# Patient Record
Sex: Male | Born: 1935 | Race: White | Hispanic: No | Marital: Married | Smoking: Former smoker
Health system: Southern US, Community
[De-identification: ages and names within clinical notes are randomized; demographics above are authoritative.]

## PROBLEM LIST (undated history)

## (undated) DIAGNOSIS — I517 Cardiomegaly: Secondary | ICD-10-CM

## (undated) DIAGNOSIS — M5136 Other intervertebral disc degeneration, lumbar region: Secondary | ICD-10-CM

## (undated) DIAGNOSIS — R0609 Other forms of dyspnea: Secondary | ICD-10-CM

## (undated) DIAGNOSIS — N400 Enlarged prostate without lower urinary tract symptoms: Secondary | ICD-10-CM

## (undated) DIAGNOSIS — J449 Chronic obstructive pulmonary disease, unspecified: Secondary | ICD-10-CM

## (undated) DIAGNOSIS — L570 Actinic keratosis: Secondary | ICD-10-CM

## (undated) DIAGNOSIS — I10 Essential (primary) hypertension: Secondary | ICD-10-CM

## (undated) DIAGNOSIS — Z87891 Personal history of nicotine dependence: Secondary | ICD-10-CM

## (undated) DIAGNOSIS — E785 Hyperlipidemia, unspecified: Secondary | ICD-10-CM

## (undated) DIAGNOSIS — Z87442 Personal history of urinary calculi: Secondary | ICD-10-CM

## (undated) DIAGNOSIS — R609 Edema, unspecified: Secondary | ICD-10-CM

## (undated) DIAGNOSIS — I700 Atherosclerosis of aorta: Secondary | ICD-10-CM

## (undated) DIAGNOSIS — J189 Pneumonia, unspecified organism: Secondary | ICD-10-CM

## (undated) DIAGNOSIS — R600 Localized edema: Secondary | ICD-10-CM

## (undated) DIAGNOSIS — M1990 Unspecified osteoarthritis, unspecified site: Secondary | ICD-10-CM

## (undated) DIAGNOSIS — Z7901 Long term (current) use of anticoagulants: Secondary | ICD-10-CM

## (undated) DIAGNOSIS — M51369 Other intervertebral disc degeneration, lumbar region without mention of lumbar back pain or lower extremity pain: Secondary | ICD-10-CM

## (undated) DIAGNOSIS — N529 Male erectile dysfunction, unspecified: Secondary | ICD-10-CM

## (undated) DIAGNOSIS — K5790 Diverticulosis of intestine, part unspecified, without perforation or abscess without bleeding: Secondary | ICD-10-CM

## (undated) DIAGNOSIS — I509 Heart failure, unspecified: Secondary | ICD-10-CM

## (undated) DIAGNOSIS — R972 Elevated prostate specific antigen [PSA]: Secondary | ICD-10-CM

## (undated) DIAGNOSIS — R001 Bradycardia, unspecified: Secondary | ICD-10-CM

## (undated) DIAGNOSIS — I4891 Unspecified atrial fibrillation: Secondary | ICD-10-CM

## (undated) DIAGNOSIS — I2510 Atherosclerotic heart disease of native coronary artery without angina pectoris: Secondary | ICD-10-CM

## (undated) HISTORY — PX: CORONARY ARTERY BYPASS GRAFT: SHX141

## (undated) HISTORY — DX: Heart failure, unspecified: I50.9

## (undated) HISTORY — PX: CHOLECYSTECTOMY: SHX55

## (undated) HISTORY — PX: BYPASS AXILLA/BRACHIAL ARTERY: SHX6426

## (undated) HISTORY — DX: Actinic keratosis: L57.0

## (undated) HISTORY — PX: HIP SURGERY: SHX245

---

## 2004-12-11 HISTORY — PX: TOTAL HIP ARTHROPLASTY: SHX124

## 2005-10-25 ENCOUNTER — Ambulatory Visit: Payer: Self-pay | Admitting: Orthopaedic Surgery

## 2005-11-24 ENCOUNTER — Inpatient Hospital Stay: Payer: Self-pay | Admitting: General Practice

## 2007-10-17 ENCOUNTER — Ambulatory Visit: Payer: Self-pay | Admitting: Internal Medicine

## 2007-10-23 ENCOUNTER — Ambulatory Visit: Payer: Self-pay | Admitting: Internal Medicine

## 2009-05-26 ENCOUNTER — Other Ambulatory Visit: Payer: Self-pay | Admitting: Ophthalmology

## 2011-12-26 ENCOUNTER — Emergency Department: Payer: Self-pay | Admitting: *Deleted

## 2011-12-26 LAB — COMPREHENSIVE METABOLIC PANEL
Albumin: 4.5 g/dL (ref 3.4–5.0)
Alkaline Phosphatase: 59 U/L (ref 50–136)
Bilirubin,Total: 0.6 mg/dL (ref 0.2–1.0)
Calcium, Total: 8.7 mg/dL (ref 8.5–10.1)
Co2: 29 mmol/L (ref 21–32)
Creatinine: 1.18 mg/dL (ref 0.60–1.30)
EGFR (Non-African Amer.): >60
Glucose: 131 mg/dL — ABNORMAL HIGH (ref 65–99)
Osmolality: 294 (ref 275–301)
SGPT (ALT): 16 U/L
Sodium: 144 mmol/L (ref 136–145)
Total Protein: 6.9 g/dL (ref 6.4–8.2)

## 2011-12-26 LAB — CBC
HCT: 42.9 % (ref 40.0–52.0)
HGB: 14.3 g/dL (ref 13.0–18.0)
MCV: 91 fL (ref 80–100)
RDW: 14.2 % (ref 11.5–14.5)
WBC: 11.9 10*3/uL — ABNORMAL HIGH (ref 3.8–10.6)

## 2011-12-26 LAB — TROPONIN I: Troponin-I: <0.02 ng/mL

## 2012-05-02 ENCOUNTER — Inpatient Hospital Stay: Payer: Self-pay | Admitting: Cardiology

## 2012-05-02 ENCOUNTER — Other Ambulatory Visit: Payer: Self-pay | Admitting: Cardiology

## 2012-05-02 DIAGNOSIS — I214 Non-ST elevation (NSTEMI) myocardial infarction: Secondary | ICD-10-CM

## 2012-05-02 HISTORY — DX: Non-ST elevation (NSTEMI) myocardial infarction: I21.4

## 2012-05-02 LAB — CBC WITH DIFFERENTIAL/PLATELET
Basophil #: 0.1 10*3/uL (ref 0.0–0.1)
Basophil: 1 %
Comment - H1-Com1: NORMAL
Eosinophil #: 0.2 10*3/uL (ref 0.0–0.7)
Eosinophil %: 1.7 %
Eosinophil: 1 %
HCT: 44.3 % (ref 40.0–52.0)
HGB: 15 g/dL (ref 13.0–18.0)
Lymphocyte #: 2 10*3/uL (ref 1.0–3.6)
Lymphocyte %: 21.2 %
Lymphocytes: 18 %
MCHC: 33.8 g/dL (ref 32.0–36.0)
MCV: 88 fL (ref 80–100)
Monocyte #: 0.6 x10 3/mm (ref 0.2–1.0)
Monocyte %: 5.9 %
Monocytes: 7 %
Neutrophil #: 6.6 10*3/uL — ABNORMAL HIGH (ref 1.4–6.5)
Neutrophil %: 70.3 %
Platelet: 273 10*3/uL (ref 150–440)
RDW: 14.1 % (ref 11.5–14.5)
Variant Lymphocyte - H1-Rlymph: 9 %
WBC: 9.4 10*3/uL (ref 3.8–10.6)

## 2012-05-02 LAB — TROPONIN I
Troponin-I: 0.52 ng/mL — ABNORMAL HIGH
Troponin-I: 1.3 ng/mL — ABNORMAL HIGH

## 2012-05-02 LAB — CK TOTAL AND CKMB (NOT AT ARMC): CK-MB: 15.5 ng/mL — ABNORMAL HIGH (ref 0.5–3.6)

## 2012-05-03 DIAGNOSIS — I5030 Unspecified diastolic (congestive) heart failure: Secondary | ICD-10-CM

## 2012-05-03 DIAGNOSIS — I2510 Atherosclerotic heart disease of native coronary artery without angina pectoris: Secondary | ICD-10-CM

## 2012-05-03 HISTORY — DX: Unspecified diastolic (congestive) heart failure: I50.30

## 2012-05-03 HISTORY — DX: Atherosclerotic heart disease of native coronary artery without angina pectoris: I25.10

## 2012-05-03 LAB — CBC WITH DIFFERENTIAL/PLATELET
Basophil #: 0.1 10*3/uL (ref 0.0–0.1)
Eosinophil %: 3.3 %
Eosinophil: 2 %
HCT: 43.2 % (ref 40.0–52.0)
HGB: 14.1 g/dL (ref 13.0–18.0)
Lymphocyte %: 18.9 %
Lymphocytes: 22 %
MCH: 29.2 pg (ref 26.0–34.0)
MCHC: 32.7 g/dL (ref 32.0–36.0)
Monocyte %: 9.5 %
Monocytes: 9 %
Neutrophil #: 6.6 10*3/uL — ABNORMAL HIGH (ref 1.4–6.5)
Neutrophil %: 67.6 %
Platelet: 252 10*3/uL (ref 150–440)
RBC: 4.84 10*6/uL (ref 4.40–5.90)
RDW: 14.1 % (ref 11.5–14.5)

## 2012-05-03 LAB — BASIC METABOLIC PANEL
Anion Gap: 8 (ref 7–16)
Chloride: 106 mmol/L (ref 98–107)
Co2: 29 mmol/L (ref 21–32)
EGFR (African American): >60
Glucose: 89 mg/dL (ref 65–99)
Osmolality: 286 (ref 275–301)
Potassium: 3.7 mmol/L (ref 3.5–5.1)

## 2012-05-13 DIAGNOSIS — Z951 Presence of aortocoronary bypass graft: Secondary | ICD-10-CM

## 2012-05-13 HISTORY — DX: Presence of aortocoronary bypass graft: Z95.1

## 2012-05-13 HISTORY — PX: OTHER SURGICAL HISTORY: SHX169

## 2012-06-14 ENCOUNTER — Encounter: Payer: Self-pay | Admitting: Cardiology

## 2012-07-11 ENCOUNTER — Encounter: Payer: Self-pay | Admitting: Cardiology

## 2014-02-05 ENCOUNTER — Inpatient Hospital Stay: Payer: Self-pay | Admitting: Internal Medicine

## 2014-02-05 LAB — COMPREHENSIVE METABOLIC PANEL
ALT: 253 U/L — AB (ref 12–78)
ANION GAP: 5 — AB (ref 7–16)
AST: 181 U/L — AB (ref 15–37)
Albumin: 3.8 g/dL (ref 3.4–5.0)
Alkaline Phosphatase: 212 U/L — ABNORMAL HIGH
BUN: 14 mg/dL (ref 7–18)
Bilirubin,Total: 5.4 mg/dL — ABNORMAL HIGH (ref 0.2–1.0)
CHLORIDE: 106 mmol/L (ref 98–107)
CREATININE: 1.02 mg/dL (ref 0.60–1.30)
Calcium, Total: 8.3 mg/dL — ABNORMAL LOW (ref 8.5–10.1)
Co2: 27 mmol/L (ref 21–32)
EGFR (African American): >60
EGFR (Non-African Amer.): >60
GLUCOSE: 108 mg/dL — AB (ref 65–99)
OSMOLALITY: 277 (ref 275–301)
POTASSIUM: 3.6 mmol/L (ref 3.5–5.1)
Sodium: 138 mmol/L (ref 136–145)
TOTAL PROTEIN: 6.6 g/dL (ref 6.4–8.2)

## 2014-02-05 LAB — URINALYSIS, COMPLETE
Bacteria: NONE SEEN
Blood: NEGATIVE
Glucose,UR: NEGATIVE mg/dL (ref 0–75)
Ketone: NEGATIVE
Leukocyte Esterase: NEGATIVE
NITRITE: NEGATIVE
Ph: 5 (ref 4.5–8.0)
Protein: NEGATIVE
RBC,UR: 1 /HPF (ref 0–5)
Specific Gravity: 1.015 (ref 1.003–1.030)
WBC UR: 1 /HPF (ref 0–5)

## 2014-02-05 LAB — CBC
HCT: 44.7 % (ref 40.0–52.0)
HGB: 15.3 g/dL (ref 13.0–18.0)
MCH: 30.8 pg (ref 26.0–34.0)
MCHC: 34.3 g/dL (ref 32.0–36.0)
MCV: 90 fL (ref 80–100)
Platelet: 232 10*3/uL (ref 150–440)
RBC: 4.97 10*6/uL (ref 4.40–5.90)
RDW: 14.3 % (ref 11.5–14.5)
WBC: 9.2 10*3/uL (ref 3.8–10.6)

## 2014-02-05 LAB — TROPONIN I: Troponin-I: <0.02 ng/mL

## 2014-02-05 LAB — LIPASE, BLOOD: Lipase: >10000 U/L — ABNORMAL HIGH (ref 73–393)

## 2014-02-06 LAB — COMPREHENSIVE METABOLIC PANEL
ALBUMIN: 3.4 g/dL (ref 3.4–5.0)
Alkaline Phosphatase: 223 U/L — ABNORMAL HIGH
Anion Gap: 10 (ref 7–16)
BUN: 11 mg/dL (ref 7–18)
Bilirubin,Total: 6.4 mg/dL — ABNORMAL HIGH (ref 0.2–1.0)
CHLORIDE: 105 mmol/L (ref 98–107)
Calcium, Total: 8.4 mg/dL — ABNORMAL LOW (ref 8.5–10.1)
Co2: 25 mmol/L (ref 21–32)
Creatinine: 0.95 mg/dL (ref 0.60–1.30)
EGFR (Non-African Amer.): >60
Glucose: 72 mg/dL (ref 65–99)
OSMOLALITY: 277 (ref 275–301)
Potassium: 3.5 mmol/L (ref 3.5–5.1)
SGOT(AST): 144 U/L — ABNORMAL HIGH (ref 15–37)
SGPT (ALT): 208 U/L — ABNORMAL HIGH (ref 12–78)
SODIUM: 140 mmol/L (ref 136–145)
TOTAL PROTEIN: 6.2 g/dL — AB (ref 6.4–8.2)

## 2014-02-06 LAB — PROTIME-INR
INR: 1.1
PROTHROMBIN TIME: 14.1 s (ref 11.5–14.7)

## 2014-02-06 LAB — CBC WITH DIFFERENTIAL/PLATELET
BASOS PCT: 0.7 %
Basophil #: 0 10*3/uL (ref 0.0–0.1)
EOS ABS: 0.1 10*3/uL (ref 0.0–0.7)
EOS PCT: 1.8 %
HCT: 42.2 % (ref 40.0–52.0)
HGB: 14.1 g/dL (ref 13.0–18.0)
Lymphocyte #: 1 10*3/uL (ref 1.0–3.6)
Lymphocyte %: 15.8 %
MCH: 29.8 pg (ref 26.0–34.0)
MCHC: 33.5 g/dL (ref 32.0–36.0)
MCV: 89 fL (ref 80–100)
MONO ABS: 0.9 x10 3/mm (ref 0.2–1.0)
Monocyte %: 14.6 %
NEUTROS ABS: 4.1 10*3/uL (ref 1.4–6.5)
NEUTROS PCT: 67.1 %
PLATELETS: 223 10*3/uL (ref 150–440)
RBC: 4.74 10*6/uL (ref 4.40–5.90)
RDW: 14.1 % (ref 11.5–14.5)
WBC: 6.1 10*3/uL (ref 3.8–10.6)

## 2014-02-06 LAB — LIPASE, BLOOD: LIPASE: 1445 U/L — AB (ref 73–393)

## 2014-02-06 LAB — MAGNESIUM: Magnesium: 1.8 mg/dL

## 2014-02-06 LAB — BILIRUBIN, DIRECT: Bilirubin, Direct: 4.6 mg/dL — ABNORMAL HIGH (ref 0.00–0.20)

## 2014-02-07 LAB — CBC WITH DIFFERENTIAL/PLATELET
BASOS PCT: 0.7 %
Basophil #: 0 10*3/uL (ref 0.0–0.1)
EOS ABS: 0.2 10*3/uL (ref 0.0–0.7)
Eosinophil %: 2.6 %
HCT: 39.7 % — AB (ref 40.0–52.0)
HGB: 13.9 g/dL (ref 13.0–18.0)
Lymphocyte #: 0.9 10*3/uL — ABNORMAL LOW (ref 1.0–3.6)
Lymphocyte %: 12.6 %
MCH: 31.4 pg (ref 26.0–34.0)
MCHC: 35.1 g/dL (ref 32.0–36.0)
MCV: 89 fL (ref 80–100)
Monocyte #: 1 x10 3/mm (ref 0.2–1.0)
Monocyte %: 13.6 %
NEUTROS ABS: 4.9 10*3/uL (ref 1.4–6.5)
Neutrophil %: 70.5 %
Platelet: 210 10*3/uL (ref 150–440)
RBC: 4.44 10*6/uL (ref 4.40–5.90)
RDW: 14 % (ref 11.5–14.5)
WBC: 7 10*3/uL (ref 3.8–10.6)

## 2014-02-07 LAB — COMPREHENSIVE METABOLIC PANEL
ALT: 172 U/L — AB (ref 12–78)
ANION GAP: 8 (ref 7–16)
Albumin: 3.4 g/dL (ref 3.4–5.0)
Alkaline Phosphatase: 220 U/L — ABNORMAL HIGH
BILIRUBIN TOTAL: 2.8 mg/dL — AB (ref 0.2–1.0)
BUN: 15 mg/dL (ref 7–18)
CALCIUM: 8.3 mg/dL — AB (ref 8.5–10.1)
Chloride: 106 mmol/L (ref 98–107)
Co2: 25 mmol/L (ref 21–32)
Creatinine: 0.98 mg/dL (ref 0.60–1.30)
EGFR (African American): >60
EGFR (Non-African Amer.): >60
Glucose: 69 mg/dL (ref 65–99)
OSMOLALITY: 277 (ref 275–301)
POTASSIUM: 3.6 mmol/L (ref 3.5–5.1)
SGOT(AST): 123 U/L — ABNORMAL HIGH (ref 15–37)
Sodium: 139 mmol/L (ref 136–145)
TOTAL PROTEIN: 6.2 g/dL — AB (ref 6.4–8.2)

## 2014-02-07 LAB — LIPASE, BLOOD: Lipase: 642 U/L — ABNORMAL HIGH (ref 73–393)

## 2014-02-08 LAB — COMPREHENSIVE METABOLIC PANEL
Albumin: 3.2 g/dL — ABNORMAL LOW (ref 3.4–5.0)
Alkaline Phosphatase: 180 U/L — ABNORMAL HIGH
Anion Gap: 5 — ABNORMAL LOW (ref 7–16)
BILIRUBIN TOTAL: 1.8 mg/dL — AB (ref 0.2–1.0)
BUN: 15 mg/dL (ref 7–18)
Calcium, Total: 8 mg/dL — ABNORMAL LOW (ref 8.5–10.1)
Chloride: 111 mmol/L — ABNORMAL HIGH (ref 98–107)
Co2: 27 mmol/L (ref 21–32)
Creatinine: 0.88 mg/dL (ref 0.60–1.30)
Glucose: 78 mg/dL (ref 65–99)
Osmolality: 285 (ref 275–301)
Potassium: 3.4 mmol/L — ABNORMAL LOW (ref 3.5–5.1)
SGOT(AST): 104 U/L — ABNORMAL HIGH (ref 15–37)
SGPT (ALT): 144 U/L — ABNORMAL HIGH (ref 12–78)
Sodium: 143 mmol/L (ref 136–145)
Total Protein: 5.7 g/dL — ABNORMAL LOW (ref 6.4–8.2)

## 2014-02-08 LAB — LIPASE, BLOOD: Lipase: 649 U/L — ABNORMAL HIGH (ref 73–393)

## 2014-02-12 LAB — PATHOLOGY REPORT

## 2015-04-03 NOTE — Consult Note (Signed)
Pt lipase down from 10,000 to 1450.  MRCP shows CBD stone and thickened gall bladder wall. For ERCP today with Dr. Candace Cruise.  Pt said Dr. Marina Gravel to be consullted.  I will be off this weekend and Dr. Candace Cruise will cover.  Electronic Signatures: Manya Silvas (MD)  (Signed on 27-Feb-15 13:11)  Authored  Last Updated: 27-Feb-15 13:11 by Manya Silvas (MD)

## 2015-04-03 NOTE — Consult Note (Signed)
PATIENT NAME:  David Hardin, David Hardin MR#:  [id number] DATE OF BIRTH:  October 14, 1927  DATE OF CONSULTATION:  02/05/2014  CONSULTING PHYSICIAN:  Manya Silvas, MD  The patient is an 81 year old white male well known to me who is in very good health for his age. He had the onset of a few episodes of pain off and on that would come and go every few days for the last month or so. Also would occur after eating. He developed epigastric pain and pain went into his back, and it was very significant and continuous. His previous bouts of pain had resolved usually within an hour or so. This one stayed for several hours. He came to the ER where he was found to have gallstone pancreatitis and was admitted to the hospital. I was asked to see him in consultation.   At this moment in time, hours after admission, he has no abdominal pain whatsoever and feels much better.   PAST MEDICAL HISTORY: 1.  Hypertension.  2.  Hyperlipidemia.  3.  GERD.   PAST SURGICAL HISTORY: Coronary bypass graft surgery 05/13/2012, left total hip replacement, and cataract surgery.   ALLERGIES: No known drug allergies.   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   FAMILY HISTORY: Positive for heart disease.   MEDICATIONS: Metoprolol 25 mg p.o. b.i.d., Lipitor 20 mg a day, amlodipine 5 mg a day.   REVIEW OF SYSTEMS: Covered by the hospitalist's admission note. Denies any chest pain. Denies any asthma or wheezing. No nausea or vomiting. No chills or fever.   PHYSICAL EXAMINATION: VITAL SIGNS: Temperature 98.2, pulse 68, blood pressure 167/81, pulse oximetry 98% on room air.  HEENT: Sclerae are slightly icteric. Conjunctivae negative. Tongue negative. The head is atraumatic.  CHEST: Clear.  HEART: No murmurs, gallops, clicks, or rubs.  ABDOMEN: Bowel sounds present. No hepatosplenomegaly. No masses. No bruits. No epigastric tenderness.  SKIN: Warm and dry.  PSYCHIATRIC: Mood and affect are appropriate.    LABORATORY AND RADIOLOGICAL DATA: Lipase greater than 10,000. Calcium 8.3. Glucose 108, BUN 14, creatinine 1, sodium 138, potassium 3.6, chloride 106, CO2 of 27, alkaline phosphatase 212, SGOT 181, SGPT 253, total bilirubin 5.4, albumin 3.8, total protein 6.6. White count 9.2, hemoglobin 15.3, platelet count 232. Urinalysis shows 1+ bilirubin. Ultrasound of the abdomen shows a dilated common bile duct, nonmobile echogenic foci noted with associated gallbladder wall thickening, and sludge and multiple echogenic mobile foci noted in the sludge; this is consistent with stones. CAT scan of the abdomen shows gallstone, some gallbladder wall thickening, mild increased caliber of the common bile duct, cannot rule out a small stone within the ampulla.   ASSESSMENT: Gallstones with probable gallstone pancreatitis. He may have passed a stone, which would explain why he is having no significant pain at this time or tenderness.   PLAN: Is to get an MRCP in the morning, repeat liver panel in the morning, and if he has evidence of common bile duct stone, will get ERCP, stone removal, and surgical consultation.   ____________________________ Manya Silvas, MD rte:jcm D: 02/05/2014 20:37:21 ET T: 02/05/2014 20:50:40 ET JOB#: [id number]  cc: Manya Silvas, MD, <Dictator> Vipul S. Manuella Ghazi, MD Manya Silvas MD ELECTRONICALLY SIGNED 02/19/2014 16:40

## 2015-04-03 NOTE — Discharge Summary (Signed)
PATIENT NAME:  David Hardin, David Hardin MR#:  [id number] DATE OF BIRTH:  [birth year]  DATE OF ADMISSION:  02/05/2014 DATE OF DISCHARGE:  02/08/2014  PRIMARY CARE PHYSICIAN: Dr. Baldemar Lenis  CONSULTANTS: Gastroenterology, Dr. Candace Cruise; surgery, Dr. Marina Gravel.  PROCEDURE: Laparoscopic cholecystectomy.   DISCHARGE DIAGNOSES: 1.  Choledocholithiasis. 2.  Gallstone pancreatitis.  3.  Hypertension.  4.  Hyperlipidemia.   CONDITION: Stable.   CODE STATUS: FULL.  DISCHARGE MEDICATIONS: Please refer to the [facility] physician discharge instruction medication reconciliation list.   DISCHARGE DIET: Low-sodium, low-fat, low-cholesterol.  DISCHARGE ACTIVITY: As tolerated.   FOLLOWUP CARE: Follow up with PCP within 1 to 2 weeks. Follow up with Dr. Sherri Rad in 1 to 2 weeks.  REASON FOR ADMISSION: Abdominal pain.   HOSPITAL COURSE: The patient is an 81 year old Caucasian male with a history of hypertension and hyperlipidemia who presented to the ED with abdominal pain for 1 week. The patient was noted to have elevated lipase, more than 10,000, and elevated liver function tests. For detailed history and physical examination, please refer to the admission note dictated by Dr. Manuella Ghazi. On admission date, abdominal ultrasound showed 9.9 mm CBD sludge and multiple stones with gallbladder wall thickening up to 3.5 mm. Cholecystitis could not be excluded.   Acute gallstone pancreatitis: After admission, the patient was kept n.p.o. with IV fluid support and pain management. The patient's abdominal pain has improved. The ERCP  showed calculus without cholecystitis. ERCP also showed single distal CBD stone that was extracted. After biliary therapy and balloon extraction, the patient's liver function has been improving. Dr. Marina Gravel suggests the patient has choledocholithiasis and did lap cholecystectomy today. He suggested the patient may be discharged to home after the surgery. The patient has no complaints after surgery. The patient's lipase  decreased to 49 today. He is clinically stable. He will be discharged to home today. I discussed the patient's discharge plan with the patient, the patient's wife and daughter, and Dr. Marina Gravel.   TIME SPENT: About 38 minutes.   ____________________________ Demetrios Loll, MD qc:sb D: 02/08/2014 13:44:50 ET T: 02/09/2014 08:05:32 ET JOB#: [id number]  cc: Demetrios Loll, MD, <Dictator> Demetrios Loll MD ELECTRONICALLY SIGNED 02/09/2014 17:45

## 2015-04-03 NOTE — Consult Note (Signed)
Pt had cholecystectomy this AM. Sleepy from anesthesia. No other complaints. LFT continues to improve. Will be discharged to home today. Will sign off. THanks  Electronic Signatures: Verdie Shire (MD)  (Signed on 01-Mar-15 11:43)  Authored  Last Updated: 01-Mar-15 11:43 by Verdie Shire (MD)

## 2015-04-03 NOTE — H&P (Signed)
PATIENT NAME:  David Hardin, David Hardin MR#:  [id number] DATE OF BIRTH:  1927/03/27  DATE OF ADMISSION:  02/05/2014  PRIMARY CARE PHYSICIAN:  Dr. Baldemar Lenis  REFERRING PHYSICIAN:  Dr. Benjaman Lobe   CHIEF COMPLAINT: Abdominal pain.   HISTORY OF PRESENT ILLNESS: The patient is an 81 year old male with a known history of hypertension and coronary artery disease, is being admitted for possible gallstone pancreatitis. The patient started having abdominal pain, which started for about a week, on and off, mainly in the epigastric region. He was thinking it had been reflux, but was not getting resolved, and family forced him to come to the Emergency Department. While in the ED, he was found to have a lipase more than 10,000, and elevated LFTs, for which he is requested to be admitted for further evaluation and management. The patient does not have any pain at this time, and is requesting food, as he is very hungry.  PAST MEDICAL HISTORY: 1.  Hypertension.  2.  Hyperlipidemia.  3.  GERD.    PAST SURGICAL HISTORY:  1.  CABG.  2.  Left total hip replacement.  3.  Cataract surgery.   ALLERGIES: No known drug allergies.   SOCIAL HISTORY: He is married. No alcohol use. 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    FAMILY HISTORY: Father died at the age of 79 from heart failure.  MEDICATIONS AT HOME: 1.  Vitamin once daily. 2.  Metoprolol 25 mg p.o. b.i.d.  3.  Lipitor 20 mg p.o. daily.  4.  Amlodipine 5 mg p.o. daily.   REVIEW OF SYSTEMS: CONSTITUTIONAL: No fever, fatigue, weakness. EYES:  No blurred or double vision. ENT: No tinnitus or ear pain.  RESPIRATORY: No cough, wheezing, hemoptysis.  CARDIOVASCULAR: No chest pain, orthopnea, edema.  GASTROINTESTINAL: No nausea or vomiting. Positive abdominal pain, which is improving. GENITOURINARY:  No dysuria or hematuria. ENDOCRINE: No polyuria or nocturia.  HEMATOLOGY:  No (Dictation Anomaly)  bruising.  SKIN: No rash or lesion.  MUSCULOSKELETAL: No arthritis or muscle cramp.  NEUROLOGIC: No tingling, numbness, weakness.  PSYCHIATRIC: No history of anxiety or depression.   PHYSICAL EXAMINATION: VITAL SIGNS: Temperature 97.5, heart rate 73 per minute, respirations 18 per minute, blood pressure 160/74 mmHg, he is saturating 96% on room air.  GENERAL: The patient is an 81 year old male lying in the bed comfortably, without any acute distress.  EYES: Pupils equal, round, reactive to light. Corneas show no scleral icterus. Extraocular muscles intact.  HEENT: Head atraumatic, normocephalic. Oropharynx and nasopharynx clear.  NECK: Supple. No jugular venous distention.  LUNGS: Clear to auscultation bilaterally. No wheezing, rales, rhonchi, crepitation.  CARDIOVASCULAR: S1, S2 normal. No murmurs.   ABDOMEN: Soft, obese, nontender, nondistended. Bowel sounds present. No organomegaly or mass.  EXTREMITIES: No pedal edema, cyanosis or clubbing.  NEUROLOGIC: Nonfocal examination. Cranial nerves II through XII intact. Muscle strength 5/5 in all extremities. Sensation intact. PSYCHIATRIC:  The patient is alert and oriented x 3.  SKIN: No obvious rash, lesion, ulcer.  LABORATORY PANEL: Normal BMP, except lipase of more than 10,000. LFTs showed alkaline phosphatase of 212, total bilirubin of 5.4, AST 181, ALT 253. Normal CBC. Negative troponin. Negative UA.  CT scan of the abdomen and pelvis in the ED showed normal stomach, multiple distal colonic diverticula, no acute pathology, mild hepatic steatosis and stone within the gallbladder seen, CBD measuring up to 9 mm.   Chest x-ray showed no acute cardiopulmonary disease.  Abdominal ultrasound on February 26 showed 9.9 mm CBD, sludge and multiple gallstones seen, multiple echogenic non-mobile foci present, with gallbladder wall thickening up to 3.5 mm, cholecystitis cannot be excluded.   EKG showed sinus rhythm, no major ST-T changes.    IMPRESSION AND PLAN: 1.  Gallstone pancreatitis. Will get GI and Surgery consult. Check amylase and lipase again in the morning. Obtain LFTs and CBC recheck. Also get MRI. Start him on clear liquid diet, and will advance  as tolerated.   2.  Hypertension. Will resume his metoprolol.  3.  Hyperlipidemia. Continue Lipitor.   4.  Elevated liver function tests, likely due to gallstone. Will monitor.   5.  Code status:  FULL CODE.   Total time taking care of this patient is 40 minutes.    ____________________________ Lucina Mellow. Manuella Ghazi, MD vss:mr D: 02/05/2014 18:57:50 ET T: 02/05/2014 19:15:37 ET JOB#: [id number]  cc: Lonetta Blassingame S. Manuella Ghazi, MD, <Dictator> Derinda Late, MD  Lucina Mellow [facility] MD ELECTRONICALLY SIGNED 02/11/2014 21:32

## 2015-04-03 NOTE — Op Note (Signed)
PATIENT NAME:  David Hardin, David Hardin MR#:  [id number] DATE OF BIRTH:  [birth year]  DATE OF PROCEDURE:  02/08/2014  PREOPERATIVE DIAGNOSIS: Choledocholithiasis and biliary pancreatitis with cholelithiasis.   POSTOPERATIVE DIAGNOSIS: Choledocholithiasis and biliary pancreatitis with cholelithiasis.   PROCEDURE PERFORMED: Laparoscopic cholecystectomy.   SURGEON: Asmaa Tirpak A. Marina Gravel, M.D.   ASSISTANT: None.   ANESTHESIA: General endotracheal.   FINDINGS: Stones and what appeared to be chronic cholecystitis.   SPECIMENS: Gallbladder with contents to pathology.   ESTIMATED BLOOD LOSS: Minimal.   DESCRIPTION OF PROCEDURE: With informed consent obtained from the patient, he was brought to the operating room and positioned supine. General oroendotracheal anesthesia was induced. The patient's left arm was padded and tucked at his side. His abdomen was widely prepped and draped with ChloraPrep solution. Timeout was observed. A 12 mm blunt Hassan trocar was placed through an open technique through an infraumbilical transversely oriented skin incision with stay sutures being passed through the fascia. Pneumoperitoneum was established. The patient was then positioned in reverse Trendelenburg and airplane right side up. A 5 mm bladeless trocar was placed in the epigastric region followed by two 5 mm ports in the right subcostal margin laterally. Gallbladder was then grasped along its fundus, elevated towards the right shoulder and laterally, Hartmann pouch was identified, lateral traction was applied. The hepatoduodenal ligament was involved in a chronic scarified reaction. Dissection demonstrated single cystic artery and single cystic duct. A critical view of safety cholecystectomy was achieved. The cystic artery appeared to be anterior and slightly lateral to the cystic duct. The cystic artery was doubly clipped on the portal side, singly clipped on the gallbladder side, and divided. The cystic duct was triply clipped on  the portal side, singly clipped on the gallbladder side, and divided. One small peritoneal band which appeared to contain a small blood vessel was divided with single hemoclips and sharp scissors. Further dissection in this area demonstrated no evidence of aberrant bile duct or artery. The gallbladder was then retrieved off the gallbladder fossa utilizing hook cautery apparatus, placed into an Endo Catch device and retrieved. During the retrieving process, a 5 mm operating trocar was inserted in the epigastric port demonstrating no evidence of bowel injury or bleeding from the umbilical trocar site. The right upper quadrant was irrigated with approximately 1 liter of normal saline during the completion of the operation and hemostasis appeared to be adequate, fluid was aspirated. Ports were then removed under direct visualization. A total of 30 mL of 0.25% plain Marcaine was infiltrated along all skin and fascial incisions prior to closure. The infraumbilical fascial defect was closed with an additional figure-of-eight #0 Vicryl suture in vertical orientation. The existing stay sutures were tied to each other. 4-0 Vicryl subcuticular was applied to all skin edges followed by benzoin, Steri-Strips, Telfa, and Tegaderm. The patient was subsequently extubated and taken to the recovery room in stable and satisfactory condition by anesthesia services.  ____________________________ Jeannette How Marina Gravel, MD mab:sb D: 02/08/2014 14:12:50 ET T: 02/09/2014 08:42:12 ET JOB#: [id number]  cc: Elta Guadeloupe A. Marina Gravel, MD, <Dictator> Stanly Si A Jamicah Anstead MD ELECTRONICALLY SIGNED 02/11/2014 11:02

## 2015-04-03 NOTE — Consult Note (Signed)
Chief Complaint:  Subjective/Chief Complaint Feeling much better. Just ate breakfast. So far, ok. LFT and lipase better.   VITAL SIGNS/ANCILLARY NOTES: **Vital Signs.:   28-Feb-15 05:19  Vital Signs Type Routine  Temperature Temperature (F) 97.9  Celsius 36.6  Temperature Source oral  Pulse Pulse 71  Respirations Respirations 17  Systolic BP Systolic BP 353  Diastolic BP (mmHg) Diastolic BP (mmHg) 83  Mean BP 108  Pulse Ox % Pulse Ox % 97  Pulse Ox Activity Level  At rest  Oxygen Delivery Room Air/ 21 %   Brief Assessment:  GEN no acute distress   Cardiac Regular   Respiratory clear BS   Gastrointestinal Normal   Lab Results: Hepatic:  28-Feb-15 04:20   Bilirubin, Total  2.8  Alkaline Phosphatase  220 (45-117 NOTE: New Reference Range 10/31/13)  SGPT (ALT)  172  SGOT (AST)  123  Total Protein, Serum  6.2  Albumin, Serum 3.4  Routine Chem:  28-Feb-15 04:20   Lipase  642 (Result(s) reported on 07 Feb 2014 at 05:15AM.)  Glucose, Serum 69  BUN 15  Creatinine (comp) 0.98  Sodium, Serum 139  Potassium, Serum 3.6  Chloride, Serum 106  CO2, Serum 25  Calcium (Total), Serum  8.3  Osmolality (calc) 277  eGFR (African American) >60  eGFR (Non-African American) >60 (eGFR values <45m/min/1.73 m2 may be an indication of chronic kidney disease (CKD). Calculated eGFR is useful in patients with stable renal function. The eGFR calculation will not be reliable in acutely ill patients when serum creatinine is changing rapidly. It is not useful in  patients on dialysis. The eGFR calculation may not be applicable to patients at the low and high extremes of body sizes, pregnant women, and vegetarians.)  Anion Gap 8  Routine Hem:  28-Feb-15 04:20   WBC (CBC) 7.0  RBC (CBC) 4.44  Hemoglobin (CBC) 13.9  Hematocrit (CBC)  39.7  MCV 89  MCH 31.4  MCHC 35.1  RDW 14.0  Neutrophil % 70.5  Lymphocyte % 12.6  Monocyte % 13.6  Eosinophil % 2.6  Basophil % 0.7  Neutrophil  # 4.9  Lymphocyte #  0.9  Monocyte # 1.0  Eosinophil # 0.2  Basophil # 0.0 (Result(s) reported on 07 Feb 2014 at 05:09AM.)   Assessment/Plan:  Assessment/Plan:  Assessment Gallstone pancreatitis. CBD stone removed. Labs improving.   Plan For GB surgery tomorrow.   Electronic Signatures: OVerdie Shire(MD)  (Signed 28-Feb-15 10:06)  Authored: Chief Complaint, VITAL SIGNS/ANCILLARY NOTES, Brief Assessment, Lab Results, Assessment/Plan   Last Updated: 28-Feb-15 10:06 by OVerdie Shire(MD)

## 2015-04-03 NOTE — Consult Note (Signed)
ERCP showed single distal CBD stone that was extracted after biliary sphincterotomy and balloon extraction. Keep patient NPO rest of today. Recheck lipase/LFT in AM. thanks.  Electronic Signatures: Verdie Shire (MD)  (Signed on 27-Feb-15 15:29)  Authored  Last Updated: 27-Feb-15 15:29 by Verdie Shire (MD)

## 2015-04-03 NOTE — Consult Note (Signed)
PATIENT NAME:  David Hardin, David Hardin MR#:  [id number] DATE OF BIRTH:  [birth year]  DATE OF CONSULTATION:  02/07/2014  DATE OF DICTATION: 02/07/2014   CONSULTING PHYSICIAN:  Elta Guadeloupe A. Marina Gravel, MD  REASON FOR CONSULTATION: Choledocholithiasis and need for cholecystectomy.   HISTORY: This is a pleasant 81 year old white male with a history of coronary artery disease and high blood pressure, who was admitted to the hospital on the 26th of February with obstructive jaundice and a 1-day history of severe epigastric abdominal pain. In dating his symptoms, he states that he has had intermittent new-onset back pain, which has since resolved, dating back to as far back as 2 weeks prior to his admission. While in the Emergency Room, the patient was found to have markedly elevated lipase and liver function tests including bilirubin.  Since his hospitalization, he has not had any further pain. The patient had a dilated common bile duct seen on ultrasonography. Furthermore, a CT scan of the abdomen and pelvis was performed, demonstrating no significant findings within the pancreas but the bile duct appeared dilated.  Review of an MRCP performed the day after his admission demonstrates a 4 mm solitary stone seen within the distal common bile duct. There was gallbladder wall thickening seen as well An ERCP performed yesterday was successful in extracting the solitary stone. Cholangiography was personally reviewed by me. I did not obviously see a cystic duct on that injection. A sphincterotomy was performed. Biliary tree was swept to 12 mm. Currently, the patient is without any discomfort post ERCP. He is asking for food.  He is interested in surgical therapy to prevent future symptoms and pancreatitis.  ALLERGIES: None.   HOME MEDICATIONS:  1. Amlodipine 5 mg by mouth once a day. 2. Lipitor 20 mg by mouth once a day.  3. Metoprolol 25 mg by mouth b.i.d.   PAST MEDICAL HISTORY: Hypertension and coronary artery disease.   PAST  SURGICAL HISTORY: Left hip arthroplasty and coronary artery bypass grafting through the left chest.   SOCIAL HISTORY: The patient is very active, is involved in professional sports. Does not drink. Is married. Lives in [place].   REVIEW OF SYSTEMS: As described above. 10 point review otherwise negative.  PHYSICAL EXAMINATION:  GENERAL: The patient is a pleasant-appearing white male, much younger than stated age.  VITAL SIGNS: Temperature is 97.9, pulse is 71, respiratory rate of 18, blood pressure is 160/83.  ABDOMEN: Demonstrates it to be soft and nontender with no epigastric tenderness or Murphy sign present.  EXTREMITIES: Warm and well-perfused.  EYES: Sclerae are mildly icteric.   LABORATORY VALUES: Lipase on admission greater than 10,000, today 642. Electrolytes are unremarkable. Bilirubin on admission was 5.4, yesterday 6.4, post ERCP 2.8. Alkaline phosphatase 220, AST 123, which is diminished from admission, ALT 172, diminished from admission. White count today is 7.0, hemoglobin 13.9, platelet count 210,000, normal differential.   IMPRESSION: This is an 81 year old active white male with admitting diagnosis of acute biliary pancreatitis and concomitant choledocholithiasis. He does not have a predating history of what sounds like biliary colic.   RECOMMENDATIONS: As he is currently pain-free and his lipase is diminishing, elective laparoscopic cholecystectomy can be performed tomorrow due to the operating room schedule today, and the patient is agreeable to this. I discussed with him return to full activity in the next 2-3 weeks. I discussed with him the risks of surgery, including that of bleeding, infection, need for conversion to open operation, bile duct injury and leak. All of his  questions were answered. We had a long discussion regarding the procedure. He understands fully and wishes to proceed.   ____________________________ Jeannette How Marina Gravel, MD FACS mab:lb D: 02/07/2014 12:54:37  ET T: 02/07/2014 13:42:14 ET JOB#: [id number]  cc: Elta Guadeloupe A. Marina Gravel, MD, <Dictator> Hortencia Conradi MD ELECTRONICALLY SIGNED 02/07/2014 16:50

## 2015-04-04 NOTE — Discharge Summary (Signed)
PATIENT NAME:  David Hardin, David Hardin MR#:  [id number] DATE OF BIRTH:  Mar 29, 1927  DATE OF ADMISSION:  05/02/2012 DATE OF DISCHARGE:  05/03/2012  FINAL DIAGNOSES:  1. Non-ST elevation myocardial infarction.  2. Coronary artery disease.  3. Hypertension.   PROCEDURE: Cardiac catheterization with selective coronary arteriography on 05/03/2012.   HISTORY OF PRESENT ILLNESS: Please see admission history and physical.   HOSPITAL COURSE: The patient was admitted to telemetry on 05/02/2012 with chest pain and elevated troponin. The patient was treated with topical nitrates and Lovenox without recurrent chest pain. The patient ruled in for non-ST elevation myocardial infarction with troponin of 5.2. The patient underwent cardiac catheterization on 05/03/2012. Coronary arteriography revealed a high-grade 99% stenosis in the mid left anterior descending coronary artery with TIMI 2 flow in the LAD. There was also a 70% stenosis in the ostium of the first diagonal branch with 75% stenosis in the midsegment. In addition, there was a 99% stenosis in the distal RCA. The posterior descending artery appeared to be subtotaled with TIMI 1 flow. I lieu of the complex nature of his coronary artery disease, the patient was transferred to [facility] for complex two-vessel percutaneous coronary intervention. The patient was transferred in stable condition.    ____________________________ Isaias Cowman, MD ap:rbg D: 05/03/2012 11:56:13 ET T: 05/06/2012 10:30:27 ET JOB#: [id number]  cc: Isaias Cowman, MD, <Dictator> Isaias Cowman MD ELECTRONICALLY SIGNED 05/23/2012 17:40

## 2015-04-04 NOTE — H&P (Signed)
PATIENT NAME:  David Hardin, David Hardin MR#:  [id number] DATE OF BIRTH:  1927/07/03  DATE OF ADMISSION:  05/02/2012  PRIMARY CARE PHYSICIAN: Apolonio Schneiders, MD   CHIEF COMPLAINT: Chest pain.   HISTORY OF PRESENT ILLNESS: The patient is an 81 year old gentleman with multiple cardiovascular risk factors admitted with recurrent chest pain and positive cardiac biomarkers. The patient reports a six month history of intermittent episodes of chest discomfort. He underwent stress echocardiogram 01/26/2012 and was able to exercise 10 minutes and 15 seconds on a Bruce protocol without chest pain, ECG changes, or evidence of exercise-induced ischemia by echocardiogram. The patient reports he has been doing well until about four days ago when he noted recurrence of chest pain while walking on a treadmill. The patient had reproducible chest pain on the treadmill. The episode would generally subside with rest although he had some mild residual chest discomfort. EKG was performed which revealed evidence of new ST changes in the anterior leads. Lab work revealed elevated CPK, MB, and troponin consistent with possible non-ST elevation myocardial infarction.   PAST MEDICAL HISTORY: Hypertension.   MEDICATIONS:  1. Aspirin 81 mg daily.  2. Benicar/HCT 40/12.5 mg daily.  3. Amlodipine 5 mg daily.   SOCIAL HISTORY: The patient is married. He denies tobacco abuse. 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   FAMILY HISTORY: Father died at age 62 from congestive heart failure.   REVIEW OF SYSTEMS: CONSTITUTIONAL: No fever or chills. EYES: No blurry vision. EARS: No hearing loss. RESPIRATORY: No shortness of breath. CARDIOVASCULAR: Chest pain as described above. GI: No nausea, vomiting, diarrhea, or constipation. GU: No dysuria or hematuria. MUSCULOSKELETAL: No arthralgias or myalgias. NEUROLOGICAL: No focal muscle weakness or numbness. PSYCHOLOGICAL: No depression or  anxiety.   PHYSICAL EXAMINATION:   VITAL SIGNS: Weight 214.2, height 5 feet 8 inches, BMI 33, blood pressure 142/72 right arm, 132/80 left arm, pulse 58 and regular.   HEENT: Pupils equal and reactive to light and accommodation.   NECK: Supple without thyromegaly.   LUNGS: Clear.   HEART: Normal JVP. Normal PMI. Regular rate and rhythm. Normal S1, S2. No appreciable gallop, murmur, or rub.   ABDOMEN: Soft, nontender. Pulses were intact bilaterally.   MUSCULOSKELETAL: Normal muscle tone.   NEUROLOGIC: The patient is alert and oriented x3. Motor and sensory both grossly intact.   ACCESSORY DATA: EKG reveals sinus rhythm at 57 bpm with nonspecific ST elevations in the precordial leads.   IMPRESSION: This is an 81 year old gentleman with chest pain at rest, new ECG changes, and elevated CPK, MB, and troponin consistent with non-ST elevation myocardial infarction.   RECOMMENDATIONS:  1. Admit to telemetry.  2. Nitro paste 1 inch q.6. 3. Lovenox 1 mg/kg sub-Q q.12.  4. Proceed with cardiac catheterization with selective coronary arteriography on 05/03/2012. Risks, benefits, and alternatives were explained and informed written consent obtained.   ____________________________ Isaias Cowman, MD ap:drc D: 05/02/2012 14:17:48 ET T: 05/02/2012 14:39:25 ET JOB#: [id number] cc: Isaias Cowman, MD, <Dictator>, Vianne Bulls. Arline Asp, MD Isaias Cowman MD ELECTRONICALLY SIGNED 05/23/2012 17:40

## 2015-04-13 ENCOUNTER — Other Ambulatory Visit: Payer: Self-pay | Admitting: Specialist

## 2015-04-13 DIAGNOSIS — M5412 Radiculopathy, cervical region: Secondary | ICD-10-CM

## 2015-04-17 ENCOUNTER — Ambulatory Visit
Admission: RE | Admit: 2015-04-17 | Discharge: 2015-04-17 | Disposition: A | Discharge status: Home / Self Care | Payer: Medicare HMO | Source: Ambulatory Visit | Attending: Specialist | Admitting: Specialist

## 2015-04-17 DIAGNOSIS — M4802 Spinal stenosis, cervical region: Secondary | ICD-10-CM | POA: Diagnosis not present

## 2015-04-17 DIAGNOSIS — M5412 Radiculopathy, cervical region: Secondary | ICD-10-CM

## 2015-04-22 ENCOUNTER — Ambulatory Visit: Payer: Self-pay

## 2015-05-09 IMAGING — CT CT ABD-PELV W/ CM
2 of 5 series · 16 of 46 positions shown, 18 images · IV contrast (agent unspecified)
Comparison: 10/23/2007

CLINICAL DATA: Abdominal pain

EXAM:
CT ABDOMEN AND PELVIS WITH CONTRAST
TECHNIQUE: Multidetector CT imaging of the abdomen and pelvis was performed
using the standard protocol following bolus administration of
intravenous contrast.

[Series 2: routine abd pel with · axial · 0.90mm/px · z∈[-1116,-682]mm · 13 of 99 slices shown, 15 images]
[im 6/99  soft-tissue]
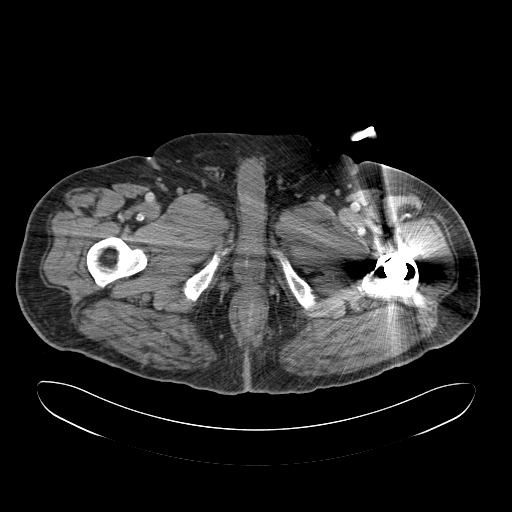
[im 6/99  bone]
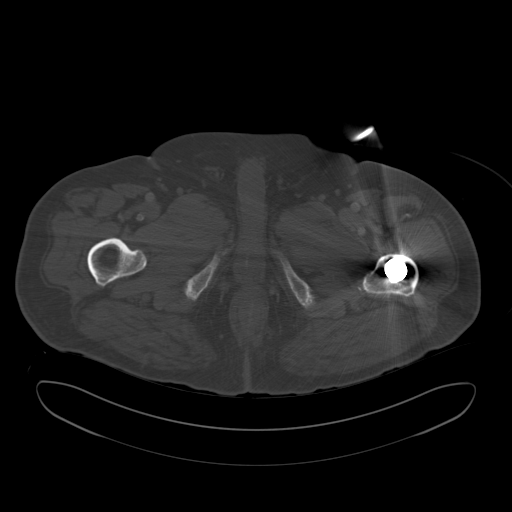
[im 11/99  soft-tissue]
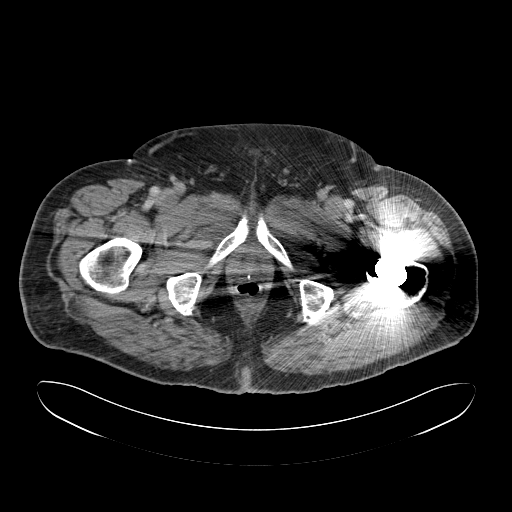
[im 22/99  soft-tissue]
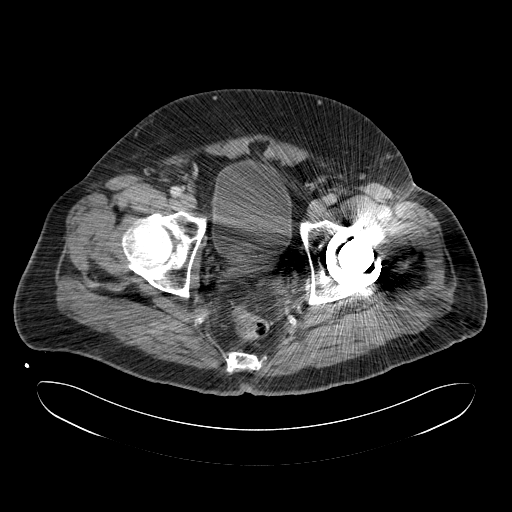
[im 28/99  soft-tissue]
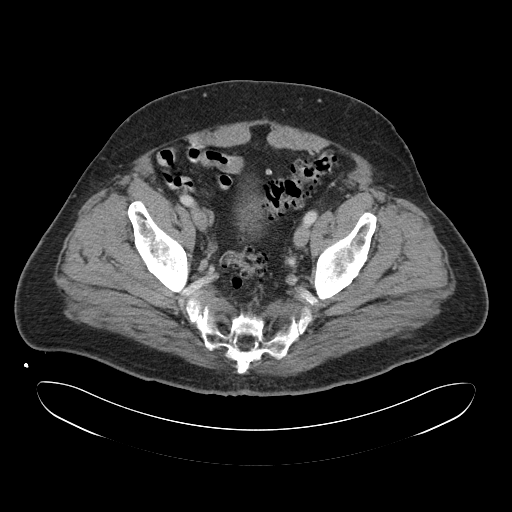
[im 33/99  soft-tissue]
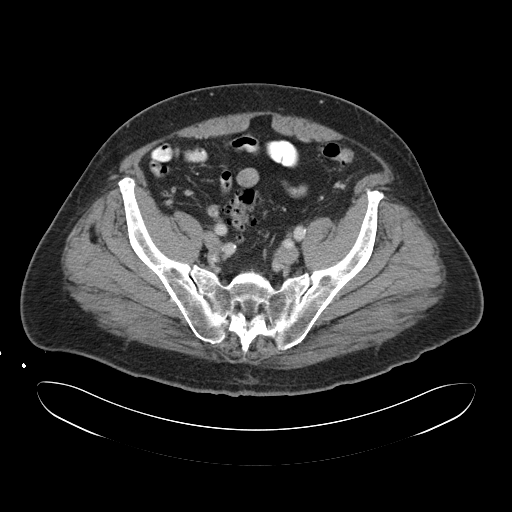
[im 44/99  soft-tissue]
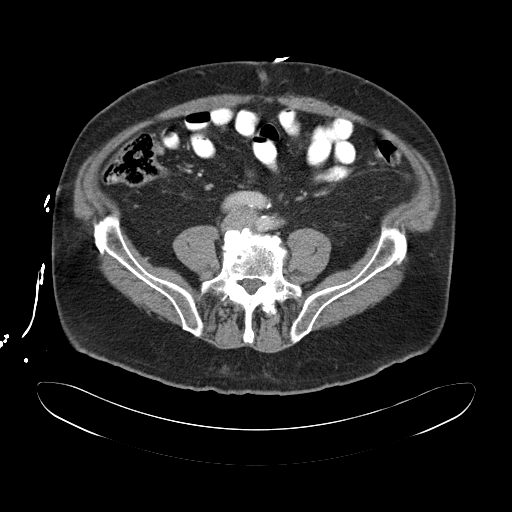
[im 50/99  soft-tissue]
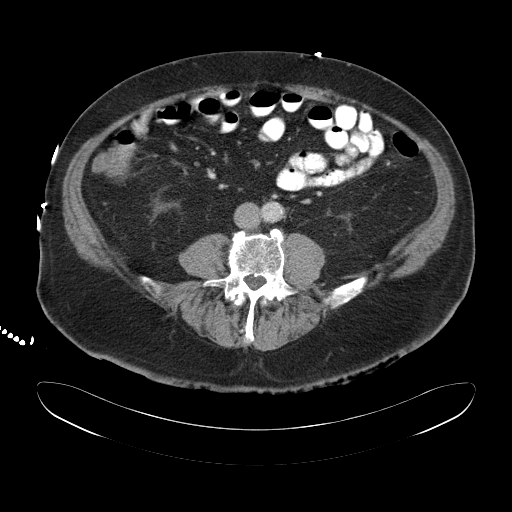
[im 55/99  soft-tissue]
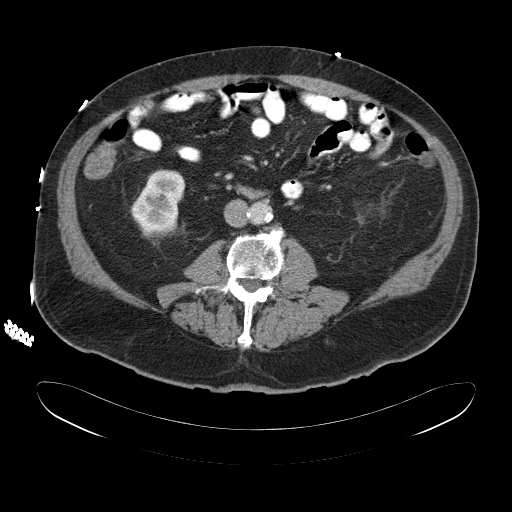
[im 66/99  soft-tissue]
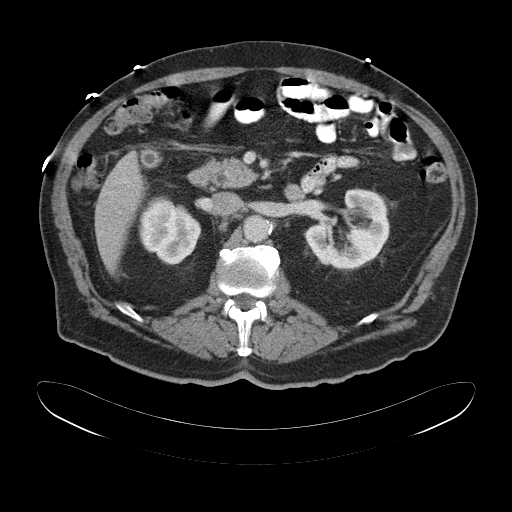
[im 66/99  bone]
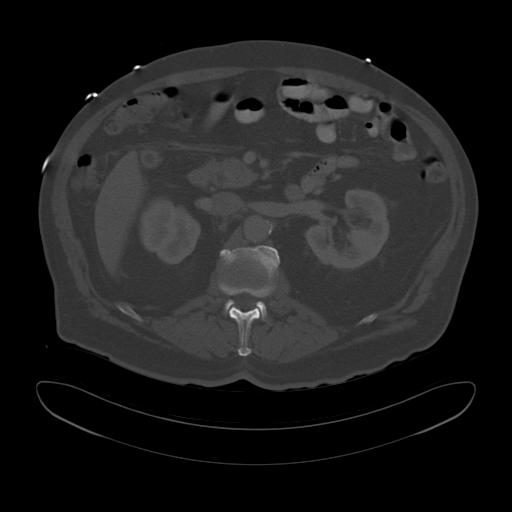
[im 71/99  soft-tissue]
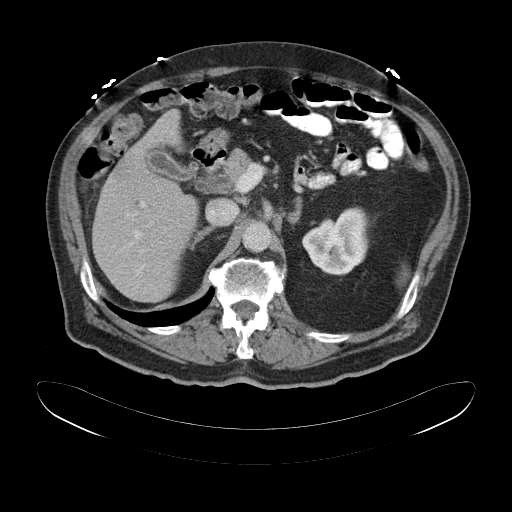
[im 77/99  soft-tissue]
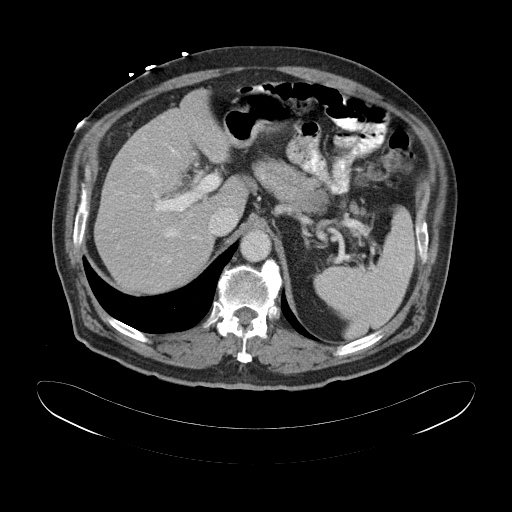
[im 88/99  soft-tissue]
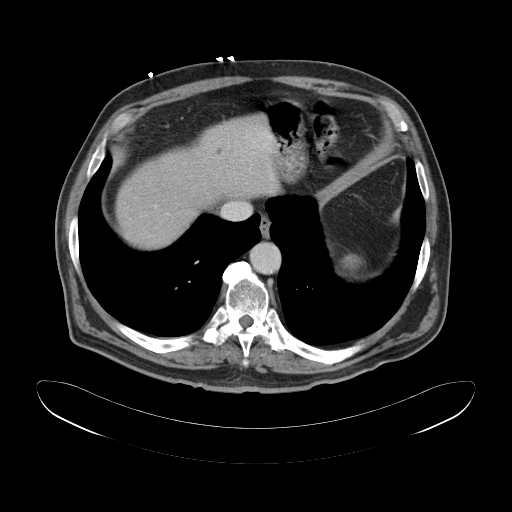
[im 93/99  soft-tissue]
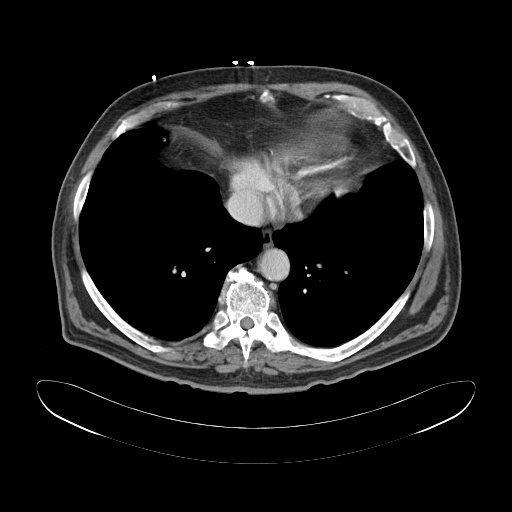

[Series 5: cor routine abd pel with · coronal · 0.85mm/px · 3 of 139 slices shown]
[im 47/139  soft-tissue]
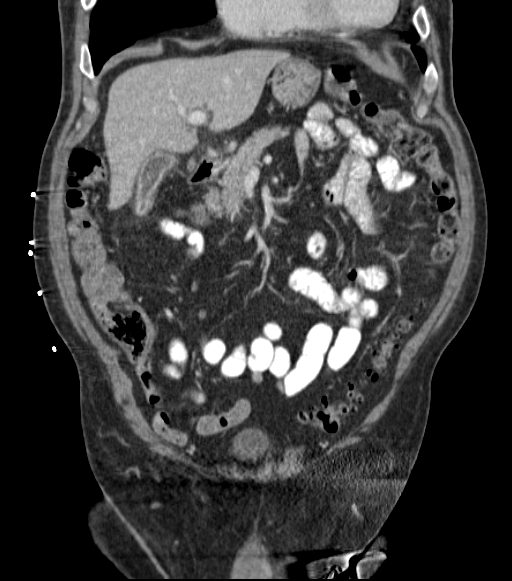
[im 62/139  soft-tissue]
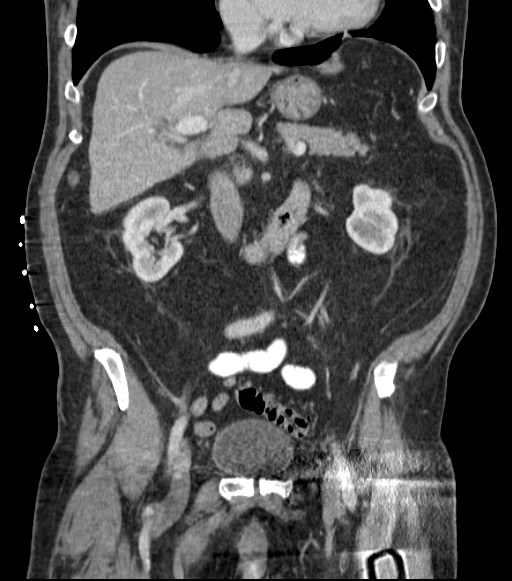
[im 77/139  soft-tissue]
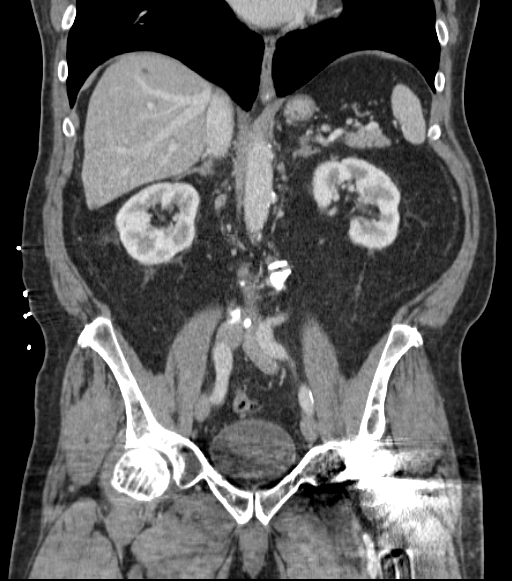

[16 of 46 positions shown; findings below may reference images not displayed]

IMPRESSION: 1. Gallstone. There may be gallbladder wall thickening as well.
Correlation with right upper quadrant sonogram is advised.
2. Mild increase caliber of the common bile duct. Cannot rule out
small stone within the ampulla. If there is a clinical concern for
choledocholithiasis or common bile duct obstruction then MRCP be
helpful for further assessment.

CONTRAST:  125 CC OF ISOVUE 370:
CONTRAST:  125 CC OF ISOVUE 370
FINDINGS: The lung bases are clear. No pleural or pericardial effusion. There
are a few low-attenuation foci within the liver parenchyma which are
too small to characterize. These appear unchanged from previous exam
and likely represent small cysts. Mild diffuse low attenuation
within the liver is noted compatible with mild hepatic steatosis.
There is a stone within the gallbladder. The gallbladder wall
appears prominent measuring. The common bile duct measures up to 9
mm. There is a tiny hyperdense structure at the expected location of
the ampulla which could represent a small stone. The pancreas
appears normal. Normal appearance of the spleen.

The adrenal glands are both normal. Normal appearance of the right
kidney. The left kidney is normal.

The urinary bladder appears normal. There is prostate gland
enlargement.

Calcified atherosclerotic disease involves the abdominal aorta.
There is no aneurysm. No upper abdominal adenopathy identified. No
pelvic or inguinal adenopathy noted.

The stomach appears normal. The small bowel loops have a normal
course and caliber without obstruction. The appendix is visualized
and appears normal. Multiple distal colonic diverticula are
identified without acute inflammation. There is no free fluid or
fluid collections within the abdomen or pelvis.

Review of the visualized bony structures is significant for L4-5 and
L5-S1 degenerative disc disease. Previous left hip arthroplasty
identified.

## 2017-12-08 DIAGNOSIS — R0602 Shortness of breath: Secondary | ICD-10-CM | POA: Diagnosis present

## 2017-12-08 DIAGNOSIS — Z87891 Personal history of nicotine dependence: Secondary | ICD-10-CM | POA: Insufficient documentation

## 2017-12-08 DIAGNOSIS — J449 Chronic obstructive pulmonary disease, unspecified: Secondary | ICD-10-CM | POA: Insufficient documentation

## 2017-12-08 DIAGNOSIS — J181 Lobar pneumonia, unspecified organism: Secondary | ICD-10-CM | POA: Diagnosis not present

## 2017-12-08 DIAGNOSIS — I10 Essential (primary) hypertension: Secondary | ICD-10-CM | POA: Diagnosis not present

## 2017-12-08 DIAGNOSIS — Z79899 Other long term (current) drug therapy: Secondary | ICD-10-CM | POA: Insufficient documentation

## 2017-12-09 ENCOUNTER — Emergency Department: Payer: Medicare HMO

## 2017-12-09 ENCOUNTER — Emergency Department
Admission: EM | Admit: 2017-12-09 | Discharge: 2017-12-09 | Disposition: A | Discharge status: Home / Self Care | Payer: Medicare HMO | Attending: Emergency Medicine | Admitting: Emergency Medicine

## 2017-12-09 ENCOUNTER — Encounter: Payer: Self-pay | Admitting: Emergency Medicine

## 2017-12-09 ENCOUNTER — Other Ambulatory Visit: Payer: Self-pay

## 2017-12-09 DIAGNOSIS — J181 Lobar pneumonia, unspecified organism: Secondary | ICD-10-CM

## 2017-12-09 DIAGNOSIS — J189 Pneumonia, unspecified organism: Secondary | ICD-10-CM

## 2017-12-09 HISTORY — DX: Chronic obstructive pulmonary disease, unspecified: J44.9

## 2017-12-09 HISTORY — DX: Essential (primary) hypertension: I10

## 2017-12-09 LAB — CBC WITH DIFFERENTIAL/PLATELET
BASOS ABS: 0.1 10*3/uL (ref 0–0.1)
Basophils Relative: 0 %
Eosinophils Absolute: 0 10*3/uL (ref 0–0.7)
Eosinophils Relative: 0 %
HEMATOCRIT: 42.4 % (ref 40.0–52.0)
Hemoglobin: 14 g/dL (ref 13.0–18.0)
LYMPHS PCT: 5 %
Lymphs Abs: 0.9 10*3/uL — ABNORMAL LOW (ref 1.0–3.6)
MCH: 30.1 pg (ref 26.0–34.0)
MCHC: 33.1 g/dL (ref 32.0–36.0)
MCV: 91 fL (ref 80.0–100.0)
MONO ABS: 1.8 10*3/uL — AB (ref 0.2–1.0)
Monocytes Relative: 10 %
NEUTROS ABS: 15.4 10*3/uL — AB (ref 1.4–6.5)
Neutrophils Relative %: 85 %
Platelets: 324 10*3/uL (ref 150–440)
RBC: 4.66 MIL/uL (ref 4.40–5.90)
RDW: 14.2 % (ref 11.5–14.5)
WBC: 18.2 10*3/uL — ABNORMAL HIGH (ref 3.8–10.6)

## 2017-12-09 LAB — BASIC METABOLIC PANEL
ANION GAP: 12 (ref 5–15)
BUN: 20 mg/dL (ref 6–20)
CO2: 23 mmol/L (ref 22–32)
Calcium: 8.7 mg/dL — ABNORMAL LOW (ref 8.9–10.3)
Chloride: 104 mmol/L (ref 101–111)
Creatinine, Ser: 0.96 mg/dL (ref 0.61–1.24)
GFR calc Af Amer: >60 mL/min (ref >60)
GLUCOSE: 150 mg/dL — AB (ref 65–99)
POTASSIUM: 3.3 mmol/L — AB (ref 3.5–5.1)
Sodium: 139 mmol/L (ref 135–145)

## 2017-12-09 LAB — TROPONIN I: Troponin I: <0.03 ng/mL (ref <0.03)

## 2017-12-09 MED ORDER — AMOXICILLIN 500 MG PO CAPS
1000.0000 mg | ORAL_CAPSULE | Freq: Once | ORAL | Status: AC
  Administered 2017-12-09: 1000 mg via ORAL
  Filled 2017-12-09: qty 2

## 2017-12-09 MED ORDER — AZITHROMYCIN 500 MG PO TABS
500.0000 mg | ORAL_TABLET | Freq: Once | ORAL | Status: AC
  Administered 2017-12-09: 500 mg via ORAL
  Filled 2017-12-09: qty 1

## 2017-12-09 MED ORDER — PREDNISONE 10 MG PO TABS: 50.0000 mg | ORAL_TABLET | Freq: Every day | ORAL | 0 refills | Status: AC

## 2017-12-09 MED ORDER — AMOXICILLIN 500 MG PO TABS: 1000.0000 mg | ORAL_TABLET | Freq: Two times a day (BID) | ORAL | 0 refills | Status: DC

## 2017-12-09 MED ORDER — ALBUTEROL SULFATE (2.5 MG/3ML) 0.083% IN NEBU
5.0000 mg | INHALATION_SOLUTION | Freq: Once | RESPIRATORY_TRACT | Status: AC
  Administered 2017-12-09: 5 mg via RESPIRATORY_TRACT
  Filled 2017-12-09: qty 6

## 2017-12-09 MED ORDER — AZITHROMYCIN 250 MG PO TABS: ORAL_TABLET | ORAL | 0 refills | Status: DC

## 2017-12-09 MED ORDER — PREDNISONE 20 MG PO TABS
60.0000 mg | ORAL_TABLET | Freq: Once | ORAL | Status: AC
  Administered 2017-12-09: 60 mg via ORAL
  Filled 2017-12-09: qty 3

## 2017-12-09 NOTE — Discharge Instructions (Signed)
Please take both of your antibiotics as well as your steroids as prescribed.  Follow-up with your primary care physician as needed and return to the emergency department for any new or worsening symptoms such as worsening shortness of breath, fevers, chills, or for any other issues whatsoever.  It was a pleasure to take care of you today, and thank you for coming to our emergency department.  If you have any questions or concerns before leaving please ask the nurse to grab me and I'm more than happy to go through your aftercare instructions again.  If you were prescribed any opioid pain medication today such as Norco, Vicodin, Percocet, morphine, hydrocodone, or oxycodone please make sure you do not drive when you are taking this medication as it can alter your ability to drive safely.  If you have any concerns once you are home that you are not improving or are in fact getting worse before you can make it to your follow-up appointment, please do not hesitate to call 911 and come back for further evaluation.  Darel Hong, MD  Results for orders placed or performed during the hospital encounter of 44/31/54  Basic metabolic panel  Result Value Ref Range   Sodium 139 135 - 145 mmol/L   Potassium 3.3 (L) 3.5 - 5.1 mmol/L   Chloride 104 101 - 111 mmol/L   CO2 23 22 - 32 mmol/L   Glucose, Bld 150 (H) 65 - 99 mg/dL   BUN 20 6 - 20 mg/dL   Creatinine, Ser 0.96 0.61 - 1.24 mg/dL   Calcium 8.7 (L) 8.9 - 10.3 mg/dL   GFR calc non Af Amer >60 >60 mL/min   GFR calc Af Amer >60 >60 mL/min   Anion gap 12 5 - 15  CBC with Differential  Result Value Ref Range   WBC 18.2 (H) 3.8 - 10.6 K/uL   RBC 4.66 4.40 - 5.90 MIL/uL   Hemoglobin 14.0 13.0 - 18.0 g/dL   HCT 42.4 40.0 - 52.0 %   MCV 91.0 80.0 - 100.0 fL   MCH 30.1 26.0 - 34.0 pg   MCHC 33.1 32.0 - 36.0 g/dL   RDW 14.2 11.5 - 14.5 %   Platelets 324 150 - 440 K/uL   Neutrophils Relative % 85 %   Neutro Abs 15.4 (H) 1.4 - 6.5 K/uL   Lymphocytes  Relative 5 %   Lymphs Abs 0.9 (L) 1.0 - 3.6 K/uL   Monocytes Relative 10 %   Monocytes Absolute 1.8 (H) 0.2 - 1.0 K/uL   Eosinophils Relative 0 %   Eosinophils Absolute 0.0 0 - 0.7 K/uL   Basophils Relative 0 %   Basophils Absolute 0.1 0 - 0.1 K/uL  Troponin I  Result Value Ref Range   Troponin I <0.03 <0.03 ng/mL   Dg Chest 2 View  Result Date: 12/09/2017 CLINICAL DATA:  83 y/o  M; shortness of breath and history of COPD. EXAM: CHEST  2 VIEW COMPARISON:  02/05/2014 chest radiograph FINDINGS: Stable moderate cardiomegaly. Status post CABG. Aortic atherosclerosis with calcification. Right anterior upper lobe consolidation. Stable 10 mm nodule projecting over the right third anterior rib is stable. No pleural effusion or pneumothorax. No acute osseous abnormality is evident. Right upper quadrant cholecystectomy clips. IMPRESSION: Right anterior upper lobe consolidation probably representing pneumonia. Stable cardiomegaly. Electronically Signed   By: Kristine Garbe M.D.   On: 12/09/2017 01:02

## 2017-12-09 NOTE — ED Triage Notes (Signed)
Pt arrives ambulatory to triage with c/o [facility] since yesterday. Pt reports hx of COPD. Pt is able to talk in complete sentences without difficulty at this time.

## 2017-12-09 NOTE — ED Provider Notes (Signed)
[facility] Emergency Department Provider Note  ____________________________________________   First MD Initiated Contact with Patient 12/09/17 0127     (approximate)  I have reviewed the triage vital signs and the nursing notes.   HISTORY  Chief Complaint Shortness of Breath    HPI David Hardin is a 83 y.o. male is self presents the emergency department with roughly 48 hours of insidious onset gradually progressive moderate severity shortness of breath and cough productive of thick yellow sputum.  He has a past medical history of COPD as well as coronary artery disease.  He did have subjective fevers and chills yesterday.  He did get a flu shot this year.  Nothing seems to make his symptoms better or worse.   Past Medical History:  Diagnosis Date  . COPD (chronic obstructive pulmonary disease) ([place])   . Hypertension     There are no active problems to display for this patient.   Past Surgical History:  Procedure Laterality Date  . BYPASS AXILLA/BRACHIAL ARTERY    . CHOLECYSTECTOMY    . HIP SURGERY Left     Prior to Admission medications   Medication Sig Start Date End Date Taking? Authorizing Provider  amLODipine (NORVASC) 5 MG tablet Take 5 mg by mouth daily.   Yes [provider]  atorvastatin (LIPITOR) 20 MG tablet Take 20 mg by mouth daily.   Yes [provider]  ELIQUIS 5 MG TABS tablet Take 5 mg by mouth 2 (two) times daily.   Yes [provider]  furosemide (LASIX) 20 MG tablet Take 20 mg by mouth daily.   Yes [provider]    Allergies Patient has no known allergies.  No family history on file.  Social History Social History   Tobacco Use  . Smoking status: Former Research scientist (life sciences)  . Smokeless tobacco: Never Used  Substance Use Topics  . Alcohol use: Yes  . Drug use: No    Review of Systems Constitutional: Positive for fevers and chills Eyes: No visual changes. ENT: No sore  throat. Cardiovascular: Positive for chest pain. Respiratory: Positive for shortness of breath. Gastrointestinal: No abdominal pain.  No nausea, no vomiting.  No diarrhea.  No constipation. Genitourinary: Negative for dysuria. Musculoskeletal: Negative for back pain. Skin: Negative for rash. Neurological: Negative for headaches, focal weakness or numbness.   ____________________________________________   PHYSICAL EXAM:  VITAL SIGNS: ED Triage Vitals  Enc Vitals Group     BP 12/09/17 0008 126/62     Pulse Rate 12/09/17 0008 96     Resp 12/09/17 0008 (!) 22     Temp 12/09/17 0008 97.8 F (36.6 C)     Temp Source 12/09/17 0008 Oral     SpO2 12/09/17 0008 96 %     Weight 12/09/17 0008 206 lb (93.4 kg)     Height 12/09/17 0008 5\' 7"  (1.702 m)     Head Circumference --      Peak Flow --      Pain Score 12/09/17 0007 4     Pain Loc --      Pain Edu? --      Excl. in [place]? --     Constitutional: Alert and oriented x4 well-appearing nontoxic no diaphoresis speaks in full clear sentences Eyes: PERRL EOMI. Head: Atraumatic. Nose: No congestion/rhinnorhea. Mouth/Throat: No trismus Neck: No stridor.   Cardiovascular: Irregularly irregular although regular rate grossly normal heart sounds.  Good peripheral circulation. Respiratory: Faint wheezes in all fields no respiratory  distress slight rhonchi in right upper Gastrointestinal: Soft nontender Musculoskeletal: No lower extremity edema   Neurologic:  Normal speech and language. No gross focal neurologic deficits are appreciated. Skin:  Skin is warm, dry and intact. No rash noted. Psychiatric: Mood and affect are normal. Speech and behavior are normal.    ____________________________________________   DIFFERENTIAL includes but not limited to  COPD exacerbation, pneumonia, pneumothorax, pulmonary embolism, acute coronary syndrome ____________________________________________   LABS (all labs ordered are listed, but only  abnormal results are displayed)  Labs Reviewed  BASIC METABOLIC PANEL - Abnormal; Notable for the following components:      Result Value   Potassium 3.3 (*)    Glucose, Bld 150 (*)    Calcium 8.7 (*)    All other components within normal limits  CBC WITH DIFFERENTIAL/PLATELET - Abnormal; Notable for the following components:   WBC 18.2 (*)    Neutro Abs 15.4 (*)    Lymphs Abs 0.9 (*)    Monocytes Absolute 1.8 (*)    All other components within normal limits  TROPONIN I    Blood work reviewed by me shows elevated white count which is consistent with acute infection __________________________________________  EKG  ED ECG REPORT I, Darel Hong, the attending physician, personally viewed and interpreted this ECG.  Date: 12/09/2017 EKG Time:  Rate: 82 Rhythm: Atrial fibrillation QRS Axis: Rightward axis Intervals: normal ST/T Wave abnormalities: normal Narrative Interpretation: no evidence of acute ischemia  ____________________________________________  RADIOLOGY  Chest x-ray reviewed by me concerning for right upper lobe pneumonia ____________________________________________   PROCEDURES  Procedure(s) performed: no  Procedures  Critical Care performed: no  Observation: no ____________________________________________   INITIAL IMPRESSION / ASSESSMENT AND PLAN / ED COURSE  Pertinent labs & imaging results that were available during my care of the patient were reviewed by me and considered in my medical decision making (see chart for details).  The patient arrives very well-appearing hemodynamically stable with no respiratory distress.  By the time I saw him he had already had a single breathing significantly improved his symptoms.  He has a productive cough while I am in the room with him and slightly decreased breath sounds on the right upper.  Chest x-ray confirms what appears to be a right upper lobe pneumonia.  He remains slightly wheezy so in addition to  azithromycin and amoxicillin I will give him a short course of prednisone.  Strict return precautions have been given and the patient verbalizes understanding and agreement the plan.      ____________________________________________   FINAL CLINICAL IMPRESSION(S) / ED DIAGNOSES  Final diagnoses:  None      NEW MEDICATIONS STARTED DURING THIS VISIT:  This SmartLink is deprecated. Use AVSMEDLIST instead to display the medication list for a patient.   Note:  This document was prepared using Dragon voice recognition software and may include unintentional dictation errors.     Darel Hong, MD 12/09/17 [id number]

## 2017-12-14 ENCOUNTER — Inpatient Hospital Stay
Admission: EM | Admit: 2017-12-14 | Discharge: 2017-12-16 | DRG: 190 | Disposition: A | Discharge status: Home / Self Care | Payer: Medicare HMO | Attending: Internal Medicine | Admitting: Internal Medicine

## 2017-12-14 ENCOUNTER — Emergency Department: Payer: Medicare HMO

## 2017-12-14 DIAGNOSIS — J181 Lobar pneumonia, unspecified organism: Secondary | ICD-10-CM | POA: Diagnosis present

## 2017-12-14 DIAGNOSIS — T380X5A Adverse effect of glucocorticoids and synthetic analogues, initial encounter: Secondary | ICD-10-CM | POA: Diagnosis present

## 2017-12-14 DIAGNOSIS — J9601 Acute respiratory failure with hypoxia: Secondary | ICD-10-CM | POA: Diagnosis present

## 2017-12-14 DIAGNOSIS — J189 Pneumonia, unspecified organism: Secondary | ICD-10-CM

## 2017-12-14 DIAGNOSIS — J441 Chronic obstructive pulmonary disease with (acute) exacerbation: Principal | ICD-10-CM

## 2017-12-14 DIAGNOSIS — Z79899 Other long term (current) drug therapy: Secondary | ICD-10-CM

## 2017-12-14 DIAGNOSIS — J440 Chronic obstructive pulmonary disease with acute lower respiratory infection: Secondary | ICD-10-CM | POA: Diagnosis present

## 2017-12-14 DIAGNOSIS — Z87891 Personal history of nicotine dependence: Secondary | ICD-10-CM

## 2017-12-14 DIAGNOSIS — A419 Sepsis, unspecified organism: Secondary | ICD-10-CM

## 2017-12-14 DIAGNOSIS — I480 Paroxysmal atrial fibrillation: Secondary | ICD-10-CM | POA: Diagnosis present

## 2017-12-14 DIAGNOSIS — I10 Essential (primary) hypertension: Secondary | ICD-10-CM | POA: Diagnosis present

## 2017-12-14 DIAGNOSIS — Z7901 Long term (current) use of anticoagulants: Secondary | ICD-10-CM

## 2017-12-14 DIAGNOSIS — E785 Hyperlipidemia, unspecified: Secondary | ICD-10-CM | POA: Diagnosis present

## 2017-12-14 DIAGNOSIS — D72829 Elevated white blood cell count, unspecified: Secondary | ICD-10-CM | POA: Diagnosis present

## 2017-12-14 DIAGNOSIS — R0902 Hypoxemia: Secondary | ICD-10-CM

## 2017-12-14 DIAGNOSIS — I2510 Atherosclerotic heart disease of native coronary artery without angina pectoris: Secondary | ICD-10-CM | POA: Diagnosis present

## 2017-12-14 HISTORY — DX: Atherosclerotic heart disease of native coronary artery without angina pectoris: I25.10

## 2017-12-14 HISTORY — DX: Unspecified atrial fibrillation: I48.91

## 2017-12-14 HISTORY — DX: Hyperlipidemia, unspecified: E78.5

## 2017-12-14 LAB — CBC WITH DIFFERENTIAL/PLATELET
BASOS ABS: 0 10*3/uL (ref 0–0.1)
BLASTS: 0 %
Band Neutrophils: 0 %
Basophils Relative: 0 %
Eosinophils Absolute: 0 10*3/uL (ref 0–0.7)
Eosinophils Relative: 0 %
HEMATOCRIT: 43.6 % (ref 40.0–52.0)
Hemoglobin: 14.7 g/dL (ref 13.0–18.0)
Lymphocytes Relative: 2 %
Lymphs Abs: 0.4 10*3/uL — ABNORMAL LOW (ref 1.0–3.6)
MCH: 30.4 pg (ref 26.0–34.0)
MCHC: 33.7 g/dL (ref 32.0–36.0)
MCV: 90.2 fL (ref 80.0–100.0)
METAMYELOCYTES PCT: 0 %
MONOS PCT: 17 %
MYELOCYTES: 0 %
Monocytes Absolute: 3.6 10*3/uL — ABNORMAL HIGH (ref 0.2–1.0)
Neutro Abs: 17.2 10*3/uL — ABNORMAL HIGH (ref 1.4–6.5)
Neutrophils Relative %: 81 %
Other: 0 %
PROMYELOCYTES ABS: 0 %
Platelets: 431 10*3/uL (ref 150–440)
RBC: 4.83 MIL/uL (ref 4.40–5.90)
RDW: 14.3 % (ref 11.5–14.5)
WBC: 21.2 10*3/uL — AB (ref 3.8–10.6)
nRBC: 0 /100 WBC

## 2017-12-14 LAB — LACTIC ACID, PLASMA: LACTIC ACID, VENOUS: 1.3 mmol/L (ref 0.5–1.9)

## 2017-12-14 LAB — COMPREHENSIVE METABOLIC PANEL
ALT: 77 U/L — AB (ref 17–63)
AST: 41 U/L (ref 15–41)
Albumin: 3.9 g/dL (ref 3.5–5.0)
Alkaline Phosphatase: 100 U/L (ref 38–126)
Anion gap: 12 (ref 5–15)
BILIRUBIN TOTAL: 1.9 mg/dL — AB (ref 0.3–1.2)
BUN: 20 mg/dL (ref 6–20)
CO2: 27 mmol/L (ref 22–32)
CREATININE: 1 mg/dL (ref 0.61–1.24)
Calcium: 8.6 mg/dL — ABNORMAL LOW (ref 8.9–10.3)
Chloride: 97 mmol/L — ABNORMAL LOW (ref 101–111)
GFR calc Af Amer: >60 mL/min (ref >60)
GLUCOSE: 151 mg/dL — AB (ref 65–99)
Potassium: 3.8 mmol/L (ref 3.5–5.1)
Sodium: 136 mmol/L (ref 135–145)
TOTAL PROTEIN: 6.4 g/dL — AB (ref 6.5–8.1)

## 2017-12-14 LAB — TROPONIN I: TROPONIN I: 0.04 ng/mL — AB (ref <0.03)

## 2017-12-14 MED ORDER — METHYLPREDNISOLONE SODIUM SUCC 125 MG IJ SOLR
125.0000 mg | Freq: Once | INTRAMUSCULAR | Status: AC
  Administered 2017-12-15: 125 mg via INTRAVENOUS
  Filled 2017-12-14: qty 2

## 2017-12-14 MED ORDER — IPRATROPIUM-ALBUTEROL 0.5-2.5 (3) MG/3ML IN SOLN
3.0000 mL | Freq: Once | RESPIRATORY_TRACT | Status: AC
  Administered 2017-12-14: 3 mL via RESPIRATORY_TRACT

## 2017-12-14 MED ORDER — VANCOMYCIN HCL IN DEXTROSE 1-5 GM/200ML-% IV SOLN
1000.0000 mg | Freq: Once | INTRAVENOUS | Status: AC
  Administered 2017-12-15: 1000 mg via INTRAVENOUS
  Filled 2017-12-14: qty 200

## 2017-12-14 MED ORDER — SODIUM CHLORIDE 0.9 % IV BOLUS (SEPSIS)
1000.0000 mL | Freq: Once | INTRAVENOUS | Status: AC
  Administered 2017-12-15: 1000 mL via INTRAVENOUS

## 2017-12-14 MED ORDER — ACETAMINOPHEN 500 MG PO TABS
1000.0000 mg | ORAL_TABLET | Freq: Once | ORAL | Status: AC
  Administered 2017-12-15: 1000 mg via ORAL
  Filled 2017-12-14: qty 2

## 2017-12-14 MED ORDER — SODIUM CHLORIDE 0.9 % IV BOLUS (SEPSIS)
1000.0000 mL | Freq: Once | INTRAVENOUS | Status: AC
  Administered 2017-12-14: 1000 mL via INTRAVENOUS

## 2017-12-14 MED ORDER — DEXTROSE 5 % IV SOLN
2.0000 g | Freq: Once | INTRAVENOUS | Status: AC
  Administered 2017-12-15: 2 g via INTRAVENOUS
  Filled 2017-12-14: qty 2

## 2017-12-14 MED ORDER — IPRATROPIUM-ALBUTEROL 0.5-2.5 (3) MG/3ML IN SOLN
RESPIRATORY_TRACT | Status: AC
  Filled 2017-12-14: qty 3

## 2017-12-14 NOTE — ED Notes (Signed)
Patient started on amoxicillin, Zpack, and prednisone this past Saturday by [facility] ED physician for pneumonia per family.

## 2017-12-14 NOTE — ED Notes (Signed)
Patient's oxygen saturation 94% on 2L [place]. RN will continue to monitor

## 2017-12-14 NOTE — ED Triage Notes (Signed)
Patient tested at PCP for influenza yesterday - negative per patient.

## 2017-12-14 NOTE — ED Provider Notes (Signed)
[facility] Emergency Department Provider Note   ____________________________________________   First MD Initiated Contact with Patient 12/14/17 2310     (approximate)  I have reviewed the triage vital signs and the nursing notes.   HISTORY  Chief Complaint Shortness of Breath    HPI David Hardin is a 84 y.o. male who presents to the ED from home with a chief complaint of shortness of breath.  Patient has a history of COPD, not on continuous oxygen, who was recently diagnosed with pneumonia on 12/09/2017.  He was placed on amoxicillin, Z-Pak and prednisone burst. States he felt better until yesterday when he was seen by his PCP with negative influenza swab.  Felt worse tonight and his daughter, who is a Marine scientist, checked his room air saturations which were 84%.  Complains of cough productive of yellow sputum, shortness of breath, chills, decreased appetite.  Denies chest pain, abdominal pain, nausea, vomiting, dysuria, diarrhea.  Denies recent travel or trauma.  Nothing makes his symptoms better or worse.   Past Medical History:  Diagnosis Date  . COPD (chronic obstructive pulmonary disease) ([place])   . 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 BYPASS AXILLA/BRACHIAL ARTERY    . CHOLECYSTECTOMY    . HIP SURGERY Left     Prior to Admission medications   Medication Sig Start Date End Date Taking? Authorizing Provider  amLODipine (NORVASC) 5 MG tablet Take 5 mg by mouth daily.    [provider]    amoxicillin (AMOXIL) 500 MG tablet Take 2 tablets (1,000 mg total) by mouth 2 (two) times daily for 7 days. 12/09/17 12/16/17  Darel Hong, MD  atorvastatin (LIPITOR) 20 MG tablet Take 20 mg by mouth daily.    [provider]  azithromycin (ZITHROMAX Z-PAK) 250 MG tablet Take 2 tablets (500 mg) on  Day 1,  followed by 1 tablet (250 mg) once daily on Days 2 through 5. 12/09/17   Darel Hong, MD  ELIQUIS 5 MG TABS tablet Take 5 mg by mouth 2 (two) times daily.    [provider]  furosemide (LASIX) 20 MG tablet Take 20 mg by mouth daily.    [provider]    Allergies Patient has no known allergies.  No family history on file.  Social History Social History   Tobacco Use  . Smoking status: Former Research scientist (life sciences)  . Smokeless tobacco: Never Used  Substance Use Topics  . Alcohol use: Yes  . Drug use: No    Review of Systems  Constitutional: Positive for fever/chills. Eyes: No visual changes. ENT: No sore throat. Cardiovascular: Denies chest pain. Respiratory: Positive for productive cough and shortness of breath. Gastrointestinal: No abdominal pain.  No nausea, no vomiting.  No diarrhea.  No constipation. Genitourinary: Negative for dysuria. Musculoskeletal: Negative for back pain. Skin: Negative for rash. Neurological: Negative for headaches, focal weakness or numbness.   ____________________________________________   PHYSICAL EXAM:  VITAL SIGNS: ED Triage Vitals  Enc Vitals Group     BP 12/14/17 2244 (!) 153/116  Pulse Rate 12/14/17 2244 67     Resp 12/14/17 2244 (!) 23     Temp 12/14/17 2244 99.4 F (37.4 C)     Temp Source 12/14/17 2244 Oral     SpO2 12/14/17 2244 (!) 89 %     Weight --      Height --      Head Circumference --      Peak Flow --      Pain Score 12/14/17 2241 0     Pain Loc --      Pain Edu? --      Excl. in [place]? --     Constitutional: Alert and oriented. Well appearing and in mild acute distress. Eyes:  Conjunctivae are normal. PERRL. EOMI. Head: Atraumatic. Nose: No congestion/rhinnorhea. Mouth/Throat: Mucous membranes are moist.  Oropharynx non-erythematous. Neck: No stridor.  Supple neck without meningismus. Cardiovascular: Normal rate, irregular rhythm. Grossly normal heart sounds.  Good peripheral circulation. Respiratory: Increased respiratory effort.  No retractions. Lungs with scattered rhonchi. Gastrointestinal: Soft and nontender. No distention. No abdominal bruits. No CVA tenderness. Musculoskeletal: No lower extremity tenderness nor edema.  No joint effusions. Neurologic:  Normal speech and language. No gross focal neurologic deficits are appreciated. No gait instability. Skin:  Skin is warm, dry and intact. No rash noted.  No petechiae. Psychiatric: Mood and affect are normal. Speech and behavior are normal.  ____________________________________________   LABS (all labs ordered are listed, but only abnormal results are displayed)  Labs Reviewed  CBC WITH DIFFERENTIAL/PLATELET - Abnormal; Notable for the following components:      Result Value   WBC 21.2 (*)    All other components within normal limits  CULTURE, BLOOD (ROUTINE X 2)  CULTURE, BLOOD (ROUTINE X 2)  URINE CULTURE  LACTIC ACID, PLASMA  TROPONIN I  COMPREHENSIVE METABOLIC PANEL  LACTIC ACID, PLASMA  URINALYSIS, ROUTINE W REFLEX MICROSCOPIC   ____________________________________________  EKG  ED ECG REPORT I, Meredyth Hornung J, the attending physician, personally viewed and interpreted this ECG.   Date: 12/14/2017  EKG Time: 2244  Rate: 89  Rhythm: atrial fibrillation, rate 89  Axis: RAD  Intervals:none  ST&T Change: Nonspecific  ____________________________________________  RADIOLOGY  Dg Chest Portable 1 View  Result Date: 12/14/2017 CLINICAL DATA:  Shortness of breath. EXAM: PORTABLE CHEST 1 VIEW COMPARISON:  Radiograph 4 days prior 12/09/2017 FINDINGS: Previous right upper lobe consolidation has  near completely resolved with minimal residual patchy opacity. No new airspace disease. Cardiomegaly is unchanged. No pulmonary edema. No pleural effusion or pneumothorax. The previous nodule projecting over the right upper chest is tentatively identified, however obscured by overlying oxygen tubing. IMPRESSION: 1. Near complete resolution of right upper lobe consolidation since prior exam with minimal residual opacity consistent with resolving pneumonia. 2. Stable cardiomegaly.  No new abnormality. Electronically Signed   By: Jeb Levering M.D.   On: 12/14/2017 23:05    ____________________________________________   PROCEDURES  Procedure(s) performed: None  Procedures  Critical Care performed: No  ____________________________________________   INITIAL IMPRESSION / ASSESSMENT AND PLAN / ED COURSE  As part of my medical decision making, I reviewed the following data within the [place] History obtained from family, Nursing notes reviewed and incorporated, Labs reviewed, EKG interpreted, Old EKG reviewed, Old chart reviewed, Radiograph reviewed, Discussed with admitting physician and Notes from prior ED visits.   84 year old male with COPD, not on continuous oxygen, who presents with productive cough and shortness of breath.  Recently finished Z-Pak, amoxicillin  and prednisone burst for right upper lobe pneumonia diagnosed 12/09/2017. Differential includes, but is not limited to, viral syndrome, bronchitis including COPD exacerbation, pneumonia, reactive airway disease including asthma, CHF including exacerbation with or without pulmonary/interstitial edema, pneumothorax, ACS, thoracic trauma, and pulmonary embolism.  ED code sepsis initiated.  Tylenol given for fever.  Noted leukocytosis which is higher than 5 days ago; however, may be partially elevated secondary to recent prednisone use.  Patient finished DuoNeb prior to my examination feels better.  Will add IV  Solu-Medrol, IV antibiotics and discuss with hospitalist to evaluate patient in the emergency department for admission for hypoxia.      ____________________________________________   FINAL CLINICAL IMPRESSION(S) / ED DIAGNOSES  Final diagnoses:  COPD exacerbation ([place])  Hypoxia  Community acquired pneumonia of right upper lobe of lung ([place])  Sepsis, due to unspecified organism [facility])     ED Discharge Orders    None       Note:  This document was prepared using Dragon voice recognition software and may include unintentional dictation errors.    Paulette Blanch, MD 12/15/17 [phone]

## 2017-12-14 NOTE — ED Notes (Signed)
ED Provider at bedside. 

## 2017-12-14 NOTE — ED Triage Notes (Signed)
Patient reports he's been sick since Saturday. Patient c/o SOB. Patient's respirations labored on arrival. Patient reports oxygen saturation in the 80s at home.

## 2017-12-14 NOTE — ED Notes (Signed)
Patient placed on 2L [place].

## 2017-12-15 ENCOUNTER — Other Ambulatory Visit: Payer: Self-pay

## 2017-12-15 ENCOUNTER — Encounter: Payer: Self-pay | Admitting: Internal Medicine

## 2017-12-15 DIAGNOSIS — I480 Paroxysmal atrial fibrillation: Secondary | ICD-10-CM | POA: Diagnosis present

## 2017-12-15 DIAGNOSIS — J441 Chronic obstructive pulmonary disease with (acute) exacerbation: Secondary | ICD-10-CM | POA: Diagnosis present

## 2017-12-15 DIAGNOSIS — Z87891 Personal history of nicotine dependence: Secondary | ICD-10-CM | POA: Diagnosis not present

## 2017-12-15 DIAGNOSIS — D72829 Elevated white blood cell count, unspecified: Secondary | ICD-10-CM | POA: Diagnosis present

## 2017-12-15 DIAGNOSIS — J9601 Acute respiratory failure with hypoxia: Secondary | ICD-10-CM | POA: Diagnosis present

## 2017-12-15 DIAGNOSIS — I10 Essential (primary) hypertension: Secondary | ICD-10-CM | POA: Diagnosis present

## 2017-12-15 DIAGNOSIS — Z79899 Other long term (current) drug therapy: Secondary | ICD-10-CM | POA: Diagnosis not present

## 2017-12-15 DIAGNOSIS — I2510 Atherosclerotic heart disease of native coronary artery without angina pectoris: Secondary | ICD-10-CM | POA: Diagnosis present

## 2017-12-15 DIAGNOSIS — E785 Hyperlipidemia, unspecified: Secondary | ICD-10-CM | POA: Diagnosis present

## 2017-12-15 DIAGNOSIS — T380X5A Adverse effect of glucocorticoids and synthetic analogues, initial encounter: Secondary | ICD-10-CM | POA: Diagnosis present

## 2017-12-15 DIAGNOSIS — Z7901 Long term (current) use of anticoagulants: Secondary | ICD-10-CM | POA: Diagnosis not present

## 2017-12-15 DIAGNOSIS — J440 Chronic obstructive pulmonary disease with acute lower respiratory infection: Secondary | ICD-10-CM | POA: Diagnosis present

## 2017-12-15 DIAGNOSIS — A419 Sepsis, unspecified organism: Secondary | ICD-10-CM | POA: Diagnosis present

## 2017-12-15 DIAGNOSIS — J181 Lobar pneumonia, unspecified organism: Secondary | ICD-10-CM | POA: Diagnosis present

## 2017-12-15 LAB — GLUCOSE, CAPILLARY: GLUCOSE-CAPILLARY: 149 mg/dL — AB (ref 65–99)

## 2017-12-15 LAB — URINALYSIS, ROUTINE W REFLEX MICROSCOPIC
BACTERIA UA: NONE SEEN
BILIRUBIN URINE: NEGATIVE
Glucose, UA: >=500 mg/dL — AB
Hgb urine dipstick: NEGATIVE
Ketones, ur: NEGATIVE mg/dL
Leukocytes, UA: NEGATIVE
Nitrite: NEGATIVE
Protein, ur: 30 mg/dL — AB
SPECIFIC GRAVITY, URINE: 1.026 (ref 1.005–1.030)
pH: 5 (ref 5.0–8.0)

## 2017-12-15 LAB — CBC
HCT: 41.6 % (ref 40.0–52.0)
Hemoglobin: 13.8 g/dL (ref 13.0–18.0)
MCH: 30.3 pg (ref 26.0–34.0)
MCHC: 33.2 g/dL (ref 32.0–36.0)
MCV: 91.5 fL (ref 80.0–100.0)
PLATELETS: 391 10*3/uL (ref 150–440)
RBC: 4.54 MIL/uL (ref 4.40–5.90)
RDW: 14.2 % (ref 11.5–14.5)
WBC: 19.4 10*3/uL — AB (ref 3.8–10.6)

## 2017-12-15 LAB — BASIC METABOLIC PANEL
Anion gap: 7 (ref 5–15)
BUN: 19 mg/dL (ref 6–20)
CALCIUM: 8.4 mg/dL — AB (ref 8.9–10.3)
CHLORIDE: 104 mmol/L (ref 101–111)
CO2: 27 mmol/L (ref 22–32)
CREATININE: 0.87 mg/dL (ref 0.61–1.24)
Glucose, Bld: 186 mg/dL — ABNORMAL HIGH (ref 65–99)
Potassium: 4.4 mmol/L (ref 3.5–5.1)
SODIUM: 138 mmol/L (ref 135–145)

## 2017-12-15 MED ORDER — BUDESONIDE 0.25 MG/2ML IN SUSP: 0.2500 mg | Freq: Two times a day (BID) | RESPIRATORY_TRACT | Status: DC

## 2017-12-15 MED ORDER — VANCOMYCIN HCL IN DEXTROSE 1-5 GM/200ML-% IV SOLN: 1000.0000 mg | Freq: Two times a day (BID) | INTRAVENOUS | Status: DC

## 2017-12-15 MED ORDER — METHYLPREDNISOLONE SODIUM SUCC 40 MG IJ SOLR
40.0000 mg | Freq: Two times a day (BID) | INTRAMUSCULAR | Status: DC
  Administered 2017-12-15 – 2017-12-16 (×2): 40 mg via INTRAVENOUS
  Filled 2017-12-15 (×2): qty 1

## 2017-12-15 MED ORDER — AMLODIPINE BESYLATE 5 MG PO TABS
5.0000 mg | ORAL_TABLET | Freq: Every day | ORAL | Status: DC
  Administered 2017-12-15 – 2017-12-16 (×2): 5 mg via ORAL
  Filled 2017-12-15 (×2): qty 1

## 2017-12-15 MED ORDER — ATORVASTATIN CALCIUM 20 MG PO TABS
20.0000 mg | ORAL_TABLET | Freq: Every day | ORAL | Status: DC
  Administered 2017-12-15: 20 mg via ORAL
  Filled 2017-12-15: qty 1

## 2017-12-15 MED ORDER — TRAZODONE HCL 50 MG PO TABS: 25.0000 mg | ORAL_TABLET | Freq: Every evening | ORAL | Status: DC | PRN

## 2017-12-15 MED ORDER — IPRATROPIUM BROMIDE HFA 17 MCG/ACT IN AERS: 2.0000 | INHALATION_SPRAY | Freq: Four times a day (QID) | RESPIRATORY_TRACT | Status: DC

## 2017-12-15 MED ORDER — HYDROCODONE-ACETAMINOPHEN 5-325 MG PO TABS: 1.0000 | ORAL_TABLET | ORAL | Status: DC | PRN

## 2017-12-15 MED ORDER — ONDANSETRON HCL 4 MG/2ML IJ SOLN: 4.0000 mg | Freq: Four times a day (QID) | INTRAMUSCULAR | Status: DC | PRN

## 2017-12-15 MED ORDER — FUROSEMIDE 20 MG PO TABS
20.0000 mg | ORAL_TABLET | Freq: Every day | ORAL | Status: DC
  Administered 2017-12-15 – 2017-12-16 (×2): 20 mg via ORAL
  Filled 2017-12-15 (×2): qty 1

## 2017-12-15 MED ORDER — DEXTROSE 5 % IV SOLN
2.0000 g | INTRAVENOUS | Status: DC
  Administered 2017-12-15 – 2017-12-16 (×2): 2 g via INTRAVENOUS
  Filled 2017-12-15 (×2): qty 2

## 2017-12-15 MED ORDER — ACETAMINOPHEN 650 MG RE SUPP: 650.0000 mg | Freq: Four times a day (QID) | RECTAL | Status: DC | PRN

## 2017-12-15 MED ORDER — APIXABAN 5 MG PO TABS
5.0000 mg | ORAL_TABLET | Freq: Two times a day (BID) | ORAL | Status: DC
  Administered 2017-12-15 – 2017-12-16 (×3): 5 mg via ORAL
  Filled 2017-12-15 (×3): qty 1

## 2017-12-15 MED ORDER — ACETAMINOPHEN 325 MG PO TABS: 650.0000 mg | ORAL_TABLET | Freq: Four times a day (QID) | ORAL | Status: DC | PRN

## 2017-12-15 MED ORDER — DEXTROSE 5 % IV SOLN
500.0000 mg | INTRAVENOUS | Status: DC
  Administered 2017-12-15 – 2017-12-16 (×2): 500 mg via INTRAVENOUS
  Filled 2017-12-15 (×2): qty 500

## 2017-12-15 MED ORDER — BISACODYL 5 MG PO TBEC: 5.0000 mg | DELAYED_RELEASE_TABLET | Freq: Every day | ORAL | Status: DC | PRN

## 2017-12-15 MED ORDER — ONDANSETRON HCL 4 MG PO TABS: 4.0000 mg | ORAL_TABLET | Freq: Four times a day (QID) | ORAL | Status: DC | PRN

## 2017-12-15 MED ORDER — DOCUSATE SODIUM 100 MG PO CAPS
100.0000 mg | ORAL_CAPSULE | Freq: Two times a day (BID) | ORAL | Status: DC
  Administered 2017-12-15 – 2017-12-16 (×3): 100 mg via ORAL
  Filled 2017-12-15 (×3): qty 1

## 2017-12-15 MED ORDER — METHYLPREDNISOLONE SODIUM SUCC 125 MG IJ SOLR
125.0000 mg | Freq: Four times a day (QID) | INTRAMUSCULAR | Status: DC
  Administered 2017-12-15 (×2): 125 mg via INTRAVENOUS
  Filled 2017-12-15 (×2): qty 2

## 2017-12-15 MED ORDER — SODIUM CHLORIDE 0.9 % IV SOLN: INTRAVENOUS | Status: DC

## 2017-12-15 MED ORDER — DEXTROSE 5 % IV SOLN: 1.0000 g | Freq: Two times a day (BID) | INTRAVENOUS | Status: DC

## 2017-12-15 MED ORDER — BENZONATATE 100 MG PO CAPS
200.0000 mg | ORAL_CAPSULE | Freq: Once | ORAL | Status: AC
  Administered 2017-12-15: 200 mg via ORAL

## 2017-12-15 MED ORDER — HYDROCODONE-HOMATROPINE 5-1.5 MG/5ML PO SYRP
5.0000 mL | ORAL_SOLUTION | Freq: Four times a day (QID) | ORAL | Status: DC | PRN
  Administered 2017-12-15 – 2017-12-16 (×4): 5 mL via ORAL
  Filled 2017-12-15 (×8): qty 5

## 2017-12-15 MED ORDER — DEXTROSE 5 % IV SOLN: 2.0000 g | Freq: Two times a day (BID) | INTRAVENOUS | Status: DC

## 2017-12-15 MED ORDER — BENZONATATE 100 MG PO CAPS
ORAL_CAPSULE | ORAL | Status: AC
  Filled 2017-12-15: qty 2

## 2017-12-15 MED ORDER — IPRATROPIUM BROMIDE 0.02 % IN SOLN: 0.5000 mg | Freq: Four times a day (QID) | RESPIRATORY_TRACT | Status: DC

## 2017-12-15 MED ORDER — IPRATROPIUM-ALBUTEROL 0.5-2.5 (3) MG/3ML IN SOLN
3.0000 mL | Freq: Four times a day (QID) | RESPIRATORY_TRACT | Status: DC
  Administered 2017-12-15 – 2017-12-16 (×5): 3 mL via RESPIRATORY_TRACT
  Filled 2017-12-15 (×5): qty 3

## 2017-12-15 NOTE — Progress Notes (Signed)
[place] [facility] [place] NAME: David Hardin    MR#:  [id number]  DATE OF BIRTH:  1927/09/25  SUBJECTIVE:  CHIEF COMPLAINT:   Chief Complaint  Patient presents with  . Shortness of Breath    Had pneumonia and sent home from ER last week with prescription of antibiotic and oral prednisone. He finished that over last 5 days still continued to feel worse and so came back to emergency room with complaint of hypoxia, chest tightness. Still requiring supplemental oxygen and has some chest tightness with wheezing.  REVIEW OF SYSTEMS:  CONSTITUTIONAL: No fever, fatigue or weakness.  EYES: No blurred or double vision.  EARS, NOSE, AND THROAT: No tinnitus or ear pain.  RESPIRATORY: Have cough, shortness of breath, wheezing, no hemoptysis.  CARDIOVASCULAR: No chest pain, orthopnea, edema.  GASTROINTESTINAL: No nausea, vomiting, diarrhea or abdominal pain.  GENITOURINARY: No dysuria, hematuria.  ENDOCRINE: No polyuria, nocturia,  HEMATOLOGY: No anemia, easy bruising or bleeding SKIN: No rash or lesion. MUSCULOSKELETAL: No joint pain or arthritis.   NEUROLOGIC: No tingling, numbness, weakness.  PSYCHIATRY: No anxiety or depression.   ROS  DRUG ALLERGIES:  No Known Allergies  VITALS:  Blood pressure (!) 134/91, pulse (!) 57, temperature 97.8 F (36.6 C), temperature source Oral, resp. rate (!) 22, height 5\' 7"  (1.702 m), weight 97.8 kg (215 lb 11.2 oz), SpO2 98 %.  PHYSICAL EXAMINATION:  GENERAL:  84 y.o.-year-old patient lying in the bed with no acute distress.  EYES: Pupils equal, round, reactive to light and accommodation. No scleral icterus. Extraocular muscles intact.  HEENT: Head atraumatic, normocephalic. Oropharynx and nasopharynx clear.  NECK:  Supple, no jugular venous distention. No thyroid enlargement, no tenderness.  LUNGS: Normal breath sounds bilaterally, bilateral wheezing, no rales,rhonchi or crepitation. No use of accessory muscles  of respiration.  CARDIOVASCULAR: S1, S2 normal. No murmurs, rubs, or gallops.  ABDOMEN: Soft, nontender, nondistended. Bowel sounds present. No organomegaly or mass.  EXTREMITIES: No pedal edema, cyanosis, or clubbing.  NEUROLOGIC: Cranial nerves II through XII are intact. Muscle strength 5/5 in all extremities. Sensation intact. Gait not checked.  PSYCHIATRIC: The patient is alert and oriented x 3.  SKIN: No obvious rash, lesion, or ulcer.   Physical Exam LABORATORY PANEL:   CBC Recent Labs  Lab 12/15/17 0729  WBC 19.4*  HGB 13.8  HCT 41.6  PLT 391   ------------------------------------------------------------------------------------------------------------------  Chemistries  Recent Labs  Lab 12/14/17 2245 12/15/17 0729  NA 136 138  K 3.8 4.4  CL 97* 104  CO2 27 27  GLUCOSE 151* 186*  BUN 20 19  CREATININE 1.00 0.87  CALCIUM 8.6* 8.4*  AST 41  --   ALT 77*  --   ALKPHOS 100  --   BILITOT 1.9*  --    ------------------------------------------------------------------------------------------------------------------  Cardiac Enzymes Recent Labs  Lab 12/09/17 0010 12/14/17 2245  TROPONINI <0.03 0.04*   ------------------------------------------------------------------------------------------------------------------  RADIOLOGY:  Dg Chest Portable 1 View  Result Date: 12/14/2017 CLINICAL DATA:  Shortness of breath. EXAM: PORTABLE CHEST 1 VIEW COMPARISON:  Radiograph 4 days prior 12/09/2017 FINDINGS: Previous right upper lobe consolidation has near completely resolved with minimal residual patchy opacity. No new airspace disease. Cardiomegaly is unchanged. No pulmonary edema. No pleural effusion or pneumothorax. The previous nodule projecting over the right upper chest is tentatively identified, however obscured by overlying oxygen tubing. IMPRESSION: 1. Near complete resolution of right upper lobe consolidation since prior exam with minimal residual opacity consistent  with resolving pneumonia. 2. Stable cardiomegaly.  No new abnormality. Electronically Signed   By: Jeb Levering M.D.   On: 12/14/2017 23:05    ASSESSMENT AND PLAN:   Active Problems:   Sepsis ([place])  * Acute hypoxic respiratory failure   Acute COPD exacerbation    Recently treated community-acquired pneumonia, resolved.   IV steroid and nebulizer therapy, nebulized steroid.   Azithromycin and ceftriaxone.  * Hyperlipidemia    Atorvastatin.  * Paroxysmal atrial fibrillation   Continue Eliquis  * Hypertension   Continue amlodipine.    All the records are reviewed and case discussed with Care Management/Social Workerr. Management plans discussed with the patient, family and they are in agreement.  CODE STATUS: full.  TOTAL TIME TAKING CARE OF THIS PATIENT: 35 minutes.     POSSIBLE D/C IN 1-2 DAYS, DEPENDING ON CLINICAL CONDITION.   Vaughan Basta M.D on 12/15/2017   Between 7am to 6pm - Pager - [phone]  After 6pm go to [email or web address] - password [email or web address]  Office  [phone]  CC: Primary care physician; Derinda Late, MD  Note: This dictation was prepared with Dragon dictation along with smaller phrase technology. Any transcriptional errors that result from this process are unintentional.

## 2017-12-15 NOTE — ED Notes (Signed)
Admitting MD at bedside.

## 2017-12-15 NOTE — Progress Notes (Addendum)
Pharmacy Antibiotic Note  David Hardin is a 84 y.o. male admitted on 12/14/2017 with pneumonia.  Pharmacy has been consulted for vanc/cefepime then consult switched to azithromycin/ceftriaxone dosing.  Plan: patient received vanc 1g and cefepime 2g IV x 1 in ED  Vanc and cefepime being d/c'd and starting: Azithromycin 500 mg and ceftriaxone 2g IV daily  Temp (24hrs), Avg:100.2 F (37.9 C), Min:99.4 F (37.4 C), Max:100.9 F (38.3 C)  Recent Labs  Lab 12/09/17 0010 12/14/17 2245  WBC 18.2* 21.2*  CREATININE 0.96 1.00  LATICACIDVEN  --  1.3    Estimated Creatinine Clearance: 55.6 mL/min (by C-G formula based on SCr of 1 mg/dL).    No Known Allergies  Thank you for allowing pharmacy to be a part of this patient's care.  Tobie Lords, PharmD, BCPS Clinical Pharmacist 12/15/2017

## 2017-12-15 NOTE — Plan of Care (Signed)
  Clinical Measurements: Ability to maintain clinical measurements within normal limits will improve 12/15/2017 1743 - Progressing by Roanna Epley D, RN  Pt WBC trending down today from 21.2 to 19.4.  Clinical Measurements: Respiratory complications will improve 12/15/2017 1743 - Progressing by Roanna Epley D, RN  Pt is able to maintain O2 saturations above 95% while on room air.

## 2017-12-15 NOTE — H&P (Addendum)
[place] at [place] NAME: David Hardin    MR#:  [id number]  DATE OF BIRTH:  [birth year]  DATE OF ADMISSION:  12/14/2017  PRIMARY CARE PHYSICIAN: Derinda Late, MD   REQUESTING/REFERRING PHYSICIAN:   CHIEF COMPLAINT:   Chief Complaint  Patient presents with  . Shortness of Breath    HISTORY OF PRESENT ILLNESS: David Hardin  is a 84 y.o. male with a known history of COPD, hypertension presented to the emergency room with cough, shortness of breath and chills.  Patient was recently seen at [facility] emergency room and was given amoxicillin and Zithromax and oral steroids for pneumonia.  He still has some cough and chills low-grade fever.  Has wheezing in both the lungs.  Patient is not on home oxygen.  Patient was put on oxygen via nasal cannula in the emergency room.  No complaints of any chest pain.  Patient was given IV antibiotics in the emergency room and given IV steroids.  His lactic acid level was normal.  Patient was given fluid boluses in the emergency room based on sepsis protocol initially but his lactic acid level came back normal and his chest x-ray revealed resolving pneumonia.  Patient appeared to be in COPD flareup.  PAST MEDICAL HISTORY:   Past Medical History:  Diagnosis Date  . COPD (chronic obstructive pulmonary disease) ([place])   . Hypertension     PAST SURGICAL HISTORY:  Past Surgical History:  Procedure Laterality Date  . BYPASS AXILLA/BRACHIAL ARTERY    . CHOLECYSTECTOMY    . HIP SURGERY Left     SOCIAL HISTORY:  Social History   Tobacco Use  . Smoking status: Former Research scientist (life sciences)  . Smokeless tobacco: Never Used  Substance Use Topics  . Alcohol use: Yes    FAMILY HISTORY:  Family History  Problem Relation Age of Onset  . Cancer Mother   . Heart attack Father     DRUG ALLERGIES: No Known Allergies  REVIEW OF SYSTEMS:   CONSTITUTIONAL: Low grade fever, fatigue and weakness.  Chills present. EYES: No  blurred or double vision.  EARS, NOSE, AND THROAT: No tinnitus or ear pain.  RESPIRATORY: Has cough, shortness of breath, wheezing  No hemoptysis.  CARDIOVASCULAR: No chest pain, orthopnea, edema.  GASTROINTESTINAL: No nausea, vomiting, diarrhea or abdominal pain.  GENITOURINARY: No dysuria, hematuria.  ENDOCRINE: No polyuria, nocturia,  HEMATOLOGY: No anemia, easy bruising or bleeding SKIN: No rash or lesion. MUSCULOSKELETAL: No joint pain or arthritis.   NEUROLOGIC: No tingling, numbness, weakness.  PSYCHIATRY: No anxiety or depression.   MEDICATIONS AT HOME:  Prior to Admission medications   Medication Sig Start Date End Date Taking? Authorizing Provider  amLODipine (NORVASC) 5 MG tablet Take 5 mg by mouth daily.   Yes [provider]  amoxicillin (AMOXIL) 500 MG tablet Take 2 tablets (1,000 mg total) by mouth 2 (two) times daily for 7 days. 12/09/17 12/16/17 Yes Darel Hong, MD  atorvastatin (LIPITOR) 20 MG tablet Take 20 mg by mouth daily.   Yes [provider]  ELIQUIS 5 MG TABS tablet Take 5 mg by mouth 2 (two) times daily.   Yes [provider]  furosemide (LASIX) 20 MG tablet Take 20 mg by mouth daily.   Yes [provider]  HYDROcodone-homatropine (HYCODAN) 5-1.5 MG/5ML syrup Take 5 mLs by mouth every 6 (six) hours as needed. 12/13/17 12/23/17 Yes [provider]  ipratropium (ATROVENT HFA) 17 MCG/ACT inhaler Inhale 2 puffs into  the lungs every 6 (six) hours.   Yes [provider]  VENTOLIN HFA 108 (90 Base) MCG/ACT inhaler Inhale 2 puffs into the lungs every 6 (six) hours as needed. 12/13/17  Yes [provider]  azithromycin (ZITHROMAX Z-PAK) 250 MG tablet Take 2 tablets (500 mg) on  Day 1,  followed by 1 tablet (250 mg) once daily on Days 2 through 5. Patient not taking: Reported on 12/14/2017 12/09/17   Darel Hong, MD      PHYSICAL EXAMINATION:   VITAL SIGNS: Blood pressure (!) 178/80, pulse 80, temperature  98.4 F (36.9 C), temperature source Oral, resp. rate 17, SpO2 94 %.  GENERAL:  84 y.o.-year-old patient lying in the bed with no acute distress.  EYES: Pupils equal, round, reactive to light and accommodation. No scleral icterus. Extraocular muscles intact.  HEENT: Head atraumatic, normocephalic. Oropharynx and nasopharynx clear.  NECK:  Supple, no jugular venous distention. No thyroid enlargement, no tenderness.  LUNGS: Decreased breath sounds bilaterally, bilateral wheezing, scattered rales heard. No use of accessory muscles of respiration.  CARDIOVASCULAR: S1, S2 normal. No murmurs, rubs, or gallops.  ABDOMEN: Soft, nontender, nondistended. Bowel sounds present. No organomegaly or mass.  EXTREMITIES: No pedal edema, cyanosis, or clubbing.  NEUROLOGIC: Cranial nerves II through XII are intact. Muscle strength 5/5 in all extremities. Sensation intact. Gait not checked.  PSYCHIATRIC: The patient is alert and oriented x 3.  SKIN: No obvious rash, lesion, or ulcer.   LABORATORY PANEL:   CBC Recent Labs  Lab 12/09/17 0010 12/14/17 2245  WBC 18.2* 21.2*  HGB 14.0 14.7  HCT 42.4 43.6  PLT 324 431  MCV 91.0 90.2  MCH 30.1 30.4  MCHC 33.1 33.7  RDW 14.2 14.3  LYMPHSABS 0.9* 0.4*  MONOABS 1.8* 3.6*  EOSABS 0.0 0.0  BASOSABS 0.1 0.0   ------------------------------------------------------------------------------------------------------------------  Chemistries  Recent Labs  Lab 12/09/17 0010 12/14/17 2245  NA 139 136  K 3.3* 3.8  CL 104 97*  CO2 23 27  GLUCOSE 150* 151*  BUN 20 20  CREATININE 0.96 1.00  CALCIUM 8.7* 8.6*  AST  --  41  ALT  --  77*  ALKPHOS  --  100  BILITOT  --  1.9*   ------------------------------------------------------------------------------------------------------------------ estimated creatinine clearance is 55.6 mL/min (by C-G formula based on SCr of 1  mg/dL). ------------------------------------------------------------------------------------------------------------------ No results for input(s): TSH, T4TOTAL, T3FREE, THYROIDAB in the last 72 hours.  Invalid input(s): FREET3   Coagulation profile No results for input(s): INR, PROTIME in the last 168 hours. ------------------------------------------------------------------------------------------------------------------- No results for input(s): DDIMER in the last 72 hours. -------------------------------------------------------------------------------------------------------------------  Cardiac Enzymes Recent Labs  Lab 12/09/17 0010 12/14/17 2245  TROPONINI <0.03 0.04*   ------------------------------------------------------------------------------------------------------------------ Invalid input(s): POCBNP  ---------------------------------------------------------------------------------------------------------------  Urinalysis    Component Value Date/Time   COLORURINE Amber 02/05/2014 1023   APPEARANCEUR Clear 02/05/2014 1023   LABSPEC 1.015 02/05/2014 1023   PHURINE 5.0 02/05/2014 1023   GLUCOSEU Negative 02/05/2014 1023   HGBUR Negative 02/05/2014 1023   BILIRUBINUR 1+ 02/05/2014 1023   KETONESUR Negative 02/05/2014 1023   PROTEINUR Negative 02/05/2014 1023   NITRITE Negative 02/05/2014 1023   LEUKOCYTESUR Negative 02/05/2014 1023     RADIOLOGY: Dg Chest Portable 1 View  Result Date: 12/14/2017 CLINICAL DATA:  Shortness of breath. EXAM: PORTABLE CHEST 1 VIEW COMPARISON:  Radiograph 4 days prior 12/09/2017 FINDINGS: Previous right upper lobe consolidation has near completely resolved with minimal residual patchy opacity. No new airspace disease. Cardiomegaly is unchanged. No pulmonary  edema. No pleural effusion or pneumothorax. The previous nodule projecting over the right upper chest is tentatively identified, however obscured by overlying oxygen tubing.  IMPRESSION: 1. Near complete resolution of right upper lobe consolidation since prior exam with minimal residual opacity consistent with resolving pneumonia. 2. Stable cardiomegaly.  No new abnormality. Electronically Signed   By: Jeb Levering M.D.   On: 12/14/2017 23:05    EKG: Orders placed or performed during the hospital encounter of 12/14/17  . ED EKG within 10 minutes  . ED EKG within 10 minutes  . EKG 12-Lead  . EKG 12-Lead    IMPRESSION AND PLAN: 84 year old elderly male patient with history of COPD not on home oxygen, hyperlipidemia, hypertension presented to the emergency room with cough and increased shortness of breath.  Admitting diagnosis 1.  Acute COPD exacerbation 2.  community-acquired pneumonia 3.  Leukocytosis secondary to steroids 4.  Hyperlipidemia 5.  Hypertension Treatment plan Admit patient to medical floor Start patient on IV Solu-Medrol 125 mg every 6 hourly Oxygen via nasal cannula Nebulization treatments IV Rocephin and Zithromax antibiotic Follow-up WBC count  All the records are reviewed and case discussed with ED provider. Management plans discussed with the patient, family and they are in agreement.  CODE STATUS:FULL CODE    Code Status Orders  (From admission, onward)        Start     Ordered   12/15/17 0226  Full code  Continuous     12/15/17 0225    Code Status History    Date Active Date Inactive Code Status Order ID Comments User Context   This patient has a current code status but no historical code status.    Advance Directive Documentation     Most Recent Value  Type of Advance Directive  Healthcare Power of Attorney, Living will  Pre-existing out of facility DNR order (yellow form or pink MOST form)  No data  "MOST" Form in Place?  No data       TOTAL TIME TAKING CARE OF THIS PATIENT: 51 minutes.    Saundra Shelling M.D on 12/15/2017 at 2:29 AM  Between 7am to 6pm - Pager - [phone]  After 6pm go to  [email or web address] - password [email or web address] [facility]  [place] [facility]  Office  [phone]  CC: Primary care physician; Derinda Late, MD

## 2017-12-15 NOTE — Progress Notes (Addendum)
CODE SEPSIS - PHARMACY COMMUNICATION  **Broad Spectrum Antibiotics should be administered within 1 hour of Sepsis diagnosis**  Time Code Sepsis Called/Page Received: 2333  Antibiotics Ordered: vanc/cefepime  Time of 1st antibiotic administration: 0007  Additional action taken by pharmacy: none  If necessary, Name of Provider/Nurse Contacted: none    Tobie Lords ,PharmD Clinical Pharmacist  12/15/2017  12:11 AM

## 2017-12-16 LAB — GLUCOSE, CAPILLARY: GLUCOSE-CAPILLARY: 160 mg/dL — AB (ref 65–99)

## 2017-12-16 LAB — URINE CULTURE: Culture: NO GROWTH

## 2017-12-16 MED ORDER — AZITHROMYCIN 250 MG PO TABS: 250.0000 mg | ORAL_TABLET | Freq: Every day | ORAL | 0 refills | Status: AC

## 2017-12-16 MED ORDER — FLUTICASONE-SALMETEROL 100-50 MCG/DOSE IN AEPB: 1.0000 | INHALATION_SPRAY | Freq: Two times a day (BID) | RESPIRATORY_TRACT | 0 refills | Status: DC

## 2017-12-16 MED ORDER — PREDNISONE 10 MG (21) PO TBPK: ORAL_TABLET | ORAL | 0 refills | Status: DC

## 2017-12-16 MED ORDER — AZITHROMYCIN 250 MG PO TABS: 250.0000 mg | ORAL_TABLET | Freq: Every day | ORAL | 0 refills | Status: DC

## 2017-12-16 MED ORDER — CEFUROXIME AXETIL 250 MG PO TABS: 250.0000 mg | ORAL_TABLET | Freq: Two times a day (BID) | ORAL | 0 refills | Status: AC

## 2017-12-16 NOTE — Progress Notes (Signed)
Pt d/c to home today.  IV removed intact.  D/c paperwork printed and reviewed w/pt.  All medication questions and concerns reviewed and pt states understanding.  All Rx's given to patient. Pt requested to wheelchaired out for d/c.    

## 2017-12-16 NOTE — Plan of Care (Signed)
  Progressing Clinical Measurements: Ability to maintain clinical measurements within normal limits will improve 12/16/2017 1216 - Progressing by Liliane Channel, RN Respiratory complications will improve 12/16/2017 1216 - Progressing by Liliane Channel, RN Safety: Ability to remain free from injury will improve 12/16/2017 1216 - Progressing by Liliane Channel, RN

## 2017-12-19 LAB — CULTURE, BLOOD (ROUTINE X 2)
CULTURE: NO GROWTH
Culture: NO GROWTH
SPECIAL REQUESTS: ADEQUATE
Special Requests: ADEQUATE

## 2017-12-20 NOTE — Discharge Summary (Signed)
[place] at [place] NAME: David Hardin    MR#:  [id number]  DATE OF BIRTH:  [birth year]  DATE OF ADMISSION:  12/14/2017 ADMITTING PHYSICIAN: Amelia Jo, MD  DATE OF DISCHARGE: 12/16/2017 12:39 PM  PRIMARY CARE PHYSICIAN: Derinda Late, MD    ADMISSION DIAGNOSIS:  Hypoxia [R09.02] COPD exacerbation ([place]) [J44.1] Sepsis, due to unspecified organism ([place]) [A41.9] Community acquired pneumonia of right upper lobe of lung ([place]) [J18.1]  DISCHARGE DIAGNOSIS:  Active Problems:  COPD exacerbation   SECONDARY DIAGNOSIS:   Past Medical History:  Diagnosis Date  . Atrial fibrillation ([place])   . COPD (chronic obstructive pulmonary disease) ([place])   . Coronary artery disease   . Hyperlipidemia   . Hypertension     HOSPITAL COURSE:   * Acute hypoxic respiratory failure   Acute COPD exacerbation    Recently treated community-acquired pneumonia, resolved.   IV steroid and nebulizer therapy, nebulized steroid.   Azithromycin and ceftriaxone.   Felt better next day.   Sepsis was suspected on admission, but ruled out.  * Hyperlipidemia    Atorvastatin.  * Paroxysmal atrial fibrillation   Continue Eliquis  * Hypertension   Continue amlodipine.   DISCHARGE CONDITIONS:   Stable.  CONSULTS OBTAINED:    DRUG ALLERGIES:  No Known Allergies  DISCHARGE MEDICATIONS:   Allergies as of 12/16/2017   No Known Allergies     Medication List    STOP taking these medications   amoxicillin 500 MG tablet Commonly known as:  AMOXIL   azithromycin 250 MG tablet Commonly known as:  ZITHROMAX Z-PAK     TAKE these medications   amLODipine 5 MG tablet Commonly known as:  NORVASC Take 5 mg by mouth daily.   atorvastatin 20 MG tablet Commonly known as:  LIPITOR Take 20 mg by mouth daily.   ELIQUIS 5 MG Tabs tablet Generic drug:  apixaban Take 5 mg by mouth 2 (two) times daily.   Fluticasone-Salmeterol 100-50 MCG/DOSE  Aepb Commonly known as:  ADVAIR DISKUS Inhale 1 puff into the lungs 2 (two) times daily.   furosemide 20 MG tablet Commonly known as:  LASIX Take 20 mg by mouth daily.   HYDROcodone-homatropine 5-1.5 MG/5ML syrup Commonly known as:  HYCODAN Take 5 mLs by mouth every 6 (six) hours as needed.   ipratropium 17 MCG/ACT inhaler Commonly known as:  ATROVENT HFA Inhale 2 puffs into the lungs every 6 (six) hours.   predniSONE 10 MG (21) Tbpk tablet Commonly known as:  STERAPRED UNI-PAK 21 TAB Take 6 tabs first day, 5 tab on day 2, then 4 on day 3rd, 3 tabs on day 4th , 2 tab on day 5th, and 1 tab on 6th day.   VENTOLIN HFA 108 (90 Base) MCG/ACT inhaler Generic drug:  albuterol Inhale 2 puffs into the lungs every 6 (six) hours as needed.     ASK your doctor about these medications   azithromycin 250 MG tablet Commonly known as:  ZITHROMAX Z-PAK Take 1 tablet (250 mg total) by mouth daily for 3 days. Ask about: Should I take this medication?   cefUROXime 250 MG tablet Commonly known as:  CEFTIN Take 1 tablet (250 mg total) by mouth 2 (two) times daily for 3 days. Ask about: Should I take this medication?        DISCHARGE INSTRUCTIONS:    Follow with PMD in 1-2 weeks.  If you experience worsening of your admission symptoms, develop shortness  of breath, life threatening emergency, suicidal or homicidal thoughts you must seek medical attention immediately by calling 911 or calling your MD immediately  if symptoms less severe.  You Must read complete instructions/literature along with all the possible adverse reactions/side effects for all the Medicines you take and that have been prescribed to you. Take any new Medicines after you have completely understood and accept all the possible adverse reactions/side effects.   Please note  You were cared for by a hospitalist during your hospital stay. If you have any questions about your discharge medications or the care you received  while you were in the hospital after you are discharged, you can call the unit and asked to speak with the hospitalist on call if the hospitalist that took care of you is not available. Once you are discharged, your primary care physician will handle any further medical issues. Please note that NO REFILLS for any discharge medications will be authorized once you are discharged, as it is imperative that you return to your primary care physician (or establish a relationship with a primary care physician if you do not have one) for your aftercare needs so that they can reassess your need for medications and monitor your lab values.    Today   CHIEF COMPLAINT:   Chief Complaint  Patient presents with  . Shortness of Breath    HISTORY OF PRESENT ILLNESS:  David Hardin  is a 84 y.o. male with a known history of COPD, hypertension presented to the emergency room with cough, shortness of breath and chills.  Patient was recently seen at [facility] emergency room and was given amoxicillin and Zithromax and oral steroids for pneumonia.  He still has some cough and chills low-grade fever.  Has wheezing in both the lungs.  Patient is not on home oxygen.  Patient was put on oxygen via nasal cannula in the emergency room.  No complaints of any chest pain.  Patient was given IV antibiotics in the emergency room and given IV steroids.  His lactic acid level was normal.  Patient was given fluid boluses in the emergency room based on sepsis protocol initially but his lactic acid level came back normal and his chest x-ray revealed resolving pneumonia.  Patient appeared to be in COPD flareup.   VITAL SIGNS:  Blood pressure 128/61, pulse 78, temperature 98.2 F (36.8 C), temperature source Oral, resp. rate 16, height 5\' 7"  (1.702 m), weight 99.2 kg (218 lb 9.6 oz), SpO2 94 %.  I/O:  No intake or output data in the 24 hours ending 12/20/17 1109  PHYSICAL EXAMINATION:  GENERAL:  84 y.o.-year-old patient lying in the bed  with no acute distress.  EYES: Pupils equal, round, reactive to light and accommodation. No scleral icterus. Extraocular muscles intact.  HEENT: Head atraumatic, normocephalic. Oropharynx and nasopharynx clear.  NECK:  Supple, no jugular venous distention. No thyroid enlargement, no tenderness.  LUNGS: Normal breath sounds bilaterally, no wheezing, rales,rhonchi or crepitation. No use of accessory muscles of respiration.  CARDIOVASCULAR: S1, S2 normal. No murmurs, rubs, or gallops.  ABDOMEN: Soft, non-tender, non-distended. Bowel sounds present. No organomegaly or mass.  EXTREMITIES: No pedal edema, cyanosis, or clubbing.  NEUROLOGIC: Cranial nerves II through XII are intact. Muscle strength 5/5 in all extremities. Sensation intact. Gait not checked.  PSYCHIATRIC: The patient is alert and oriented x 3.  SKIN: No obvious rash, lesion, or ulcer.   DATA REVIEW:   CBC Recent Labs  Lab 12/15/17 0729  WBC  19.4*  HGB 13.8  HCT 41.6  PLT 391    Chemistries  Recent Labs  Lab 12/14/17 2245 12/15/17 0729  NA 136 138  K 3.8 4.4  CL 97* 104  CO2 27 27  GLUCOSE 151* 186*  BUN 20 19  CREATININE 1.00 0.87  CALCIUM 8.6* 8.4*  AST 41  --   ALT 77*  --   ALKPHOS 100  --   BILITOT 1.9*  --     Cardiac Enzymes Recent Labs  Lab 12/14/17 2245  TROPONINI 0.04*    Microbiology Results  Results for orders placed or performed during the hospital encounter of 12/14/17  Urine culture     Status: None   Collection Time: 12/14/17 11:33 AM  Result Value Ref Range Status   Specimen Description   Final    URINE, RANDOM Performed at [facility], [address], [place], [place] [address]    Special Requests   Final    NONE Performed at [facility], [address] [place], [place] [address]    Culture   Final    NO GROWTH Performed at [facility], [place] [address], [place], [place] [address]    Report Status 12/16/2017 FINAL  Final  Blood culture (routine x  2)     Status: None   Collection Time: 12/14/17 10:45 PM  Result Value Ref Range Status   Specimen Description BLOOD LEFT ANTECUBITAL  Final   Special Requests   Final    BOTTLES DRAWN AEROBIC AND ANAEROBIC Blood Culture adequate volume   Culture   Final    NO GROWTH 5 DAYS Performed at [facility], [address], [place], [place] [address]    Report Status 12/19/2017 FINAL  Final  Blood culture (routine x 2)     Status: None   Collection Time: 12/14/17 10:45 PM  Result Value Ref Range Status   Specimen Description BLOOD LEFT FOREARM  Final   Special Requests   Final    BOTTLES DRAWN AEROBIC AND ANAEROBIC Blood Culture adequate volume   Culture   Final    NO GROWTH 5 DAYS Performed at [facility], [address] [place], [place] [address]    Report Status 12/19/2017 FINAL  Final    RADIOLOGY:  No results found.  EKG:   Orders placed or performed during the hospital encounter of 12/14/17  . ED EKG within 10 minutes  . ED EKG within 10 minutes  . EKG 12-Lead  . EKG 12-Lead      Management plans discussed with the patient, family and they are in agreement.  CODE STATUS:  Code Status History    Date Active Date Inactive Code Status Order ID Comments User Context   12/15/2017 02:25 12/16/2017 16:06 Full Code [id number]  Amelia Jo, MD Inpatient    Advance Directive Documentation     Most Recent Value  Type of Advance Directive  Living will, Healthcare Power of Attorney  Pre-existing out of facility DNR order (yellow form or pink MOST form)  No data  "MOST" Form in Place?  No data      TOTAL TIME TAKING CARE OF THIS PATIENT: 35 minutes.    Vaughan Basta M.D on 12/20/2017 at 11:09 AM  Between 7am to 6pm - Pager - [phone]  After 6pm go to [email or web address] - password [email or web address] [place] [facility]  Office  [phone]  CC: Primary care physician; Derinda Late, MD   Note: This dictation  was prepared with Dragon  dictation along with smaller phrase technology. Any transcriptional errors that result from this process are unintentional.

## 2018-12-11 ENCOUNTER — Other Ambulatory Visit: Payer: Self-pay

## 2018-12-11 ENCOUNTER — Inpatient Hospital Stay
Admission: EM | Admit: 2018-12-11 | Discharge: 2018-12-13 | DRG: 190 | Disposition: A | Discharge status: Home / Self Care | Payer: Medicare HMO | Attending: Specialist | Admitting: Specialist

## 2018-12-11 ENCOUNTER — Emergency Department: Payer: Medicare HMO

## 2018-12-11 ENCOUNTER — Encounter: Payer: Self-pay | Admitting: Emergency Medicine

## 2018-12-11 DIAGNOSIS — Z7952 Long term (current) use of systemic steroids: Secondary | ICD-10-CM | POA: Diagnosis not present

## 2018-12-11 DIAGNOSIS — J441 Chronic obstructive pulmonary disease with (acute) exacerbation: Secondary | ICD-10-CM | POA: Diagnosis not present

## 2018-12-11 DIAGNOSIS — I10 Essential (primary) hypertension: Secondary | ICD-10-CM | POA: Diagnosis present

## 2018-12-11 DIAGNOSIS — Z8249 Family history of ischemic heart disease and other diseases of the circulatory system: Secondary | ICD-10-CM | POA: Diagnosis not present

## 2018-12-11 DIAGNOSIS — J440 Chronic obstructive pulmonary disease with acute lower respiratory infection: Secondary | ICD-10-CM | POA: Diagnosis present

## 2018-12-11 DIAGNOSIS — Z7901 Long term (current) use of anticoagulants: Secondary | ICD-10-CM

## 2018-12-11 DIAGNOSIS — I2510 Atherosclerotic heart disease of native coronary artery without angina pectoris: Secondary | ICD-10-CM | POA: Diagnosis present

## 2018-12-11 DIAGNOSIS — J449 Chronic obstructive pulmonary disease, unspecified: Secondary | ICD-10-CM | POA: Diagnosis present

## 2018-12-11 DIAGNOSIS — Z87891 Personal history of nicotine dependence: Secondary | ICD-10-CM | POA: Diagnosis not present

## 2018-12-11 DIAGNOSIS — I480 Paroxysmal atrial fibrillation: Secondary | ICD-10-CM | POA: Diagnosis present

## 2018-12-11 DIAGNOSIS — J189 Pneumonia, unspecified organism: Secondary | ICD-10-CM | POA: Diagnosis present

## 2018-12-11 DIAGNOSIS — E785 Hyperlipidemia, unspecified: Secondary | ICD-10-CM | POA: Diagnosis present

## 2018-12-11 DIAGNOSIS — Z79899 Other long term (current) drug therapy: Secondary | ICD-10-CM

## 2018-12-11 LAB — CBC WITH DIFFERENTIAL/PLATELET
Abs Immature Granulocytes: 0.06 10*3/uL (ref 0.00–0.07)
BASOS PCT: 0 %
Basophils Absolute: 0 10*3/uL (ref 0.0–0.1)
EOS ABS: 0.1 10*3/uL (ref 0.0–0.5)
Eosinophils Relative: 2 %
HCT: 40.4 % (ref 39.0–52.0)
Hemoglobin: 13.4 g/dL (ref 13.0–17.0)
IMMATURE GRANULOCYTES: 1 %
Lymphocytes Relative: 4 %
Lymphs Abs: 0.4 10*3/uL — ABNORMAL LOW (ref 0.7–4.0)
MCH: 30.6 pg (ref 26.0–34.0)
MCHC: 33.2 g/dL (ref 30.0–36.0)
MCV: 92.2 fL (ref 80.0–100.0)
Monocytes Absolute: 0.7 10*3/uL (ref 0.1–1.0)
Monocytes Relative: 8 %
NEUTROS PCT: 85 %
NRBC: 0 % (ref 0.0–0.2)
Neutro Abs: 7.8 10*3/uL — ABNORMAL HIGH (ref 1.7–7.7)
PLATELETS: 351 10*3/uL (ref 150–400)
RBC: 4.38 MIL/uL (ref 4.22–5.81)
RDW: 14.1 % (ref 11.5–15.5)
WBC: 9.2 10*3/uL (ref 4.0–10.5)

## 2018-12-11 LAB — BASIC METABOLIC PANEL
Anion gap: 11 (ref 5–15)
BUN: 19 mg/dL (ref 8–23)
CHLORIDE: 102 mmol/L (ref 98–111)
CO2: 25 mmol/L (ref 22–32)
Calcium: 8.6 mg/dL — ABNORMAL LOW (ref 8.9–10.3)
Creatinine, Ser: 0.94 mg/dL (ref 0.61–1.24)
GFR calc Af Amer: >60 mL/min (ref >60)
GFR calc non Af Amer: >60 mL/min (ref >60)
Glucose, Bld: 120 mg/dL — ABNORMAL HIGH (ref 70–99)
Potassium: 3.5 mmol/L (ref 3.5–5.1)
SODIUM: 138 mmol/L (ref 135–145)

## 2018-12-11 LAB — PROTIME-INR
INR: 1.56
Prothrombin Time: 18.5 seconds — ABNORMAL HIGH (ref 11.4–15.2)

## 2018-12-11 LAB — LACTIC ACID, PLASMA: LACTIC ACID, VENOUS: 1.5 mmol/L (ref 0.5–1.9)

## 2018-12-11 LAB — APTT: aPTT: 38 seconds — ABNORMAL HIGH (ref 24–36)

## 2018-12-11 LAB — BRAIN NATRIURETIC PEPTIDE: B NATRIURETIC PEPTIDE 5: 332 pg/mL — AB (ref 0.0–100.0)

## 2018-12-11 LAB — TROPONIN I: Troponin I: 0.03 ng/mL (ref <0.03)

## 2018-12-11 LAB — INFLUENZA PANEL BY PCR (TYPE A & B)
Influenza A By PCR: NEGATIVE
Influenza B By PCR: NEGATIVE

## 2018-12-11 MED ORDER — IPRATROPIUM-ALBUTEROL 0.5-2.5 (3) MG/3ML IN SOLN
3.0000 mL | Freq: Once | RESPIRATORY_TRACT | Status: AC
  Administered 2018-12-11: 3 mL via RESPIRATORY_TRACT
  Filled 2018-12-11: qty 9

## 2018-12-11 MED ORDER — AMLODIPINE BESYLATE 5 MG PO TABS
5.0000 mg | ORAL_TABLET | Freq: Every day | ORAL | Status: DC
  Administered 2018-12-12 – 2018-12-13 (×2): 5 mg via ORAL
  Filled 2018-12-11 (×2): qty 1

## 2018-12-11 MED ORDER — IPRATROPIUM-ALBUTEROL 0.5-2.5 (3) MG/3ML IN SOLN
3.0000 mL | Freq: Once | RESPIRATORY_TRACT | Status: AC
  Administered 2018-12-11: 3 mL via RESPIRATORY_TRACT

## 2018-12-11 MED ORDER — ATORVASTATIN CALCIUM 20 MG PO TABS
20.0000 mg | ORAL_TABLET | Freq: Every day | ORAL | Status: DC
  Administered 2018-12-12 – 2018-12-13 (×2): 20 mg via ORAL
  Filled 2018-12-11 (×2): qty 1

## 2018-12-11 MED ORDER — OCUVITE-LUTEIN PO CAPS
1.0000 | ORAL_CAPSULE | Freq: Two times a day (BID) | ORAL | Status: DC
  Administered 2018-12-12 (×2): 1 via ORAL
  Filled 2018-12-11 (×4): qty 1

## 2018-12-11 MED ORDER — ONDANSETRON HCL 4 MG/2ML IJ SOLN: 4.0000 mg | Freq: Four times a day (QID) | INTRAMUSCULAR | Status: DC | PRN

## 2018-12-11 MED ORDER — HYDROCOD POLST-CPM POLST ER 10-8 MG/5ML PO SUER
2.5000 mL | Freq: Once | ORAL | Status: AC
Start: 2018-12-11 — End: 2018-12-11
  Administered 2018-12-11: 2.5 mL via ORAL
  Filled 2018-12-11: qty 5

## 2018-12-11 MED ORDER — SODIUM CHLORIDE 0.9 % IV SOLN
INTRAVENOUS | Status: DC | PRN
  Administered 2018-12-11 – 2018-12-12 (×2): 250 mL via INTRAVENOUS

## 2018-12-11 MED ORDER — SODIUM CHLORIDE 0.9 % IV SOLN
1.0000 g | INTRAVENOUS | Status: DC
  Administered 2018-12-11 – 2018-12-12 (×2): 1 g via INTRAVENOUS
  Filled 2018-12-11 (×2): qty 10
  Filled 2018-12-11 (×2): qty 1

## 2018-12-11 MED ORDER — IPRATROPIUM-ALBUTEROL 0.5-2.5 (3) MG/3ML IN SOLN
3.0000 mL | Freq: Once | RESPIRATORY_TRACT | Status: AC
  Administered 2018-12-11: 3 mL via RESPIRATORY_TRACT
  Filled 2018-12-11: qty 3

## 2018-12-11 MED ORDER — IPRATROPIUM-ALBUTEROL 0.5-2.5 (3) MG/3ML IN SOLN
3.0000 mL | Freq: Four times a day (QID) | RESPIRATORY_TRACT | Status: DC
  Administered 2018-12-11 – 2018-12-13 (×8): 3 mL via RESPIRATORY_TRACT
  Filled 2018-12-11 (×9): qty 3

## 2018-12-11 MED ORDER — METHYLPREDNISOLONE SODIUM SUCC 40 MG IJ SOLR
40.0000 mg | Freq: Two times a day (BID) | INTRAMUSCULAR | Status: DC
  Administered 2018-12-11 – 2018-12-13 (×4): 40 mg via INTRAVENOUS
  Filled 2018-12-11 (×4): qty 1

## 2018-12-11 MED ORDER — ONDANSETRON HCL 4 MG PO TABS: 4.0000 mg | ORAL_TABLET | Freq: Four times a day (QID) | ORAL | Status: DC | PRN

## 2018-12-11 MED ORDER — FLUTICASONE PROPIONATE 50 MCG/ACT NA SUSP
1.0000 | Freq: Two times a day (BID) | NASAL | Status: DC
  Administered 2018-12-11 – 2018-12-13 (×4): 1 via NASAL
  Filled 2018-12-11: qty 16

## 2018-12-11 MED ORDER — AZITHROMYCIN 500 MG PO TABS
250.0000 mg | ORAL_TABLET | Freq: Every day | ORAL | Status: DC
  Administered 2018-12-12 – 2018-12-13 (×2): 250 mg via ORAL
  Filled 2018-12-11 (×2): qty 1

## 2018-12-11 MED ORDER — METHYLPREDNISOLONE SODIUM SUCC 125 MG IJ SOLR
60.0000 mg | INTRAMUSCULAR | Status: AC
Start: 2018-12-11 — End: 2018-12-11
  Administered 2018-12-11: 60 mg via INTRAVENOUS
  Filled 2018-12-11: qty 2

## 2018-12-11 MED ORDER — APIXABAN 5 MG PO TABS
5.0000 mg | ORAL_TABLET | Freq: Two times a day (BID) | ORAL | Status: DC
  Administered 2018-12-11 – 2018-12-13 (×4): 5 mg via ORAL
  Filled 2018-12-11 (×4): qty 1

## 2018-12-11 MED ORDER — ACETAMINOPHEN 650 MG RE SUPP: 650.0000 mg | Freq: Four times a day (QID) | RECTAL | Status: DC | PRN

## 2018-12-11 MED ORDER — METHOCARBAMOL 500 MG PO TABS
500.0000 mg | ORAL_TABLET | Freq: Two times a day (BID) | ORAL | Status: DC
  Administered 2018-12-12 – 2018-12-13 (×2): 500 mg via ORAL
  Filled 2018-12-11 (×5): qty 1

## 2018-12-11 MED ORDER — MONTELUKAST SODIUM 10 MG PO TABS
10.0000 mg | ORAL_TABLET | Freq: Every day | ORAL | Status: DC
  Administered 2018-12-11 – 2018-12-12 (×2): 10 mg via ORAL
  Filled 2018-12-11 (×2): qty 1

## 2018-12-11 MED ORDER — ACETAMINOPHEN 325 MG PO TABS: 650.0000 mg | ORAL_TABLET | Freq: Four times a day (QID) | ORAL | Status: DC | PRN

## 2018-12-11 MED ORDER — GUAIFENESIN-DM 100-10 MG/5ML PO SYRP
5.0000 mL | ORAL_SOLUTION | ORAL | Status: DC | PRN
  Administered 2018-12-12: 23:00:00 5 mL via ORAL
  Filled 2018-12-11 (×2): qty 5

## 2018-12-11 MED ORDER — ASPIRIN 81 MG PO CHEW
81.0000 mg | CHEWABLE_TABLET | Freq: Every day | ORAL | Status: DC
  Administered 2018-12-11 – 2018-12-12 (×2): 81 mg via ORAL
  Filled 2018-12-11 (×2): qty 1

## 2018-12-11 MED ORDER — AZITHROMYCIN 500 MG PO TABS
500.0000 mg | ORAL_TABLET | Freq: Every day | ORAL | Status: AC
  Administered 2018-12-11: 15:00:00 500 mg via ORAL
  Filled 2018-12-11: qty 1

## 2018-12-11 MED ORDER — BUDESONIDE 0.5 MG/2ML IN SUSP
0.5000 mg | Freq: Two times a day (BID) | RESPIRATORY_TRACT | Status: DC
  Administered 2018-12-12 – 2018-12-13 (×3): 0.5 mg via RESPIRATORY_TRACT
  Filled 2018-12-11 (×5): qty 2

## 2018-12-11 NOTE — ED Notes (Signed)
Pt being transferred to rm 104 at this time by this tech.

## 2018-12-11 NOTE — ED Triage Notes (Signed)
Pt to ed with c/o sob and worsening difficulty breathing over the last few days.  Pt with sats 91% on RA.  Pt states dx with pneumonia on Monday, started abx but seems to be getting worse. Pt states worsening sob with walking or activity.

## 2018-12-11 NOTE — Progress Notes (Signed)
Pt arrived from the ED A&O x4 on 2 L [place]. VSS. No s/s of distress. Tolerating diet and PO medications. Family at bedside.

## 2018-12-11 NOTE — ED Provider Notes (Signed)
[facility] Emergency Department Provider Note ____________________________________________   First MD Initiated Contact with Patient 12/11/18 1105     (approximate)  I have reviewed the triage vital signs and the nursing notes.  HISTORY  Chief Complaint Shortness of Breath  HPI David Hardin is a 85 y.o. male here for evaluation for increasing shortness of breath and wheezing  Patient reports diagnosed with pneumonia by his primary doctor couple of days ago, he has been on 2 days of prednisone and Levaquin but continues to note that he is wheezing.  Feels short of breath when he is exerting himself.  Coughing frequently  No pain.  Intermittently feeling feverish with chills.  Reports he had a negative flu test, chest x-ray and was placed on Levaquin 2 days ago and seems to not be getting any better in fact getting somewhat worse.  He still eating and drinking well.  No abdominal pain.  No leg swelling except for several years of chronic right lower extremity swelling which is not new.  He does take a anticoagulant daily   Past Medical History:  Diagnosis Date  . Atrial fibrillation ([place])   . COPD (chronic obstructive pulmonary disease) ([place])   . Coronary artery disease   . Hyperlipidemia   . Hypertension     Patient Active Problem List   Diagnosis Date Noted  . Sepsis ([place]) 12/15/2017    Past Surgical History:  Procedure Laterality Date  . BYPASS AXILLA/BRACHIAL ARTERY    . CHOLECYSTECTOMY    . HIP SURGERY Left     Prior to Admission medications   Medication Sig Start Date End Date Taking? Authorizing Provider  albuterol (PROVENTIL HFA;VENTOLIN HFA) 108 (90 Base) MCG/ACT inhaler Inhale 1 puff into the lungs every 6 (six) hours as needed. 11/22/18 11/22/19 Yes [provider]  amLODipine (NORVASC) 5 MG tablet Take 5 mg by mouth daily. 10/21/18  Yes [provider]  apixaban (ELIQUIS) 5 MG TABS tablet Take 5 mg by mouth every 12  (twelve) hours. 11/25/18  Yes [provider]  atorvastatin (LIPITOR) 20 MG tablet Take 20 mg by mouth daily. 05/14/18  Yes [provider]  Fluticasone-Salmeterol (ADVAIR DISKUS) 100-50 MCG/DOSE AEPB Inhale 1 puff into the lungs 2 (two) times daily. 12/16/17 12/16/18  Vaughan Basta, MD  furosemide (LASIX) 20 MG tablet Take 20 mg by mouth daily.    [provider]  ipratropium (ATROVENT HFA) 17 MCG/ACT inhaler Inhale 2 puffs into the lungs every 6 (six) hours.    [provider]  predniSONE (STERAPRED UNI-PAK 21 TAB) 10 MG (21) TBPK tablet Take 6 tabs first day, 5 tab on day 2, then 4 on day 3rd, 3 tabs on day 4th , 2 tab on day 5th, and 1 tab on 6th day. 12/16/17   Vaughan Basta, MD    Allergies Patient has no known allergies.  Family History  Problem Relation Age of Onset  . Cancer Mother   . Heart attack Father     Social History Social History   Tobacco Use  . Smoking status: Former Research scientist (life sciences)  . Smokeless tobacco: Never Used  Substance Use Topics  . Alcohol use: Yes  . Drug use: No    Review of Systems Constitutional: See HPI eyes: No visual changes. ENT: No sore throat. Cardiovascular: Denies chest pain. Respiratory: See HPI.  Not short of breath at rest, short of breath when he exerts himself and noticeably wheezing despite use of inhalers and steroids at  home for 2 days Gastrointestinal: No abdominal pain.   Genitourinary: Negative for dysuria. Musculoskeletal: Negative for back pain. Skin: Negative for rash. Neurological: Negative for headaches, areas of focal weakness or numbness.    ____________________________________________   PHYSICAL EXAM:  VITAL SIGNS: ED Triage Vitals [12/11/18 1058]  Enc Vitals Group     BP (!) 149/79     Pulse Rate 87     Resp 18     Temp 98.1 F (36.7 C)     Temp Source Oral     SpO2 91 %     Weight      Height      Head Circumference      Peak Flow      Pain Score 0     Pain  Loc      Pain Edu?      Excl. in New Home?     Constitutional: Alert and oriented. Well appearing and in no acute distress.  He and his family very pleasant. Eyes: Conjunctivae are normal. Head: Atraumatic. Nose: No congestion/rhinnorhea. Mouth/Throat: Mucous membranes are moist. Neck: No stridor.  Cardiovascular: Normal rate, regular rhythm. Grossly normal heart sounds.  Good peripheral circulation. Respiratory: Slight increased work of breathing.  Notable and expiratory wheezing throughout.  Slight crackles in the bases bilaterally.  No rhonchi.  Occasional dry cough.  Speaks in phrases. Gastrointestinal: Soft and nontender. No distention. Musculoskeletal: No lower extremity tenderness except RLE slight edema.  Neurologic:  Normal speech and language. No gross focal neurologic deficits are appreciated.  Skin:  Skin is warm, dry and intact. No rash noted. Psychiatric: Mood and affect are normal. Speech and behavior are normal.  ____________________________________________   LABS (all labs ordered are listed, but only abnormal results are displayed)  Labs Reviewed  PROTIME-INR - Abnormal; Notable for the following components:      Result Value   Prothrombin Time 18.5 (*)    All other components within normal limits  APTT - Abnormal; Notable for the following components:   aPTT 38 (*)    All other components within normal limits  CBC WITH DIFFERENTIAL/PLATELET - Abnormal; Notable for the following components:   Neutro Abs 7.8 (*)    Lymphs Abs 0.4 (*)    All other components within normal limits  TROPONIN I - Abnormal; Notable for the following components:   Troponin I 0.03 (*)    All other components within normal limits  BRAIN NATRIURETIC PEPTIDE - Abnormal; Notable for the following components:   B Natriuretic Peptide 332.0 (*)    All other components within normal limits  BASIC METABOLIC PANEL - Abnormal; Notable for the following components:   Glucose, Bld 120 (*)    Calcium  8.6 (*)    All other components within normal limits  CULTURE, BLOOD (ROUTINE X 2)  CULTURE, BLOOD (ROUTINE X 2)  LACTIC ACID, PLASMA   ____________________________________________  EKG  Reviewed interpreted 1125 Heart rate 70 Cures 100 QTc 480 A. fib, rate controlled.  No evidence of acute ischemia. ____________________________________________  RADIOLOGY  Chest x-ray reviewed, multi lobar pneumonia predominant in the right ____________________________________________   PROCEDURES  Procedure(s) performed: None  Procedures  Critical Care performed: No  ____________________________________________   INITIAL IMPRESSION / ASSESSMENT AND PLAN / ED COURSE  Pertinent labs & imaging results that were available during my care of the patient were reviewed by me and considered in my medical decision making (see chart for details).   Patient presents for ongoing shortness of breath,  also wheezing.  Clinical history and examination appear consistent with community-acquired pneumonia.  Primarily multilobar in the right lung with slight dyspnea.  White count is improving, flu negative.  I have however concerned as he does have associated COPD exacerbation now with dyspnea that seems to be slightly worsening despite outpatient treatment.  Discussed with patient, given the multi lobar pneumonia with associated COPD exacerbation and mild hypoxia at rest will admit for further care and treatment.  Discussed admission and ongoing antibiotics treatment with Dr. Earleen Newport of the hospitalist service.  The hospitalist will evaluate for plans for additional antibiotics at this time  Patient agreement with plan  ----------------------------------------- 12:47 PM on 12/11/2018 -----------------------------------------  After multiple nebulizer therapies, patient wheezing has improved and he does report shortness of breath is feeling slightly better as well.  Appears stable for admission       ____________________________________________   FINAL CLINICAL IMPRESSION(S) / ED DIAGNOSES  Final diagnoses:  COPD exacerbation ([place])  Community acquired pneumonia of right lung, unspecified part of lung        Note:  This document was prepared using Systems analyst and may include unintentional dictation errors       Delman Kitten, MD 12/11/18 1250

## 2018-12-11 NOTE — H&P (Signed)
[facility] NAME: David Hardin    MR#:  [id number]  DATE OF BIRTH:  [birth year]  DATE OF ADMISSION:  12/11/2018  PRIMARY CARE PHYSICIAN: Derinda Late, MD   REQUESTING/REFERRING PHYSICIAN: Dr. Delman Kitten  CHIEF COMPLAINT:   Chief Complaint  Patient presents with  . Shortness of Breath    HISTORY OF PRESENT ILLNESS:  David Hardin  is a 85 y.o. male was recently diagnosed with pneumonia.  He took his third dose of Levaquin today.  He was also placed on steroids.  Still not feeling any better.  Worsening with shortness of breath.  He has been coughing up gray-brown phlegm.  He is also been wheezing.  Patient states that he has had chills and sweats last night.  Also having runny nose and sore throat.  In the ER he was wheezing quite a bit and hospitalist services were contacted for further evaluation.  PAST MEDICAL HISTORY:   Past Medical History:  Diagnosis Date  . Atrial fibrillation ([place])   . COPD (chronic obstructive pulmonary disease) ([place])   . Coronary artery disease   . Hyperlipidemia   . Hypertension     PAST SURGICAL HISTORY:   Past Surgical History:  Procedure Laterality Date  . BYPASS AXILLA/BRACHIAL ARTERY    . CHOLECYSTECTOMY    . HIP SURGERY Left     SOCIAL HISTORY:   Social History   Tobacco Use  . Smoking status: Former Research scientist (life sciences)  . Smokeless tobacco: Never Used  Substance Use Topics  . Alcohol use: Yes    FAMILY HISTORY:   Family History  Problem Relation Age of Onset  . Breast cancer Mother   . Heart attack Father     DRUG ALLERGIES:  No Known Allergies  REVIEW OF SYSTEMS:  CONSTITUTIONAL: No fever, positive for chills and sweats.  Positive for weight gain.  No fatigue or weakness.  EYES: Wears glasses and has macular degeneration EARS, NOSE, AND THROAT: No tinnitus or ear pain.  Wears hearing aids.  Positive for runny nose and sore throat RESPIRATORY: Positive for cough,  shortness of breath, and wheezing.  No hemoptysis.  CARDIOVASCULAR: No chest pain, orthopnea, edema.  GASTROINTESTINAL: No nausea, vomiting, diarrhea or abdominal pain. No blood in bowel movements.  Some constipation but did have a bowel movement this morning GENITOURINARY: No dysuria, hematuria.  ENDOCRINE: No polyuria, nocturia,  HEMATOLOGY: No anemia, easy bruising or bleeding SKIN: No rash or lesion. MUSCULOSKELETAL: No joint pain or arthritis.   NEUROLOGIC: No tingling, numbness, weakness.  PSYCHIATRY: No anxiety or depression.   MEDICATIONS AT HOME:   Prior to Admission medications   Medication Sig Start Date End Date Taking? Authorizing Provider  albuterol (PROVENTIL HFA;VENTOLIN HFA) 108 (90 Base) MCG/ACT inhaler Inhale 1 puff into the lungs every 6 (six) hours as needed. 11/22/18 11/22/19 Yes [provider]  amLODipine (NORVASC) 5 MG tablet Take 5 mg by mouth daily. 10/21/18  Yes [provider]  apixaban (ELIQUIS) 5 MG TABS tablet Take 5 mg by mouth every 12 (twelve) hours. 11/25/18  Yes [provider]  aspirin 81 MG chewable tablet Chew 81 mg by mouth daily.   Yes [provider]  atorvastatin (LIPITOR) 20 MG tablet Take 20 mg by mouth daily. 05/14/18  Yes [provider]  fluticasone (FLONASE) 50 MCG/ACT nasal spray Place 1 spray into the nose 2 (two) times daily. 04/29/18 04/29/19 Yes [provider]  fluticasone furoate-vilanterol (BREO ELLIPTA) 100-25  MCG/INH AEPB Inhale 1 puff into the lungs daily. 11/22/18  Yes [provider]  furosemide (LASIX) 20 MG tablet Take 20 mg by mouth daily.   Yes [provider]  levofloxacin (LEVAQUIN) 750 MG tablet Take 750 mg by mouth daily. 12/09/18 12/14/18 Yes [provider]  methocarbamol (ROBAXIN) 500 MG tablet Take 500 mg by mouth 2 (two) times daily.   Yes [provider]  montelukast (SINGULAIR) 10 MG tablet Take 10 mg by mouth at bedtime. 12/06/18   Yes [provider]  Multiple Vitamins-Minerals (PRESERVISION AREDS) CAPS Take 1 capsule by mouth 2 (two) times daily.   Yes [provider]  predniSONE (DELTASONE) 20 MG tablet Take 20 mg by mouth daily. 12/09/18  Yes [provider]  sildenafil (REVATIO) 20 MG tablet TAKE 3 TABLETS BY MOUTH ONCE DAILY PRIORTO SEX AS NEEDED 05/23/18  Yes [provider]      VITAL SIGNS:  Blood pressure 127/72, pulse 85, temperature 98.1 F (36.7 C), temperature source Oral, resp. rate 13, SpO2 97 %.  PHYSICAL EXAMINATION:  GENERAL:  85 y.o.-year-old patient lying in the bed with no acute distress.  EYES: Pupils equal, round, reactive to light and accommodation. No scleral icterus. Extraocular muscles intact.  HEENT: Head atraumatic, normocephalic. Oropharynx and nasopharynx clear.  NECK:  Supple, no jugular venous distention. No thyroid enlargement, no tenderness.  LUNGS: Decreased breath sounds bilaterally, positive inspiratory and expiratory wheezing.  No rales,rhonchi or crepitation. No use of accessory muscles of respiration.  CARDIOVASCULAR: S1, S2 irregularly irregular. No murmurs, rubs, or gallops.  ABDOMEN: Soft, nontender, nondistended. Bowel sounds present. No organomegaly or mass.  EXTREMITIES: 2+ pedal edema.  No cyanosis, or clubbing.  NEUROLOGIC: Cranial nerves II through XII are intact. Muscle strength 5/5 in all extremities. Sensation intact. Gait not checked.  PSYCHIATRIC: The patient is alert and oriented x 3.  SKIN: No rash, lesion, or ulcer.   LABORATORY PANEL:   CBC Recent Labs  Lab 12/11/18 1134  WBC 9.2  HGB 13.4  HCT 40.4  PLT 351   ------------------------------------------------------------------------------------------------------------------  Chemistries  Recent Labs  Lab 12/11/18 1134  NA 138  K 3.5  CL 102  CO2 25  GLUCOSE 120*  BUN 19  CREATININE 0.94  CALCIUM 8.6*    ------------------------------------------------------------------------------------------------------------------  Cardiac Enzymes Recent Labs  Lab 12/11/18 1134  TROPONINI 0.03*   ------------------------------------------------------------------------------------------------------------------  RADIOLOGY:  Dg Chest 2 View  Result Date: 12/11/2018 CLINICAL DATA:  Patient with history of pneumonia. Productive cough. EXAM: CHEST - 2 VIEW COMPARISON:  Chest radiograph 12/14/2017 FINDINGS: Monitoring leads overlie the patient. Stable cardiomegaly. Aortic atherosclerosis. Interval development of mid lower lung patchy consolidation. No pleural effusion or pneumothorax. Thoracic spine degenerative changes. IMPRESSION: Right mid and lower lung patchy consolidation concerning for pneumonia. Followup PA and lateral chest X-ray is recommended in 3-4 weeks following trial of antibiotic therapy to ensure resolution and exclude underlying malignancy. Electronically Signed   By: Lovey Newcomer M.D.   On: 12/11/2018 12:18    EKG:   Atrial fibrillation 67 bpm  IMPRESSION AND PLAN:   1.  Right-sided pneumonia.  Partially treated with Levaquin 3 doses as outpatient.  Switch antibiotics to Rocephin and Zithromax.  Obtain a sputum culture.  Follow-up blood cultures.Follow-up checks x-ray needed in 6 weeks to ensure clearing of pneumonia. 2.  COPD exacerbation.  Start Solu-Medrol, budesonide and DuoNeb nebulizer solution. 3.  Paroxysmal atrial fibrillation.  Patient currently in atrial fibrillation on Eliquis. 4.  History of  CAD.  On aspirin and atorvastatin 5.  Hyperlipidemia unspecified on atorvastatin 6.  Hypertension on Norvasc   All the records are reviewed and case discussed with ED provider. Management plans discussed with the patient, family and they are in agreement.  CODE STATUS: Full code  TOTAL TIME TAKING CARE OF THIS PATIENT: 50 minutes, including acp time.    Loletha Grayer M.D on  12/11/2018 at 1:11 PM  Between 7am to 6pm - Pager - [phone]  After 6pm call admission pager [phone]  [facility] [facility]  [phone]  CC: Primary care physician; Derinda Late, MD

## 2018-12-11 NOTE — Progress Notes (Signed)
Patient ID: David Hardin, male   DOB: 1927-04-26, 85 y.o.   MRN: [id number]  ACP note  Patient, wife and daughter at the bedside  Diagnosis: Right-sided pneumonia, COPD exacerbation, paroxysmal atrial fibrillation, history of CAD, hyperlipidemia, hypertension  CODE STATUS discussed and patient wishes to be a full code  Plan.  Switch antibiotics to Rocephin and Zithromax.  Nebulizer treatments and steroids IV.  Continue to monitor during the hospital course.  Time spent on ACP discussion 17 minutes Dr. Loletha Grayer

## 2018-12-11 NOTE — ED Notes (Signed)
ED TO INPATIENT HANDOFF REPORT  Name/Age/Gender David Hardin 85 y.o. male  Code Status    Code Status Orders  (From admission, onward)         Start     Ordered   12/11/18 1310  Full code  Continuous     12/11/18 1310        Code Status History    Date Active Date Inactive Code Status Order ID Comments User Context   12/15/2017 0225 12/16/2017 1606 Full Code [id number]  Amelia Jo, MD Inpatient      Home/SNF/Other Home  Chief Complaint sob  Level of Care/Admitting Diagnosis ED Disposition    ED Disposition Condition [place]: [place] [[id number]]  Level of Care: Med-Surg [16]  Diagnosis: COPD exacerbation [facility]) [[id number]]  Admitting Physician: Loletha Grayer [[id number]]  Attending Physician: Loletha Grayer [phone]  Estimated length of stay: past midnight tomorrow  Certification:: I certify this patient will need inpatient services for at least 2 midnights  PT Class (Do Not Modify): Inpatient [101]  PT Acc Code (Do Not Modify): Private [1]       Medical History Past Medical History:  Diagnosis Date  . Atrial fibrillation ([place])   . COPD (chronic obstructive pulmonary disease) ([place])   . Coronary artery disease   . Hyperlipidemia   . Hypertension     Allergies No Known Allergies  IV Location/Drains/Wounds Patient Lines/Drains/Airways Status   Active Line/Drains/Airways    Name:   Placement date:   Placement time:   Site:   Days:   Peripheral IV 12/11/18 Left Hand   12/11/18    1129    Hand   less than 1   Peripheral IV 12/11/18 Left Antecubital   12/11/18    1134    Antecubital   less than 1          Labs/Imaging Results for orders placed or performed during the hospital encounter of 12/11/18 (from the past 48 hour(s))  Protime-INR     Status: Abnormal   Collection Time: 12/11/18 11:34 AM  Result Value Ref Range   Prothrombin Time 18.5 (H) 11.4 - 15.2 seconds   INR 1.56     Comment: Performed at  [facility], [address] [place], [place] [address]  APTT     Status: Abnormal   Collection Time: 12/11/18 11:34 AM  Result Value Ref Range   aPTT 38 (H) 24 - 36 seconds    Comment:        IF BASELINE aPTT IS ELEVATED, SUGGEST PATIENT RISK ASSESSMENT BE USED TO DETERMINE APPROPRIATE ANTICOAGULANT THERAPY. Performed at [facility], [address] [place], [place] [address]   CBC WITH DIFFERENTIAL     Status: Abnormal   Collection Time: 12/11/18 11:34 AM  Result Value Ref Range   WBC 9.2 4.0 - 10.5 K/uL   RBC 4.38 4.22 - 5.81 MIL/uL   Hemoglobin 13.4 13.0 - 17.0 g/dL   HCT 40.4 39.0 - 52.0 %   MCV 92.2 80.0 - 100.0 fL   MCH 30.6 26.0 - 34.0 pg   MCHC 33.2 30.0 - 36.0 g/dL   RDW 14.1 11.5 - 15.5 %   Platelets 351 150 - 400 K/uL   nRBC 0.0 0.0 - 0.2 %   Neutrophils Relative % 85 %   Neutro Abs 7.8 (H) 1.7 - 7.7 K/uL   Lymphocytes Relative 4 %   Lymphs Abs 0.4 (L) 0.7 - 4.0 K/uL  Monocytes Relative 8 %   Monocytes Absolute 0.7 0.1 - 1.0 K/uL   Eosinophils Relative 2 %   Eosinophils Absolute 0.1 0.0 - 0.5 K/uL   Basophils Relative 0 %   Basophils Absolute 0.0 0.0 - 0.1 K/uL   Immature Granulocytes 1 %   Abs Immature Granulocytes 0.06 0.00 - 0.07 K/uL    Comment: Performed at [facility], [address] [place], [place] [address]  Troponin I - ONCE - STAT     Status: Abnormal   Collection Time: 12/11/18 11:34 AM  Result Value Ref Range   Troponin I 0.03 (HH) <0.03 ng/mL    Comment: CRITICAL RESULT CALLED TO, READ BACK BY AND VERIFIED WITH VALERIE CHANDLER 12/11/18 1205 [facility] Performed at [facility], [address] [place], [place] [address]   Brain natriuretic peptide     Status: Abnormal   Collection Time: 12/11/18 11:34 AM  Result Value Ref Range   B Natriuretic Peptide 332.0 (H) 0.0 - 100.0 pg/mL    Comment: Performed at [facility], [address] [place], [place] [address]  Basic metabolic panel     Status:  Abnormal   Collection Time: 12/11/18 11:34 AM  Result Value Ref Range   Sodium 138 135 - 145 mmol/L   Potassium 3.5 3.5 - 5.1 mmol/L   Chloride 102 98 - 111 mmol/L   CO2 25 22 - 32 mmol/L   Glucose, Bld 120 (H) 70 - 99 mg/dL   BUN 19 8 - 23 mg/dL   Creatinine, Ser 0.94 0.61 - 1.24 mg/dL   Calcium 8.6 (L) 8.9 - 10.3 mg/dL   GFR calc non Af Amer >60 >60 mL/min   GFR calc Af Amer >60 >60 mL/min   Anion gap 11 5 - 15    Comment: Performed at [facility], [address] [place], [place] [address]  Lactic acid, plasma     Status: None   Collection Time: 12/11/18 11:34 AM  Result Value Ref Range   Lactic Acid, Venous 1.5 0.5 - 1.9 mmol/L    Comment: Performed at [facility], [address] [place],  [address]   Dg Chest 2 View  Result Date: 12/11/2018 CLINICAL DATA:  Patient with history of pneumonia. Productive cough. EXAM: CHEST - 2 VIEW COMPARISON:  Chest radiograph 12/14/2017 FINDINGS: Monitoring leads overlie the patient. Stable cardiomegaly. Aortic atherosclerosis. Interval development of mid lower lung patchy consolidation. No pleural effusion or pneumothorax. Thoracic spine degenerative changes. IMPRESSION: Right mid and lower lung patchy consolidation concerning for pneumonia. Followup PA and lateral chest X-ray is recommended in 3-4 weeks following trial of antibiotic therapy to ensure resolution and exclude underlying malignancy. Electronically Signed   By: Lovey Newcomer M.D.   On: 12/11/2018 12:18    Pending Labs Unresulted Labs (From admission, onward)    Start     Ordered   12/12/18 9485  Basic metabolic panel  Tomorrow morning,   STAT     12/11/18 1310   12/12/18 0500  CBC  Tomorrow morning,   STAT     12/11/18 1310   12/11/18 1307  Influenza panel by PCR (type A & B)  (Influenza PCR Panel)  Once,   STAT     12/11/18 1306   12/11/18 1119  Blood Culture (routine x 2)  BLOOD CULTURE X 2,   STAT     12/11/18 1119          Vitals/Pain Today's  Vitals  12/11/18 1130 12/11/18 1200 12/11/18 1301 12/11/18 1310  BP: 129/67 (!) 127/54 127/72   Pulse: 71 69 78 85  Resp: 20 10  13   Temp:      TempSrc:      SpO2: 95% 98% 93% 97%  PainSc:        Isolation Precautions Droplet precaution  Medications Medications  methylPREDNISolone sodium succinate (SOLU-MEDROL) 40 mg/mL injection 40 mg (has no administration in time range)  ipratropium-albuterol (DUONEB) 0.5-2.5 (3) MG/3ML nebulizer solution 3 mL (has no administration in time range)  budesonide (PULMICORT) nebulizer solution 0.5 mg (has no administration in time range)  cefTRIAXone (ROCEPHIN) 1 g in sodium chloride 0.9 % 100 mL IVPB (has no administration in time range)  azithromycin (ZITHROMAX) tablet 500 mg (has no administration in time range)    Followed by  azithromycin (ZITHROMAX) tablet 250 mg (has no administration in time range)  acetaminophen (TYLENOL) tablet 650 mg (has no administration in time range)    Or  acetaminophen (TYLENOL) suppository 650 mg (has no administration in time range)  ondansetron (ZOFRAN) tablet 4 mg (has no administration in time range)    Or  ondansetron (ZOFRAN) injection 4 mg (has no administration in time range)  ipratropium-albuterol (DUONEB) 0.5-2.5 (3) MG/3ML nebulizer solution 3 mL (3 mLs Nebulization Given 12/11/18 1139)  ipratropium-albuterol (DUONEB) 0.5-2.5 (3) MG/3ML nebulizer solution 3 mL (3 mLs Nebulization Given 12/11/18 1139)  methylPREDNISolone sodium succinate (SOLU-MEDROL) 125 mg/2 mL injection 60 mg (60 mg Intravenous Given 12/11/18 1141)  chlorpheniramine-HYDROcodone (TUSSIONEX) 10-8 MG/5ML suspension 2.5 mL (2.5 mLs Oral Given 12/11/18 1139)  ipratropium-albuterol (DUONEB) 0.5-2.5 (3) MG/3ML nebulizer solution 3 mL (3 mLs Nebulization Given 12/11/18 1254)    Mobility ambulatory

## 2018-12-12 LAB — CBC
HCT: 40.1 % (ref 39.0–52.0)
HEMOGLOBIN: 13.2 g/dL (ref 13.0–17.0)
MCH: 30.2 pg (ref 26.0–34.0)
MCHC: 32.9 g/dL (ref 30.0–36.0)
MCV: 91.8 fL (ref 80.0–100.0)
Platelets: 344 10*3/uL (ref 150–400)
RBC: 4.37 MIL/uL (ref 4.22–5.81)
RDW: 13.7 % (ref 11.5–15.5)
WBC: 9.6 10*3/uL (ref 4.0–10.5)
nRBC: 0 % (ref 0.0–0.2)

## 2018-12-12 LAB — BASIC METABOLIC PANEL
Anion gap: 11 (ref 5–15)
BUN: 19 mg/dL (ref 8–23)
CO2: 24 mmol/L (ref 22–32)
Calcium: 8.5 mg/dL — ABNORMAL LOW (ref 8.9–10.3)
Chloride: 100 mmol/L (ref 98–111)
Creatinine, Ser: 0.88 mg/dL (ref 0.61–1.24)
GFR calc Af Amer: >60 mL/min (ref >60)
Glucose, Bld: 180 mg/dL — ABNORMAL HIGH (ref 70–99)
Potassium: 3.6 mmol/L (ref 3.5–5.1)
Sodium: 135 mmol/L (ref 135–145)

## 2018-12-12 NOTE — Progress Notes (Signed)
[place] at [place] NAME: David Hardin    MR#:  [id number]  DATE OF BIRTH:  1927/09/10  SUBJECTIVE:   Patient here due to shortness of breath secondary to COPD exacerbation.  Still having some exertional dyspnea with cough.  No fever and no other associated symptoms.  REVIEW OF SYSTEMS:    Review of Systems  Constitutional: Negative for chills and fever.  HENT: Negative for congestion and tinnitus.   Eyes: Negative for blurred vision and double vision.  Respiratory: Positive for cough and shortness of breath. Negative for wheezing.   Cardiovascular: Negative for chest pain, orthopnea and PND.  Gastrointestinal: Negative for abdominal pain, diarrhea, nausea and vomiting.  Genitourinary: Negative for dysuria and hematuria.  Neurological: Negative for dizziness, sensory change and focal weakness.  All other systems reviewed and are negative.   Nutrition: Heart healthy Tolerating Diet: Yes Tolerating PT: Ambulatory  DRUG ALLERGIES:  No Known Allergies  VITALS:  Blood pressure 137/74, pulse 64, temperature 97.7 F (36.5 C), temperature source Oral, resp. rate 20, height 5\' 7"  (1.702 m), weight 96.1 kg, SpO2 92 %.  PHYSICAL EXAMINATION:   Physical Exam  GENERAL:  85 y.o.-year-old patient lying in bed in no acute distress.  EYES: Pupils equal, round, reactive to light and accommodation. No scleral icterus. Extraocular muscles intact.  HEENT: Head atraumatic, normocephalic. Oropharynx and nasopharynx clear.  NECK:  Supple, no jugular venous distention. No thyroid enlargement, no tenderness.  LUNGS: prolonged insp. & Exp. phase, minimal end-exp. Wheezing b/l, No rales, rhonchi. No use of accessory muscles of respiration.  CARDIOVASCULAR: S1, S2 normal. No murmurs, rubs, or gallops.  ABDOMEN: Soft, nontender, nondistended. Bowel sounds present. No organomegaly or mass.  EXTREMITIES: No cyanosis, clubbing or edema b/l.      NEUROLOGIC: Cranial nerves II through XII are intact. No focal Motor or sensory deficits b/l. Globally weak.    PSYCHIATRIC: The patient is alert and oriented x 3.  SKIN: No obvious rash, lesion, or ulcer.    LABORATORY PANEL:   CBC Recent Labs  Lab 12/12/18 0507  WBC 9.6  HGB 13.2  HCT 40.1  PLT 344   ------------------------------------------------------------------------------------------------------------------  Chemistries  Recent Labs  Lab 12/12/18 0507  NA 135  K 3.6  CL 100  CO2 24  GLUCOSE 180*  BUN 19  CREATININE 0.88  CALCIUM 8.5*   ------------------------------------------------------------------------------------------------------------------  Cardiac Enzymes Recent Labs  Lab 12/11/18 1134  TROPONINI 0.03*   ------------------------------------------------------------------------------------------------------------------  RADIOLOGY:  Dg Chest 2 View  Result Date: 12/11/2018 CLINICAL DATA:  Patient with history of pneumonia. Productive cough. EXAM: CHEST - 2 VIEW COMPARISON:  Chest radiograph 12/14/2017 FINDINGS: Monitoring leads overlie the patient. Stable cardiomegaly. Aortic atherosclerosis. Interval development of mid lower lung patchy consolidation. No pleural effusion or pneumothorax. Thoracic spine degenerative changes. IMPRESSION: Right mid and lower lung patchy consolidation concerning for pneumonia. Followup PA and lateral chest X-ray is recommended in 3-4 weeks following trial of antibiotic therapy to ensure resolution and exclude underlying malignancy. Electronically Signed   By: Lovey Newcomer M.D.   On: 12/11/2018 12:18     ASSESSMENT AND PLAN:   85 year old male with past medical history of COPD, coronary artery disease, hypertension, hyperlipidemia, history of chronic atrial fibrillation who presented to the hospital due to shortness of breath and noted to be in COPD exacerbation.  1.  COPD exacerbation-this is a source of patient's  worsening shortness of breath and hypoxemia. - Secondary to  suspected pneumonia and seen on chest x-ray.  We will treat the patient with IV steroids, scheduled duo nebs, Pulmicort nebs.  Continue empiric antibiotics with ceftriaxone, Zithromax.  2.  Pneumonia-suspected to be community-acquired pneumonia.  We will treat the patient with IV ceftriaxone, Zithromax. -Follow cultures.  Patient is clinically afebrile and hemodynamically stable.  3.  Hyperlipidemia-continue atorvastatin.  4.  Essential hypertension-continue Norvasc.  5.  History of atrial fibrillation-rate controlled.  Continue Eliquis.   All the records are reviewed and case discussed with Care Management/Social Worker. Management plans discussed with the patient, family and they are in agreement.  CODE STATUS: Full code  DVT Prophylaxis: Eliquis  TOTAL TIME TAKING CARE OF THIS PATIENT: 30 minutes.   POSSIBLE D/C IN 1-2 DAYS, DEPENDING ON CLINICAL CONDITION.   Henreitta Leber M.D on 12/12/2018 at 1:03 PM  Between 7am to 6pm - Pager - [phone]  After 6pm go to [email or web address] - Proofreader  [facility]  Office  [phone]  CC: Primary care physician; Derinda Late, MD

## 2018-12-12 NOTE — Progress Notes (Signed)
Pt's O2 sats on RA at rest 92%.  Pt ambulated around the nursing station on RA,  O2 sats down to 87%, HR up to 160.  Pt denies chest pain or any distress.  O2 sats up to mid 90's, HR down to 80's quickly after pt back to bed.  Pt remained on RA.  Dr Verdell Carmine made aware.  Per MD pt will stay in hospital today.  No new orders.

## 2018-12-13 MED ORDER — LEVOFLOXACIN 750 MG PO TABS: 750.0000 mg | ORAL_TABLET | Freq: Every day | ORAL | 0 refills | Status: AC

## 2018-12-13 MED ORDER — IPRATROPIUM-ALBUTEROL 0.5-2.5 (3) MG/3ML IN SOLN: 3.0000 mL | Freq: Four times a day (QID) | RESPIRATORY_TRACT | 0 refills | Status: AC | PRN

## 2018-12-13 MED ORDER — DM-GUAIFENESIN ER 30-600 MG PO TB12: 1.0000 | ORAL_TABLET | Freq: Two times a day (BID) | ORAL | 0 refills | Status: AC

## 2018-12-13 MED ORDER — PREDNISONE 10 MG PO TABS: ORAL_TABLET | ORAL | 0 refills | Status: DC

## 2018-12-13 NOTE — Progress Notes (Signed)
Reviewed AVS with pt and wife. Pt verbalized understanding. Printed RX with AVS.

## 2018-12-13 NOTE — Progress Notes (Signed)
SATURATION QUALIFICATIONS: (This note is used to comply with regulatory documentation for home oxygen)  Patient Saturations on Room Air at Rest = 93%  Patient Saturations on Room Air while Ambulating = 89%  Patient Saturations on 0 Liters of oxygen while Ambulating = 89%  Please briefly explain why patient needs home oxygen:

## 2018-12-13 NOTE — Care Management Note (Signed)
Case Management Note  Patient Details  Name: YEISON SIPPEL MRN: [id number] Date of Birth: Sep 01, 1927  Subjective/Objective:   Admitted to [facility] with the diagnosis of COPD. Lives with wife in the home. Sees Dr. Loney Hering as primary care physician.                 Action/Plan: Will need home nebulizer per Dr. Verdell Carmine. Sunday Corn, [place] representative updated.   Expected Discharge Date:  12/13/18               Expected Discharge Plan:     In-House Referral:   yes  Discharge planning Services   yes  Post Acute Care Choice:   yes Choice offered to:   patient  DME Arranged:   yes DME Agency:   [facility]  HH Arranged:    [employer] Agency:     Status of Service:     If discussed at [place] of Stay Meetings, dates discussed:    Additional Comments:  Shelbie Ammons, RN MSN CCM Care Management [phone] 12/13/2018, 12:31 PM

## 2018-12-13 NOTE — Care Management Important Message (Signed)
Copy of signed Medicare IM left with patient in room. 

## 2018-12-13 NOTE — Discharge Summary (Signed)
[place] at [place] NAME: David Hardin    MR#:  [id number]  DATE OF BIRTH:  05-26-1927  DATE OF ADMISSION:  12/11/2018 ADMITTING PHYSICIAN: Loletha Grayer, MD  DATE OF DISCHARGE: 12/13/2018  PRIMARY CARE PHYSICIAN: Derinda Late, MD    ADMISSION DIAGNOSIS:  COPD exacerbation ([place]) [J44.1] Community acquired pneumonia of right lung, unspecified part of lung [J18.9]  DISCHARGE DIAGNOSIS:  Active Problems:   COPD exacerbation ([place])   SECONDARY DIAGNOSIS:   Past Medical History:  Diagnosis Date  . Atrial fibrillation ([place])   . COPD (chronic obstructive pulmonary disease) ([place])   . Coronary artery disease   . Hyperlipidemia   . Hypertension     HOSPITAL COURSE:   85 year old male with past medical history of COPD, coronary artery disease, hypertension, hyperlipidemia, history of chronic atrial fibrillation who presented to the hospital due to shortness of breath and noted to be in COPD exacerbation.  1.  COPD exacerbation-this was the source of patient's worsening shortness of breath and hypoxemia. - Secondary to suspected pneumonia and seen on chest x-ray on admission.   - pt. Was treated with IV steroids, scheduled duonebs, pulmicort nebs.  He was also given empiric abx with IV Ceftriaxone, Zithromax.   -Patient has improved, he has less wheezing and bronchospasm.  He was ambulated and is not hypoxic and does not need home oxygen. - Patient will be discharged on a prednisone taper, notes of his inhalers as stated below, oral Levaquin and he was also given a nebulizer machine with duo nebs as needed.  2.  Pneumonia-suspected to be community-acquired pneumonia.    Patient was treated with IV ceftriaxone, Zithromax in the hospital.  He is now being discharged on oral Levaquin for additional 5 days.  He is afebrile and hemodynamically stable.  His white cell count is normal.  3.  Hyperlipidemia- pt. Will continue atorvastatin.  4.   Essential hypertension- pt. Will continue Norvasc.  5.  History of atrial fibrillation- this remained rate controlled. He will Continue Eliquis  DISCHARGE CONDITIONS:   Stable.   CONSULTS OBTAINED:    DRUG ALLERGIES:  No Known Allergies  DISCHARGE MEDICATIONS:   Allergies as of 12/13/2018   No Known Allergies     Medication List    TAKE these medications   albuterol 108 (90 Base) MCG/ACT inhaler Commonly known as:  PROVENTIL HFA;VENTOLIN HFA Inhale 1 puff into the lungs every 6 (six) hours as needed.   amLODipine 5 MG tablet Commonly known as:  NORVASC Take 5 mg by mouth daily.   aspirin 81 MG chewable tablet Chew 81 mg by mouth daily.   atorvastatin 20 MG tablet Commonly known as:  LIPITOR Take 20 mg by mouth daily.   dextromethorphan-guaiFENesin 30-600 MG 12hr tablet Commonly known as:  MUCINEX DM Take 1 tablet by mouth 2 (two) times daily for 7 days.   ELIQUIS 5 MG Tabs tablet Generic drug:  apixaban Take 5 mg by mouth every 12 (twelve) hours.   fluticasone 50 MCG/ACT nasal spray Commonly known as:  FLONASE Place 1 spray into the nose 2 (two) times daily.   fluticasone furoate-vilanterol 100-25 MCG/INH Aepb Commonly known as:  BREO ELLIPTA Inhale 1 puff into the lungs daily.   furosemide 20 MG tablet Commonly known as:  LASIX Take 20 mg by mouth daily.   ipratropium-albuterol 0.5-2.5 (3) MG/3ML Soln Commonly known as:  DUONEB Take 3 mLs by nebulization every 6 (six) hours as needed (shortness  of breath, wheezing.).   levofloxacin 750 MG tablet Commonly known as:  LEVAQUIN Take 1 tablet (750 mg total) by mouth daily for 5 days.   montelukast 10 MG tablet Commonly known as:  SINGULAIR Take 10 mg by mouth at bedtime.   predniSONE 10 MG tablet Commonly known as:  DELTASONE Label  & dispense according to the schedule below. 5 Pills PO for 1 day then, 4 Pills PO for 1 day, 3 Pills PO for 1 day, 2 Pills PO for 1 day, 1 Pill PO for 1 days then  STOP. What changed:    medication strength  how much to take  how to take this  when to take this  additional instructions   PRESERVISION AREDS Caps Take 1 capsule by mouth 2 (two) times daily.   ROBAXIN 500 MG tablet Generic drug:  methocarbamol Take 500 mg by mouth 2 (two) times daily.   sildenafil 20 MG tablet Commonly known as:  REVATIO TAKE 3 TABLETS BY MOUTH ONCE DAILY PRIORTO SEX AS NEEDED            Durable Medical Equipment  (From admission, onward)         Start     Ordered   12/13/18 1225  For home use only DME Nebulizer machine  Once    Question:  Patient needs a nebulizer to treat with the following condition  Answer:  COPD (chronic obstructive pulmonary disease) ([place])   12/13/18 1224            DISCHARGE INSTRUCTIONS:   DIET:  Cardiac diet and Diabetic diet  DISCHARGE CONDITION:  Stable  ACTIVITY:  Activity as tolerated  OXYGEN:  Home Oxygen: No.   Oxygen Delivery: room air  DISCHARGE LOCATION:  home   If you experience worsening of your admission symptoms, develop shortness of breath, life threatening emergency, suicidal or homicidal thoughts you must seek medical attention immediately by calling 911 or calling your MD immediately  if symptoms less severe.  You Must read complete instructions/literature along with all the possible adverse reactions/side effects for all the Medicines you take and that have been prescribed to you. Take any new Medicines after you have completely understood and accpet all the possible adverse reactions/side effects.   Please note  You were cared for by a hospitalist during your hospital stay. If you have any questions about your discharge medications or the care you received while you were in the hospital after you are discharged, you can call the unit and asked to speak with the hospitalist on call if the hospitalist that took care of you is not available. Once you are discharged, your primary care  physician will handle any further medical issues. Please note that NO REFILLS for any discharge medications will be authorized once you are discharged, as it is imperative that you return to your primary care physician (or establish a relationship with a primary care physician if you do not have one) for your aftercare needs so that they can reassess your need for medications and monitor your lab values.     Today   Patient has some upper airway rattling but otherwise feels better since admission.  No fever, hemodynamically stable.  Will d/c home today.   VITAL SIGNS:  Blood pressure (!) 152/91, pulse (!) 54, temperature 98.4 F (36.9 C), temperature source Oral, resp. rate 13, height 5\' 7"  (1.702 m), weight 96.1 kg, SpO2 92 %.  I/O:    Intake/Output Summary (Last 24 hours)  at 12/13/2018 1500 Last data filed at 12/12/2018 1543 Gross per 24 hour  Intake 112.09 ml  Output -  Net 112.09 ml    PHYSICAL EXAMINATION:   GENERAL:  85 y.o.-year-old patient lying in bed in no acute distress.  EYES: Pupils equal, round, reactive to light and accommodation. No scleral icterus. Extraocular muscles intact.  HEENT: Head atraumatic, normocephalic. Oropharynx and nasopharynx clear.  NECK:  Supple, no jugular venous distention. No thyroid enlargement, no tenderness.  LUNGS: prolonged insp. & Exp. phase, upper airway rhonchi, no wheezing, rales. No use of accessory muscles of respiration.  CARDIOVASCULAR: S1, S2 normal. No murmurs, rubs, or gallops.  ABDOMEN: Soft, nontender, nondistended. Bowel sounds present. No organomegaly or mass.  EXTREMITIES: No cyanosis, clubbing or edema b/l.    NEUROLOGIC: Cranial nerves II through XII are intact. No focal Motor or sensory deficits b/l. Globally weak.    PSYCHIATRIC: The patient is alert and oriented x 3.  SKIN: No obvious rash, lesion, or ulcer.   DATA REVIEW:   CBC Recent Labs  Lab 12/12/18 0507  WBC 9.6  HGB 13.2  HCT 40.1  PLT 344     Chemistries  Recent Labs  Lab 12/12/18 0507  NA 135  K 3.6  CL 100  CO2 24  GLUCOSE 180*  BUN 19  CREATININE 0.88  CALCIUM 8.5*    Cardiac Enzymes Recent Labs  Lab 12/11/18 1134  TROPONINI 0.03*    Microbiology Results  Results for orders placed or performed during the hospital encounter of 12/11/18  Blood Culture (routine x 2)     Status: None (Preliminary result)   Collection Time: 12/11/18 11:34 AM  Result Value Ref Range Status   Specimen Description BLOOD LEFT AC  Final   Special Requests   Final    BOTTLES DRAWN AEROBIC AND ANAEROBIC Blood Culture adequate volume   Culture   Final    NO GROWTH < 24 HOURS Performed at [facility], [address] [place], [place] [address]    Report Status PENDING  Incomplete  Blood Culture (routine x 2)     Status: None (Preliminary result)   Collection Time: 12/11/18 11:34 AM  Result Value Ref Range Status   Specimen Description BLOOD LEFT HAND  Final   Special Requests   Final    BOTTLES DRAWN AEROBIC AND ANAEROBIC Blood Culture adequate volume   Culture   Final    NO GROWTH < 24 HOURS Performed at [facility], [address] [place], [place] [address]    Report Status PENDING  Incomplete    RADIOLOGY:  No results found.    Management plans discussed with the patient, family and they are in agreement.  CODE STATUS:     Code Status Orders  (From admission, onward)         Start     Ordered   12/11/18 1310  Full code  Continuous     12/11/18 1310        TOTAL TIME TAKING CARE OF THIS PATIENT: 40 minutes.    Henreitta Leber M.D on 12/13/2018 at 3:00 PM  Between 7am to 6pm - Pager - [phone]  After 6pm go to [email or web address] - Proofreader  [facility]  Office  [phone]  CC: Primary care physician; Derinda Late, MD

## 2018-12-16 ENCOUNTER — Telehealth: Payer: Self-pay

## 2018-12-16 LAB — CULTURE, BLOOD (ROUTINE X 2)
Culture: NO GROWTH
Culture: NO GROWTH
SPECIAL REQUESTS: ADEQUATE
Special Requests: ADEQUATE

## 2018-12-16 NOTE — Telephone Encounter (Signed)
EMMI Follow-up: Noted on the report that the patient didn't have a scheduled follow-up appointment.  I called to talk with David Hardin but asked to speak with his wife, David Hardin as he is hard of hearing.  [name] he was doing better and she had made his follow-up appointment for tomorrow (12/17/18).  I let her know there would be a second automated call with a different series of questions and to let us know if he has any concerns at that time.

## 2019-03-12 IMAGING — CR DG CHEST 2V
2 series · 2 of 2 positions shown · non-contrast
Comparison: 02/05/2014 chest radiograph

CLINICAL DATA: 89 y/o  M; shortness of breath and history of COPD.

EXAM:
CHEST  2 VIEW

[chest pa]
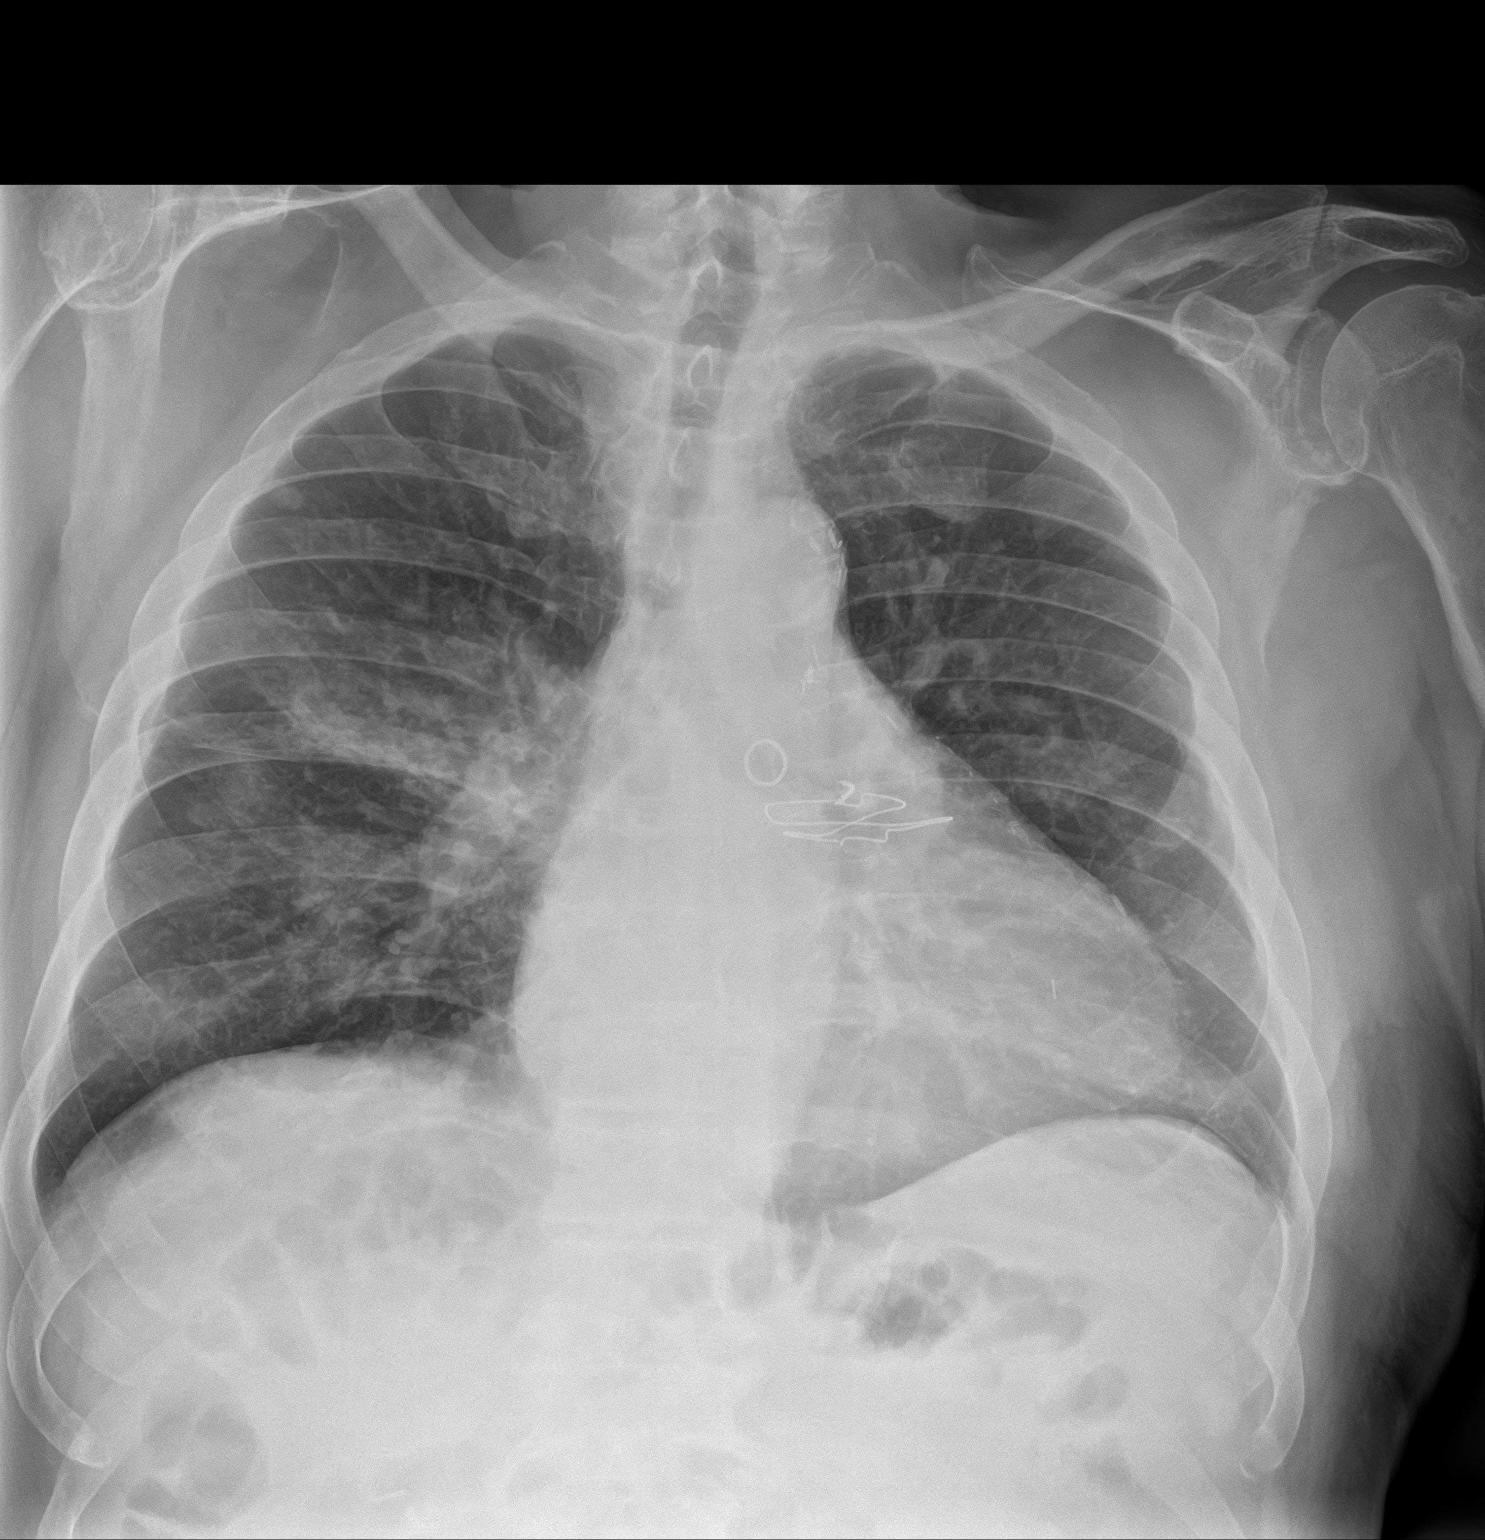

[chest lat]
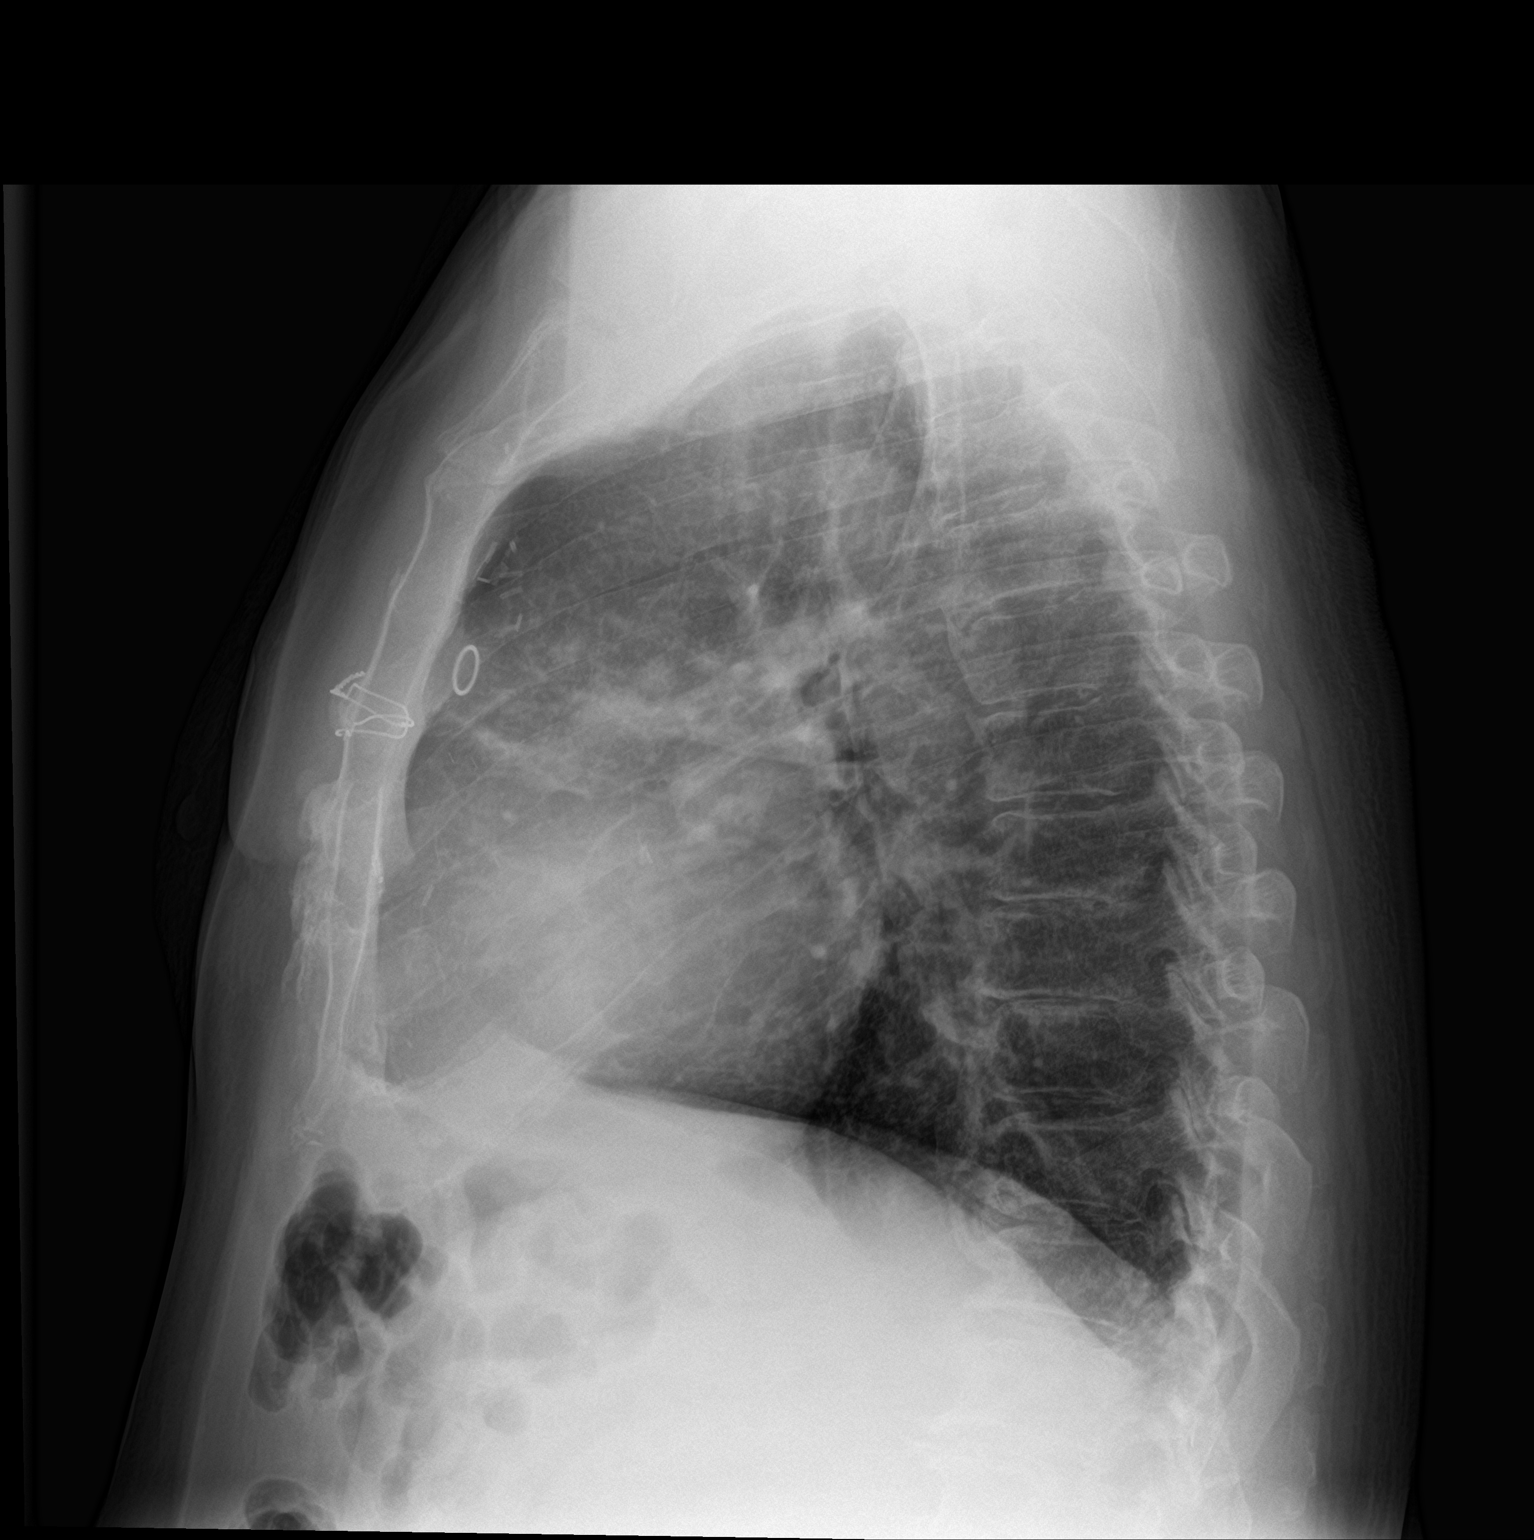

[2 of 2 positions shown; findings below may reference images not displayed]

FINDINGS: Stable moderate cardiomegaly. Status post CABG. Aortic
atherosclerosis with calcification. Right anterior upper lobe
consolidation. Stable 10 mm nodule projecting over the right third
anterior rib is stable. No pleural effusion or pneumothorax. No
acute osseous abnormality is evident. Right upper quadrant
cholecystectomy clips.
IMPRESSION: Right anterior upper lobe consolidation probably representing
pneumonia. Stable cardiomegaly.

By: Napkas Filimonovas M.D.

## 2019-06-26 ENCOUNTER — Other Ambulatory Visit: Payer: Self-pay | Admitting: Specialist

## 2019-06-26 DIAGNOSIS — R9389 Abnormal findings on diagnostic imaging of other specified body structures: Secondary | ICD-10-CM

## 2019-06-26 DIAGNOSIS — R911 Solitary pulmonary nodule: Secondary | ICD-10-CM

## 2019-07-03 ENCOUNTER — Ambulatory Visit
Admission: RE | Admit: 2019-07-03 | Discharge: 2019-07-03 | Disposition: A | Discharge status: Home / Self Care | Payer: Medicare HMO | Source: Ambulatory Visit | Attending: Specialist | Admitting: Specialist

## 2019-07-03 ENCOUNTER — Other Ambulatory Visit: Payer: Self-pay

## 2019-07-03 DIAGNOSIS — R911 Solitary pulmonary nodule: Secondary | ICD-10-CM | POA: Diagnosis present

## 2019-07-03 DIAGNOSIS — R9389 Abnormal findings on diagnostic imaging of other specified body structures: Secondary | ICD-10-CM | POA: Insufficient documentation

## 2019-07-03 LAB — POCT I-STAT CREATININE: Creatinine, Ser: 0.9 mg/dL (ref 0.61–1.24)

## 2019-07-03 MED ORDER — IOHEXOL 300 MG/ML  SOLN
75.0000 mL | Freq: Once | INTRAMUSCULAR | Status: AC | PRN
  Administered 2019-07-03: 75 mL via INTRAVENOUS

## 2019-07-11 ENCOUNTER — Other Ambulatory Visit: Payer: Self-pay | Admitting: Specialist

## 2019-07-11 DIAGNOSIS — R911 Solitary pulmonary nodule: Secondary | ICD-10-CM

## 2019-09-29 DIAGNOSIS — K922 Gastrointestinal hemorrhage, unspecified: Secondary | ICD-10-CM

## 2019-09-29 HISTORY — DX: Gastrointestinal hemorrhage, unspecified: K92.2

## 2019-09-30 ENCOUNTER — Other Ambulatory Visit
Admission: RE | Admit: 2019-09-30 | Discharge: 2019-09-30 | Disposition: A | Discharge status: Home / Self Care | Payer: Medicare HMO | Source: Ambulatory Visit | Attending: Internal Medicine | Admitting: Internal Medicine

## 2019-09-30 ENCOUNTER — Other Ambulatory Visit: Payer: Self-pay

## 2019-09-30 DIAGNOSIS — Z20828 Contact with and (suspected) exposure to other viral communicable diseases: Secondary | ICD-10-CM | POA: Insufficient documentation

## 2019-09-30 DIAGNOSIS — Z01812 Encounter for preprocedural laboratory examination: Secondary | ICD-10-CM | POA: Insufficient documentation

## 2019-09-30 LAB — SARS CORONAVIRUS 2 (TAT 6-24 HRS): SARS Coronavirus 2: NEGATIVE

## 2019-10-02 ENCOUNTER — Ambulatory Visit
Admission: RE | Admit: 2019-10-02 | Discharge: 2019-10-02 | Disposition: A | Discharge status: Home / Self Care | Payer: Medicare HMO | Attending: Internal Medicine | Admitting: Internal Medicine

## 2019-10-02 ENCOUNTER — Ambulatory Visit: Payer: Medicare HMO | Admitting: Anesthesiology

## 2019-10-02 ENCOUNTER — Encounter
Admission: RE | Disposition: A | Discharge status: Home / Self Care | Payer: Self-pay | Source: Home / Self Care | Attending: Internal Medicine

## 2019-10-02 ENCOUNTER — Encounter: Payer: Self-pay | Admitting: *Deleted

## 2019-10-02 DIAGNOSIS — Z7901 Long term (current) use of anticoagulants: Secondary | ICD-10-CM | POA: Diagnosis not present

## 2019-10-02 DIAGNOSIS — Z87891 Personal history of nicotine dependence: Secondary | ICD-10-CM | POA: Insufficient documentation

## 2019-10-02 DIAGNOSIS — D120 Benign neoplasm of cecum: Secondary | ICD-10-CM | POA: Diagnosis not present

## 2019-10-02 DIAGNOSIS — K640 First degree hemorrhoids: Secondary | ICD-10-CM | POA: Insufficient documentation

## 2019-10-02 DIAGNOSIS — E785 Hyperlipidemia, unspecified: Secondary | ICD-10-CM | POA: Insufficient documentation

## 2019-10-02 DIAGNOSIS — Z79899 Other long term (current) drug therapy: Secondary | ICD-10-CM | POA: Diagnosis not present

## 2019-10-02 DIAGNOSIS — Z7982 Long term (current) use of aspirin: Secondary | ICD-10-CM | POA: Diagnosis not present

## 2019-10-02 DIAGNOSIS — I4891 Unspecified atrial fibrillation: Secondary | ICD-10-CM | POA: Diagnosis not present

## 2019-10-02 DIAGNOSIS — K921 Melena: Secondary | ICD-10-CM | POA: Insufficient documentation

## 2019-10-02 DIAGNOSIS — D500 Iron deficiency anemia secondary to blood loss (chronic): Secondary | ICD-10-CM | POA: Insufficient documentation

## 2019-10-02 DIAGNOSIS — I2510 Atherosclerotic heart disease of native coronary artery without angina pectoris: Secondary | ICD-10-CM | POA: Insufficient documentation

## 2019-10-02 DIAGNOSIS — I10 Essential (primary) hypertension: Secondary | ICD-10-CM | POA: Diagnosis not present

## 2019-10-02 DIAGNOSIS — J449 Chronic obstructive pulmonary disease, unspecified: Secondary | ICD-10-CM | POA: Insufficient documentation

## 2019-10-02 DIAGNOSIS — Z7951 Long term (current) use of inhaled steroids: Secondary | ICD-10-CM | POA: Insufficient documentation

## 2019-10-02 DIAGNOSIS — K5730 Diverticulosis of large intestine without perforation or abscess without bleeding: Secondary | ICD-10-CM | POA: Diagnosis not present

## 2019-10-02 HISTORY — PX: COLONOSCOPY WITH PROPOFOL: SHX5780

## 2019-10-02 SURGERY — COLONOSCOPY WITH PROPOFOL
Anesthesia: General

## 2019-10-02 MED ORDER — PROPOFOL 500 MG/50ML IV EMUL
INTRAVENOUS | Status: DC | PRN
  Administered 2019-10-02: 125 ug/kg/min via INTRAVENOUS

## 2019-10-02 MED ORDER — PROPOFOL 10 MG/ML IV BOLUS
INTRAVENOUS | Status: AC
  Filled 2019-10-02: qty 20

## 2019-10-02 MED ORDER — LIDOCAINE HCL (PF) 2 % IJ SOLN
INTRAMUSCULAR | Status: AC
  Filled 2019-10-02: qty 10

## 2019-10-02 MED ORDER — LIDOCAINE HCL (CARDIAC) PF 100 MG/5ML IV SOSY
PREFILLED_SYRINGE | INTRAVENOUS | Status: DC | PRN
  Administered 2019-10-02: 100 mg via INTRAVENOUS

## 2019-10-02 MED ORDER — SODIUM CHLORIDE 0.9 % IV SOLN
INTRAVENOUS | Status: DC
  Administered 2019-10-02: 10:00:00 1000 mL via INTRAVENOUS

## 2019-10-02 MED ORDER — PROPOFOL 10 MG/ML IV BOLUS
INTRAVENOUS | Status: DC | PRN
  Administered 2019-10-02: 80 mg via INTRAVENOUS

## 2019-10-02 NOTE — Interval H&P Note (Signed)
History and Physical Interval Note:  10/02/2019 10:21 AM  David Hardin  has presented today for surgery, with the diagnosis of RECTAL BLEED.  The various methods of treatment have been discussed with the patient and family. After consideration of risks, benefits and other options for treatment, the patient has consented to  Procedure(s): COLONOSCOPY WITH PROPOFOL (N/A) as a surgical intervention.  The patient's history has been reviewed, patient examined, no change in status, stable for surgery.  I have reviewed the patient's chart and labs.  Questions were answered to the patient's satisfaction.     [place], [place]

## 2019-10-02 NOTE — Anesthesia Postprocedure Evaluation (Signed)
Anesthesia Post Note  Patient: David Hardin  Procedure(s) Performed: COLONOSCOPY WITH PROPOFOL (N/A )  Patient location during evaluation: Endoscopy Anesthesia Type: General Level of consciousness: awake and alert Pain management: pain level controlled Vital Signs Assessment: post-procedure vital signs reviewed and stable Respiratory status: spontaneous breathing, nonlabored ventilation, respiratory function stable and patient connected to nasal cannula oxygen Cardiovascular status: blood pressure returned to baseline and stable Postop Assessment: no apparent nausea or vomiting Anesthetic complications: no     Last Vitals:  Vitals:   10/02/19 1201 10/02/19 1212  BP: 138/65   Pulse: (!) 56   Resp: 15 15  Temp:    SpO2: 100%     Last Pain:  Vitals:   10/02/19 1212  TempSrc:   PainSc: 0-No pain                 Martha Clan

## 2019-10-02 NOTE — Op Note (Signed)
[facility] Patient Name: David Hardin Procedure Date: 10/02/2019 11:05 AM MRN: [id number] Account #: [id number] Date of Birth: 26-Dec-1926 Admit Type: Outpatient Age: 85 Room: [facility] ENDO ROOM 1 Gender: Male Note Status: Finalized Procedure:            Colonoscopy Indications:          Hematochezia, Iron deficiency anemia secondary to                        chronic blood loss Providers:            Benay Pike. Alice Reichert MD, MD Referring MD:         Caprice Renshaw MD (Referring MD) Medicines:            Propofol per Anesthesia Complications:        No immediate complications. Procedure:            Pre-Anesthesia Assessment:                       - The risks and benefits of the procedure and the                        sedation options and risks were discussed with the                        patient. All questions were answered and informed                        consent was obtained.                       - Patient identification and proposed procedure were                        verified prior to the procedure by the nurse. The                        procedure was verified in the procedure room.                       - ASA Grade Assessment: III - A patient with severe                        systemic disease.                       - After reviewing the risks and benefits, the patient                        was deemed in satisfactory condition to undergo the                        procedure.                       After obtaining informed consent, the colonoscope was                        passed under direct vision. Throughout the procedure,  the patient's blood pressure, pulse, and oxygen                        saturations were monitored continuously. The                        Colonoscope was introduced through the anus and                        advanced to the the cecum, identified by appendiceal   orifice and ileocecal valve. The colonoscopy was                        performed without difficulty. The patient tolerated the                        procedure well. The quality of the bowel preparation                        was good. The ileocecal valve, appendiceal orifice, and                        rectum were photographed. Findings:      The perianal and digital rectal examinations were normal. Pertinent       negatives include normal sphincter tone and no palpable rectal lesions.      Multiple small and large-mouthed diverticula were found in the sigmoid       colon.      There is no endoscopic evidence of bleeding, erythema, inflammation,       mass or ulcerations in the entire colon.      Three sessile polyps were found in the cecum. The polyps were 3 to 6 mm       in size. These polyps were removed with a cold snare. Resection and       retrieval were complete. To prevent bleeding after the polypectomy, one       hemostatic clip was successfully placed (MR conditional). There was no       bleeding during, or at the end, of the procedure.      Non-bleeding internal hemorrhoids were found during retroflexion. The       hemorrhoids were Grade I (internal hemorrhoids that do not prolapse).      The exam was otherwise without abnormality. Impression:           - Diverticulosis in the sigmoid colon.                       - Three 3 to 6 mm polyps in the cecum, removed with a                        cold snare. Resected and retrieved. Clip (MR                        conditional) was placed.                       - Non-bleeding internal hemorrhoids.                       - The examination was otherwise normal. Recommendation:       - Patient has  a contact number available for                        emergencies. The signs and symptoms of potential                        delayed complications were discussed with the patient.                        Return to normal activities tomorrow.  Written discharge                        instructions were provided to the patient.                       - Resume previous diet.                       - Continue present medications.                       - Await pathology results.                       - No repeat colonoscopy due to current age (39 years or                        older).                       - Further recommendations after path review.                       - Check hemoglobin monthly.                       - The findings and recommendations were discussed with                        the patient. Procedure Code(s):    --- Professional ---                       [phone], Colonoscopy, flexible; with removal of tumor(s),                        polyp(s), or other lesion(s) by snare technique Diagnosis Code(s):    --- Professional ---                       K57.30, Diverticulosis of large intestine without                        perforation or abscess without bleeding                       D50.0, Iron deficiency anemia secondary to blood loss                        (chronic)                       K92.1, Melena (includes Hematochezia)  K63.5, Polyp of colon                       K64.0, First degree hemorrhoids CPT copyright 2019 American Medical Association. All rights reserved. The codes documented in this report are preliminary and upon coder review may  be revised to meet current compliance requirements. Efrain Sella MD, MD 10/02/2019 11:47:30 AM This report has been signed electronically. Number of Addenda: 0 Note Initiated On: 10/02/2019 11:05 AM Scope Withdrawal Time: 0 hours 13 minutes 45 seconds  Total Procedure Duration: 0 hours 17 minutes 40 seconds  Estimated Blood Loss: Estimated blood loss was minimal.      [facility]

## 2019-10-02 NOTE — Anesthesia Post-op Follow-up Note (Signed)
Anesthesia QCDR form completed.        

## 2019-10-02 NOTE — H&P (Signed)
Outpatient short stay form Pre-procedure 10/02/2019 10:19 AM David K. Alice Reichert, M.D.  Primary Physician: Derinda Late, M.D.  Reason for visit:  Rectal bleeding, anemia  History of present illness:  Patient presents for intermittent rectal bleeding and anemia. Denies change in bowel habits, abdominal pain or weight loss. He takes Eliquis at home but has been cleared by his cardiologist to hold the medication for 3 days in preparation for colonoscopy.     Current Facility-Administered Medications:  .  0.9 %  sodium chloride infusion, , Intravenous, Continuous, [place], Benay Pike, MD  Medications Prior to Admission  Medication Sig Dispense Refill Last Dose  . albuterol (PROVENTIL HFA;VENTOLIN HFA) 108 (90 Base) MCG/ACT inhaler Inhale 1 puff into the lungs every 6 (six) hours as needed.   10/01/2019 at Unknown time  . amLODipine (NORVASC) 5 MG tablet Take 5 mg by mouth daily.   10/02/2019 at 0600  . aspirin 81 MG chewable tablet Chew 81 mg by mouth daily.   Past Week at Unknown time  . atorvastatin (LIPITOR) 20 MG tablet Take 20 mg by mouth daily.   10/01/2019 at Unknown time  . fluticasone furoate-vilanterol (BREO ELLIPTA) 100-25 MCG/INH AEPB Inhale 1 puff into the lungs daily.   10/01/2019 at Unknown time  . furosemide (LASIX) 20 MG tablet Take 20 mg by mouth daily.   10/01/2019 at Unknown time  . methocarbamol (ROBAXIN) 500 MG tablet Take 500 mg by mouth 2 (two) times daily.   10/01/2019 at Unknown time  . montelukast (SINGULAIR) 10 MG tablet Take 10 mg by mouth at bedtime.   10/01/2019 at Unknown time  . Multiple Vitamins-Minerals (PRESERVISION AREDS) CAPS Take 1 capsule by mouth 2 (two) times daily.   Past Week at Unknown time  . apixaban (ELIQUIS) 5 MG TABS tablet Take 5 mg by mouth every 12 (twelve) hours.     . fluticasone (FLONASE) 50 MCG/ACT nasal spray Place 1 spray into the nose 2 (two) times daily.     Marland Kitchen ipratropium-albuterol (DUONEB) 0.5-2.5 (3) MG/3ML SOLN Take 3 mLs by  nebulization every 6 (six) hours as needed (shortness of breath, wheezing.). 360 mL 0   . predniSONE (DELTASONE) 10 MG tablet Label  & dispense according to the schedule below. 5 Pills PO for 1 day then, 4 Pills PO for 1 day, 3 Pills PO for 1 day, 2 Pills PO for 1 day, 1 Pill PO for 1 days then STOP. (Patient not taking: Reported on 10/02/2019) 15 tablet 0 Completed Course at Unknown time  . sildenafil (REVATIO) 20 MG tablet TAKE 3 TABLETS BY MOUTH ONCE DAILY PRIORTO SEX AS NEEDED        No Known Allergies   Past Medical History:  Diagnosis Date  . Atrial fibrillation ([place])   . COPD (chronic obstructive pulmonary disease) ([place])   . Coronary artery disease   . Hyperlipidemia   . Hypertension     Review of systems:  Otherwise negative.    Physical Exam  Gen: Alert, oriented. Appears stated age.  HEENT: [place]/AT. PERRLA. Lungs: CTA, no wheezes. CV: RR nl S1, S2. Abd: soft, benign, no masses. BS+ Ext: No edema. Pulses 2+    Planned procedures: Proceed with colonoscopy. The patient understands the nature of the planned procedure, indications, risks, alternatives and potential complications including but not limited to bleeding, infection, perforation, damage to internal organs and possible oversedation/side effects from anesthesia. The patient agrees and gives consent to proceed.  Please refer to procedure notes for findings, recommendations  and patient disposition/instructions.     David K. Alice Reichert, M.D. Gastroenterology 10/02/2019  10:19 AM

## 2019-10-02 NOTE — Anesthesia Preprocedure Evaluation (Signed)
Anesthesia Evaluation  Patient identified by MRN, date of birth, ID band Patient awake    Reviewed: Allergy & Precautions, H&P , NPO status , Patient's Chart, lab work & pertinent test results, reviewed documented beta blocker date and time   History of Anesthesia Complications Negative for: history of anesthetic complications  Airway Mallampati: II  TM Distance: >3 FB Neck ROM: full    Dental  (+) Dental Advidsory Given, Caps   Pulmonary neg shortness of breath, neg sleep apnea, COPD, neg recent URI, former smoker,    Pulmonary exam normal        Cardiovascular Exercise Tolerance: Good hypertension, (-) angina+ CAD, + Past MI and + CABG  (-) Cardiac Stents Normal cardiovascular exam+ dysrhythmias Atrial Fibrillation (-) Valvular Problems/Murmurs     Neuro/Psych negative neurological ROS  negative psych ROS   GI/Hepatic negative GI ROS, Neg liver ROS,   Endo/Other  negative endocrine ROS  Renal/GU negative Renal ROS  negative genitourinary   Musculoskeletal   Abdominal   Peds  Hematology negative hematology ROS (+)   Anesthesia Other Findings Past Medical History: No date: Atrial fibrillation (HCC) No date: COPD (chronic obstructive pulmonary disease) (HCC) No date: Coronary artery disease No date: Hyperlipidemia No date: Hypertension   Reproductive/Obstetrics negative OB ROS                             Anesthesia Physical Anesthesia Plan  ASA: III  Anesthesia Plan: General   Post-op Pain Management:    Induction: Intravenous  PONV Risk Score and Plan: 2 and Propofol infusion and TIVA  Airway Management Planned: Natural Airway and Nasal Cannula  Additional Equipment:   Intra-op Plan:   Post-operative Plan:   Informed Consent: I have reviewed the patients History and Physical, chart, labs and discussed the procedure including the risks, benefits and alternatives for the  proposed anesthesia with the patient or authorized representative who has indicated his/her understanding and acceptance.     Dental Advisory Given  Plan Discussed with: Anesthesiologist, CRNA and Surgeon  Anesthesia Plan Comments:         Anesthesia Quick Evaluation

## 2019-10-02 NOTE — Transfer of Care (Signed)
Immediate Anesthesia Transfer of Care Note  Patient: David Hardin  Procedure(s) Performed: COLONOSCOPY WITH PROPOFOL (N/A )  Patient Location: Endoscopy Unit  Anesthesia Type:General  Level of Consciousness: sedated  Airway & Oxygen Therapy: Patient Spontanous Breathing and Patient connected to nasal cannula oxygen  Post-op Assessment: Report given to RN and Post -op Vital signs reviewed and stable  Post vital signs: Reviewed and stable  Last Vitals:  Vitals Value Taken Time  BP 94/59 10/02/19 1142  Temp    Pulse 54 10/02/19 1142  Resp 15 10/02/19 1142  SpO2 100 % 10/02/19 1142  Vitals shown include unvalidated device data.  Last Pain:  Vitals:   10/02/19 1002  TempSrc: Oral  PainSc: 0-No pain         Complications: No apparent anesthesia complications

## 2019-10-03 LAB — SURGICAL PATHOLOGY

## 2020-03-29 ENCOUNTER — Other Ambulatory Visit: Payer: Self-pay

## 2020-03-29 ENCOUNTER — Other Ambulatory Visit: Payer: Self-pay | Admitting: Cardiology

## 2020-03-29 ENCOUNTER — Ambulatory Visit
Admission: RE | Admit: 2020-03-29 | Discharge: 2020-03-29 | Disposition: A | Discharge status: Home / Self Care | Payer: Medicare HMO | Source: Ambulatory Visit | Attending: Cardiology | Admitting: Cardiology

## 2020-03-29 DIAGNOSIS — M7989 Other specified soft tissue disorders: Secondary | ICD-10-CM | POA: Diagnosis present

## 2020-03-29 DIAGNOSIS — I824Y9 Acute embolism and thrombosis of unspecified deep veins of unspecified proximal lower extremity: Secondary | ICD-10-CM | POA: Diagnosis present

## 2020-06-10 ENCOUNTER — Ambulatory Visit: Payer: Medicare HMO

## 2020-06-15 ENCOUNTER — Ambulatory Visit: Payer: Medicare HMO

## 2020-06-16 ENCOUNTER — Other Ambulatory Visit: Payer: Self-pay

## 2020-06-16 ENCOUNTER — Ambulatory Visit
Admission: RE | Admit: 2020-06-16 | Discharge: 2020-06-16 | Disposition: A | Discharge status: Home / Self Care | Payer: Medicare HMO | Source: Ambulatory Visit | Attending: Specialist | Admitting: Specialist

## 2020-06-16 DIAGNOSIS — R911 Solitary pulmonary nodule: Secondary | ICD-10-CM | POA: Insufficient documentation

## 2020-06-16 DIAGNOSIS — I7121 Aneurysm of the ascending aorta, without rupture: Secondary | ICD-10-CM

## 2020-06-16 HISTORY — DX: Aneurysm of the ascending aorta, without rupture: I71.21

## 2020-06-18 ENCOUNTER — Other Ambulatory Visit
Admission: RE | Admit: 2020-06-18 | Discharge: 2020-06-18 | Disposition: A | Discharge status: Home / Self Care | Payer: Medicare HMO | Source: Ambulatory Visit | Attending: Internal Medicine | Admitting: Internal Medicine

## 2020-06-18 DIAGNOSIS — M7989 Other specified soft tissue disorders: Secondary | ICD-10-CM | POA: Diagnosis present

## 2020-06-18 DIAGNOSIS — R509 Fever, unspecified: Secondary | ICD-10-CM | POA: Diagnosis present

## 2020-06-18 DIAGNOSIS — M79661 Pain in right lower leg: Secondary | ICD-10-CM | POA: Insufficient documentation

## 2020-06-18 LAB — FIBRIN DERIVATIVES D-DIMER (ARMC ONLY): Fibrin derivatives D-dimer (ARMC): 572.4 ng/mL (FEU) — ABNORMAL HIGH (ref 0.00–499.00)

## 2020-06-19 ENCOUNTER — Emergency Department
Admission: EM | Admit: 2020-06-19 | Discharge: 2020-06-19 | Disposition: A | Discharge status: Home / Self Care | Payer: Medicare HMO | Attending: Emergency Medicine | Admitting: Emergency Medicine

## 2020-06-19 ENCOUNTER — Other Ambulatory Visit: Payer: Self-pay

## 2020-06-19 ENCOUNTER — Emergency Department: Payer: Medicare HMO

## 2020-06-19 DIAGNOSIS — I2510 Atherosclerotic heart disease of native coronary artery without angina pectoris: Secondary | ICD-10-CM | POA: Diagnosis not present

## 2020-06-19 DIAGNOSIS — Z7901 Long term (current) use of anticoagulants: Secondary | ICD-10-CM | POA: Diagnosis not present

## 2020-06-19 DIAGNOSIS — I10 Essential (primary) hypertension: Secondary | ICD-10-CM | POA: Insufficient documentation

## 2020-06-19 DIAGNOSIS — Z79899 Other long term (current) drug therapy: Secondary | ICD-10-CM | POA: Insufficient documentation

## 2020-06-19 DIAGNOSIS — Z87891 Personal history of nicotine dependence: Secondary | ICD-10-CM | POA: Diagnosis not present

## 2020-06-19 DIAGNOSIS — L03115 Cellulitis of right lower limb: Secondary | ICD-10-CM | POA: Diagnosis not present

## 2020-06-19 DIAGNOSIS — R2241 Localized swelling, mass and lump, right lower limb: Secondary | ICD-10-CM | POA: Diagnosis present

## 2020-06-19 DIAGNOSIS — Z7982 Long term (current) use of aspirin: Secondary | ICD-10-CM | POA: Diagnosis not present

## 2020-06-19 DIAGNOSIS — J449 Chronic obstructive pulmonary disease, unspecified: Secondary | ICD-10-CM | POA: Diagnosis not present

## 2020-06-19 NOTE — ED Notes (Signed)
Pt discussed with Dr. Cinda Quest, no orders for blood work at this time

## 2020-06-19 NOTE — ED Notes (Addendum)
Pt laying in stretcher, no c/o pain. Daughter bedside. VSS, NAD noted.  No other needs at this time.

## 2020-06-19 NOTE — ED Provider Notes (Signed)
[facility] Emergency Department Provider Note  ____________________________________________  Time seen: Approximately 6:55 PM  I have reviewed the triage vital signs and the nursing notes.   HISTORY  Chief Complaint Leg Swelling    HPI David Hardin is a 86 y.o. male with a past history of atrial fibrillation COPD hypertension who comes the ED for evaluation of right leg pain redness and swelling.  Patient had a wound on the right leg about 3 weeks ago, which was recently reinjured during a beach trip.  He started having swelling and redness, and went to urgent care yesterday due to reported fever, found to have a leukocytosis of 22,000.  They gave a dose of ceftriaxone and started him on Omnicef and doxycycline.  He returned today for reassessment, and leukocytosis improved to 18,000, and fever has resolved.  However, the pain and swelling of the right lower leg has increased from mid calf up to the level of the knee.  Denies chest pain or shortness of breath.  He is on Eliquis chronically and has been compliant.  States that he feels fine, eating and drinking normally, energy level normal, mobility normal.      Past Medical History:  Diagnosis Date  . Atrial fibrillation ([place])   . COPD (chronic obstructive pulmonary disease) ([place])   . Coronary artery disease   . Hyperlipidemia   . Hypertension      Patient Active Problem List   Diagnosis Date Noted  . COPD exacerbation ([place]) 12/11/2018  . Sepsis ([place]) 12/15/2017     Past Surgical History:  Procedure Laterality Date  . BYPASS AXILLA/BRACHIAL ARTERY    . CHOLECYSTECTOMY    . COLONOSCOPY WITH PROPOFOL N/A 10/02/2019   Procedure: COLONOSCOPY WITH PROPOFOL;  Surgeon: Toledo, Benay Pike, MD;  Location: [facility] ENDOSCOPY;  Service: Gastroenterology;  Laterality: N/A;  . HIP SURGERY Left      Prior to Admission medications   Medication Sig Start Date End Date Taking? Authorizing Provider  amLODipine  (NORVASC) 5 MG tablet Take 5 mg by mouth daily. 10/21/18   [provider]  apixaban (ELIQUIS) 5 MG TABS tablet Take 5 mg by mouth every 12 (twelve) hours. 11/25/18   [provider]  aspirin 81 MG chewable tablet Chew 81 mg by mouth daily.    [provider]  atorvastatin (LIPITOR) 20 MG tablet Take 20 mg by mouth daily. 05/14/18   [provider]  fluticasone (FLONASE) 50 MCG/ACT nasal spray Place 1 spray into the nose 2 (two) times daily. 04/29/18 04/29/19  [provider]  fluticasone furoate-vilanterol (BREO ELLIPTA) 100-25 MCG/INH AEPB Inhale 1 puff into the lungs daily. 11/22/18   [provider]  furosemide (LASIX) 20 MG tablet Take 20 mg by mouth daily.    [provider]  ipratropium-albuterol (DUONEB) 0.5-2.5 (3) MG/3ML SOLN Take 3 mLs by nebulization every 6 (six) hours as needed (shortness of breath, wheezing.). 12/13/18   Henreitta Leber, MD  methocarbamol (ROBAXIN) 500 MG tablet Take 500 mg by mouth 2 (two) times daily.    [provider]  montelukast (SINGULAIR) 10 MG tablet Take 10 mg by mouth at bedtime. 12/06/18   [provider]  Multiple Vitamins-Minerals (PRESERVISION AREDS) CAPS Take 1 capsule by mouth 2 (two) times daily.    [provider]  predniSONE (DELTASONE) 10 MG tablet Label  & dispense according to the schedule below. 5 Pills PO for 1 day then, 4 Pills PO for 1 day, 3 Pills  PO for 1 day, 2 Pills PO for 1 day, 1 Pill PO for 1 days then STOP. Patient not taking: Reported on 10/02/2019 12/13/18   Henreitta Leber, MD  sildenafil (REVATIO) 20 MG tablet TAKE 3 TABLETS BY MOUTH ONCE DAILY PRIORTO SEX AS NEEDED 05/23/18   [provider]     Allergies Patient has no known allergies.   Family History  Problem Relation Age of Onset  . Breast cancer Mother   . Heart attack Father     Social History Social History   Tobacco Use  . Smoking status: Former Research scientist (life sciences)  . Smokeless  tobacco: Never Used  Vaping Use  . Vaping Use: Never used  Substance Use Topics  . Alcohol use: Yes  . Drug use: No    Review of Systems  Constitutional: Positive fever and chills.  ENT:   No sore throat. No rhinorrhea. Cardiovascular:   No chest pain or syncope. Respiratory:   No dyspnea or cough. Gastrointestinal:   Negative for abdominal pain, vomiting and diarrhea.  Musculoskeletal: Positive right leg pain and swelling as above All other systems reviewed and are negative except as documented above in ROS and HPI.  ____________________________________________   PHYSICAL EXAM:  VITAL SIGNS: ED Triage Vitals  Enc Vitals Group     BP 06/19/20 1248 (!) 136/42     Pulse Rate 06/19/20 1248 71     Resp 06/19/20 1827 18     Temp 06/19/20 1248 98.3 F (36.8 C)     Temp Source 06/19/20 1248 Oral     SpO2 06/19/20 1248 97 %     Weight 06/19/20 1251 217 lb (98.4 kg)     Height 06/19/20 1251 5' 7.7" (1.72 m)     Head Circumference --      Peak Flow --      Pain Score 06/19/20 1255 0     Pain Loc --      Pain Edu? --      Excl. in [place]? --     Vital signs reviewed, nursing assessments reviewed.   Constitutional:   Alert and oriented. Non-toxic appearance. Eyes:   Conjunctivae are normal. EOMI. PERRL. ENT      Head:   Normocephalic and atraumatic.      Nose:   Wearing a mask.      Mouth/Throat:   Wearing a mask.      Neck:   No meningismus. Full ROM. Hematological/Lymphatic/Immunilogical:   No cervical lymphadenopathy. Cardiovascular:   RRR. Symmetric bilateral radial and DP pulses.  No murmurs. Cap refill less than 2 seconds. Respiratory:   Normal respiratory effort without tachypnea/retractions. Breath sounds are clear and equal bilaterally. No wheezes/rales/rhonchi. Gastrointestinal:   Soft and nontender. Non distended. There is no CVA tenderness.  No rebound, rigidity, or guarding.  Musculoskeletal:   Normal range of motion in all extremities. No joint effusions.   Warmth erythema tenderness and swelling of the right leg from ankle to just below the knee.  No drainage fluctuance or crepitus.  No pain with range of motion of the knee or ankle.  No palpable cord.  Calf circumference is about 4 cm greater on the right than left. Neurologic:   Normal speech and language.  Motor grossly intact. No acute focal neurologic deficits are appreciated.  Skin:    Skin is warm, dry and intact. No rash noted.  No petechiae, purpura, or bullae.  ____________________________________________    LABS (pertinent positives/negatives) (all labs ordered are listed, but  only abnormal results are displayed) Labs Reviewed - No data to display ____________________________________________   EKG    ____________________________________________    RADIOLOGY  US Venous Img Lower Unilateral Right  Result Date: 06/19/2020 CLINICAL DATA:  Right lower extremity edema and erythema, pain EXAM: RIGHT LOWER EXTREMITY VENOUS DOPPLER ULTRASOUND TECHNIQUE: Gray-scale sonography with compression, as well as color and duplex ultrasound, were performed to evaluate the deep venous system(s) from the level of the common femoral vein through the popliteal and proximal calf veins. COMPARISON:  03/29/2020 FINDINGS: VENOUS Normal compressibility of the common femoral, superficial femoral, and popliteal veins, as well as the visualized calf veins. Visualized portions of profunda femoral vein and great saphenous vein unremarkable. No filling defects to suggest DVT on grayscale or color Doppler imaging. Doppler waveforms show normal direction of venous flow, normal respiratory plasticity and response to augmentation. Limited views of the contralateral common femoral vein are unremarkable. OTHER There is a 2.7 x 1.2 x 2.2 cm lymph node in the right inguinal region, likely reactive. Limitations: none IMPRESSION: 1. No evidence of deep venous thrombosis within the right lower extremity. Electronically  Signed   By: Randa Ngo M.D.   On: 06/19/2020 20:15    ____________________________________________   PROCEDURES Procedures  ____________________________________________  DIFFERENTIAL DIAGNOSIS   DVT, cellulitis  CLINICAL IMPRESSION / ASSESSMENT AND PLAN / ED COURSE  Medications ordered in the ED: Medications - No data to display  Pertinent labs & imaging results that were available during my care of the patient were reviewed by me and considered in my medical decision making (see chart for details).  David Hardin was evaluated in Emergency Department on 06/19/2020 for the symptoms described in the history of present illness. He was evaluated in the context of the global COVID-19 pandemic, which necessitated consideration that the patient might be at risk for infection with the SARS-CoV-2 virus that causes COVID-19. Institutional protocols and algorithms that pertain to the evaluation of patients at risk for COVID-19 are in a state of rapid change based on information released by regulatory bodies including the CDC and federal and state organizations. These policies and algorithms were followed during the patient's care in the ED.   Patient presents with right leg inflammatory changes consistent with cellulitis.  By report from the labs obtained over the last 2 days by urgent care, he is clinically improving, overall feels well, and wishes to continue outpatient management.  He is not septic, nontoxic, tolerating oral intake.  I will obtain ultrasound to rule out DVT which overall is unlikely given his Eliquis use.  Clinical Course as of Jun 19 2104  Sat Jun 19, 2020  2059 Ultrasound negative for DVT.  Patient states he still feels great.  I just rechecked his temperature and it is 98.3, vital signs remained stable and essentially normal.  He continues to desire outpatient management and has antibiotics at home.  Discussed return precautions for any worsening or new symptoms.    [PS]    Clinical Course User Index [PS] Carrie Mew, MD     ____________________________________________   FINAL CLINICAL IMPRESSION(S) / ED DIAGNOSES    Final diagnoses:  Cellulitis of right leg     ED Discharge Orders    None      Portions of this note were generated with dragon dictation software. Dictation errors may occur despite best attempts at proofreading.   Carrie Mew, MD 06/19/20 2105

## 2020-06-19 NOTE — ED Triage Notes (Signed)
Pt arrives via POV from [facility] for concerns of right leg swelling and infection. Pt states he cut his right calf 3 weeks ago on a piece of wood. Pt had bloodwork done at [facility] that showed improving wbc at 18 down from 22.6 yesterday. Pt takes eliquis. Pt in NAD, skin warm and dry.

## 2021-11-21 ENCOUNTER — Other Ambulatory Visit: Payer: Self-pay

## 2021-11-21 ENCOUNTER — Ambulatory Visit (INDEPENDENT_AMBULATORY_CARE_PROVIDER_SITE_OTHER): Payer: Managed Care, Other (non HMO) | Admitting: Dermatology

## 2021-11-21 DIAGNOSIS — L578 Other skin changes due to chronic exposure to nonionizing radiation: Secondary | ICD-10-CM | POA: Diagnosis not present

## 2021-11-21 DIAGNOSIS — L570 Actinic keratosis: Secondary | ICD-10-CM

## 2021-11-21 DIAGNOSIS — D229 Melanocytic nevi, unspecified: Secondary | ICD-10-CM

## 2021-11-21 DIAGNOSIS — C4492 Squamous cell carcinoma of skin, unspecified: Secondary | ICD-10-CM

## 2021-11-21 DIAGNOSIS — L820 Inflamed seborrheic keratosis: Secondary | ICD-10-CM | POA: Diagnosis not present

## 2021-11-21 DIAGNOSIS — D0462 Carcinoma in situ of skin of left upper limb, including shoulder: Secondary | ICD-10-CM

## 2021-11-21 DIAGNOSIS — Z1283 Encounter for screening for malignant neoplasm of skin: Secondary | ICD-10-CM | POA: Diagnosis not present

## 2021-11-21 DIAGNOSIS — D1800 Hemangioma unspecified site: Secondary | ICD-10-CM | POA: Diagnosis not present

## 2021-11-21 DIAGNOSIS — L814 Other melanin hyperpigmentation: Secondary | ICD-10-CM

## 2021-11-21 DIAGNOSIS — D485 Neoplasm of uncertain behavior of skin: Secondary | ICD-10-CM

## 2021-11-21 DIAGNOSIS — L821 Other seborrheic keratosis: Secondary | ICD-10-CM

## 2021-11-21 HISTORY — DX: Squamous cell carcinoma of skin, unspecified: C44.92

## 2021-11-21 NOTE — Progress Notes (Signed)
New Patient Visit  Subjective  David Hardin is a 87 y.o. male who presents for the following: Other (No history of skin cancer - desires UBSE today). The patient presents for Upper Body Skin Exam (UBSE) for skin cancer screening and mole check.  The patient has spots, moles and lesions to be evaluated, some may be new or changing and the patient has concerns that these could be cancer.  The following portions of the chart were reviewed this encounter and updated as appropriate:   Tobacco  Allergies  Meds  Problems  Med Hx  Surg Hx  Fam Hx     Review of Systems:  No other skin or systemic complaints except as noted in HPI or Assessment and Plan.  Objective  Well appearing patient in no apparent distress; mood and affect are within normal limits.  All skin waist up examined.  Nasal bridge Erythematous thin papules/macules with gritty scale.   Left dorsum hand Hyperkeratotic papule 1.1 cm  Left dorsum forearm Hyperkeratotic papule 1.2 cm  Face (2) Erythematous keratotic or waxy stuck-on papule or plaque.     Assessment & Plan   Lentigines - Scattered tan macules - Due to sun exposure - Benign-appearing, observe - Recommend daily broad spectrum sunscreen SPF 30+ to sun-exposed areas, reapply every 2 hours as needed. - Call for any changes  Seborrheic Keratoses - Stuck-on, waxy, tan-brown papules and/or plaques  - Benign-appearing - Discussed benign etiology and prognosis. - Observe - Call for any changes  Melanocytic Nevi - Tan-brown and/or pink-flesh-colored symmetric macules and papules - Benign appearing on exam today - Observation - Call clinic for new or changing moles - Recommend daily use of broad spectrum spf 30+ sunscreen to sun-exposed areas.   Hemangiomas - Red papules - Discussed benign nature - Observe - Call for any changes  Actinic Damage - Chronic condition, secondary to cumulative UV/sun exposure - diffuse scaly erythematous  macules with underlying dyspigmentation - Recommend daily broad spectrum sunscreen SPF 30+ to sun-exposed areas, reapply every 2 hours as needed.  - Staying in the shade or wearing long sleeves, sun glasses (UVA+UVB protection) and wide brim hats (4-inch brim around the entire circumference of the hat) are also recommended for sun protection.  - Call for new or changing lesions.  Skin cancer screening performed today.  AK (actinic keratosis) Nasal bridge Destruction of lesion - Nasal bridge Complexity: simple   Destruction method: cryotherapy   Informed consent: discussed and consent obtained   Timeout:  patient name, date of birth, surgical site, and procedure verified Lesion destroyed using liquid nitrogen: Yes   Region frozen until ice ball extended beyond lesion: Yes   Outcome: patient tolerated procedure well with no complications   Post-procedure details: wound care instructions given    Neoplasm of uncertain behavior of skin (2) Left dorsum hand Epidermal / dermal shaving  Lesion diameter (cm):  1.1 Informed consent: discussed and consent obtained   Timeout: patient name, date of birth, surgical site, and procedure verified   Procedure prep:  Patient was prepped and draped in usual sterile fashion Prep type:  Isopropyl alcohol Anesthesia: the lesion was anesthetized in a standard fashion   Anesthetic:  1% lidocaine w/ epinephrine 1-100,000 buffered w/ 8.4% NaHCO3 Instrument used: flexible razor blade   Hemostasis achieved with: pressure, aluminum chloride and electrodesiccation   Outcome: patient tolerated procedure well   Post-procedure details: sterile dressing applied and wound care instructions given   Dressing type: bandage and petrolatum  Destruction of lesion Complexity: extensive   Destruction method: electrodesiccation and curettage   Informed consent: discussed and consent obtained   Timeout:  patient name, date of birth, surgical site, and procedure  verified Procedure prep:  Patient was prepped and draped in usual sterile fashion Prep type:  Isopropyl alcohol Anesthesia: the lesion was anesthetized in a standard fashion   Anesthetic:  1% lidocaine w/ epinephrine 1-100,000 buffered w/ 8.4% NaHCO3 Curettage performed in three different directions: Yes   Electrodesiccation performed over the curetted area: Yes   Lesion length (cm):  1.1 Lesion width (cm):  1.1 Margin per side (cm):  0.2 Final wound size (cm):  1.5 Hemostasis achieved with:  pressure and aluminum chloride Outcome: patient tolerated procedure well with no complications   Post-procedure details: sterile dressing applied and wound care instructions given   Dressing type: bandage and petrolatum    Specimen 1 - Surgical pathology Differential Diagnosis: Hypertrophic AK vs SCC vs other Check Margins: No EDC today  Left dorsum forearm Epidermal / dermal shaving  Lesion diameter (cm):  1.2 Informed consent: discussed and consent obtained   Timeout: patient name, date of birth, surgical site, and procedure verified   Procedure prep:  Patient was prepped and draped in usual sterile fashion Prep type:  Isopropyl alcohol Anesthesia: the lesion was anesthetized in a standard fashion   Anesthetic:  1% lidocaine w/ epinephrine 1-100,000 buffered w/ 8.4% NaHCO3 Instrument used: flexible razor blade   Hemostasis achieved with: pressure, aluminum chloride and electrodesiccation   Outcome: patient tolerated procedure well   Post-procedure details: sterile dressing applied and wound care instructions given   Dressing type: bandage and petrolatum    Destruction of lesion Complexity: extensive   Destruction method: electrodesiccation and curettage   Informed consent: discussed and consent obtained   Timeout:  patient name, date of birth, surgical site, and procedure verified Procedure prep:  Patient was prepped and draped in usual sterile fashion Prep type:  Isopropyl  alcohol Anesthesia: the lesion was anesthetized in a standard fashion   Anesthetic:  1% lidocaine w/ epinephrine 1-100,000 buffered w/ 8.4% NaHCO3 Curettage performed in three different directions: Yes   Electrodesiccation performed over the curetted area: Yes   Lesion length (cm):  1.2 Lesion width (cm):  1.2 Margin per side (cm):  0.2 Final wound size (cm):  1.6 Hemostasis achieved with:  pressure and aluminum chloride Outcome: patient tolerated procedure well with no complications   Post-procedure details: sterile dressing applied and wound care instructions given   Dressing type: bandage and petrolatum    Specimen 2 - Surgical pathology Differential Diagnosis: Hypertrophic AK vs SCC vs other  Check Margins: No EDC today  Inflamed seborrheic keratosis Face Destruction of lesion - Face Complexity: simple   Destruction method: cryotherapy   Informed consent: discussed and consent obtained   Timeout:  patient name, date of birth, surgical site, and procedure verified Lesion destroyed using liquid nitrogen: Yes   Region frozen until ice ball extended beyond lesion: Yes   Outcome: patient tolerated procedure well with no complications   Post-procedure details: wound care instructions given    Skin cancer screening  Return in about 6 months (around 05/22/2022) for Follow up.  I, Ashok Cordia, CMA, am acting as scribe for Sarina Ser, MD . Documentation: I have reviewed the above documentation for accuracy and completeness, and I agree with the above.  Sarina Ser, MD

## 2021-11-21 NOTE — Patient Instructions (Addendum)
Cryotherapy Aftercare  Wash gently with soap and water everyday.   Apply Vaseline and Band-Aid daily until healed.    Wound Care Instructions  Cleanse wound gently with soap and water once a day then pat dry with clean gauze. Apply a thing coat of Petrolatum (petroleum jelly, "Vaseline") over the wound (unless you have an allergy to this). We recommend that you use a new, sterile tube of Vaseline. Do not pick or remove scabs. Do not remove the yellow or white "healing tissue" from the base of the wound.  Cover the wound with fresh, clean, nonstick gauze and secure with paper tape. You may use Band-Aids in place of gauze and tape if the would is small enough, but would recommend trimming much of the tape off as there is often too much. Sometimes Band-Aids can irritate the skin.  You should call the office for your biopsy report after 1 week if you have not already been contacted.  If you experience any problems, such as abnormal amounts of bleeding, swelling, significant bruising, significant pain, or evidence of infection, please call the office immediately.  FOR ADULT SURGERY PATIENTS: If you need something for pain relief you may take 1 extra strength Tylenol (acetaminophen) AND 2 Ibuprofen (200mg each) together every 4 hours as needed for pain. (do not take these if you are allergic to them or if you have a reason you should not take them.) Typically, you may only need pain medication for 1 to 3 days.        If You Need Anything After Your Visit  If you have any questions or concerns for your doctor, please call our main line at [phone] and press option 4 to reach your doctor's medical assistant. If no one answers, please leave a voicemail as directed and we will return your call as soon as possible. Messages left after 4 pm will be answered the following business day.   You may also send us a message via MyChart. We typically respond to MyChart messages within 1-2 business  days.  For prescription refills, please ask your pharmacy to contact our office. Our fax number is [phone].  If you have an urgent issue when the clinic is closed that cannot wait until the next business day, you can page your doctor at the number below.    Please note that while we do our best to be available for urgent issues outside of office hours, we are not available 24/7.   If you have an urgent issue and are unable to reach us, you may choose to seek medical care at your doctor's office, retail clinic, urgent care center, or emergency room.  If you have a medical emergency, please immediately call 911 or go to the emergency department.  Pager Numbers  - Dr. Kowalski: [phone]  - Dr. Moye: [phone]  - Dr. Stewart: [phone]  In the event of inclement weather, please call our main line at [phone] for an update on the status of any delays or closures.  Dermatology Medication Tips: Please keep the boxes that topical medications come in in order to help keep track of the instructions about where and how to use these. Pharmacies typically print the medication instructions only on the boxes and not directly on the medication tubes.   If your medication is too expensive, please contact our office at [phone] option 4 or send us a message through MyChart.   We are unable to tell what your co-pay for medications will be   in advance as this is different depending on your insurance coverage. However, we may be able to find a substitute medication at lower cost or fill out paperwork to get insurance to cover a needed medication.   If a prior authorization is required to get your medication covered by your insurance company, please allow us 1-2 business days to complete this process.  Drug prices often vary depending on where the prescription is filled and some pharmacies may offer cheaper prices.  The website www.goodrx.com contains coupons for medications through  different pharmacies. The prices here do not account for what the cost may be with help from insurance (it may be cheaper with your insurance), but the website can give you the price if you did not use any insurance.  - You can print the associated coupon and take it with your prescription to the pharmacy.  - You may also stop by our office during regular business hours and pick up a GoodRx coupon card.  - If you need your prescription sent electronically to a different pharmacy, notify our office through [facility] MyChart or by phone at [phone] option 4.     Si Usted Necesita Algo Despus de Su Visita  Tambin puede enviarnos un mensaje a travs de MyChart. Por lo general respondemos a los mensajes de MyChart en el transcurso de 1 a 2 das hbiles.  Para renovar recetas, por favor pida a su farmacia que se ponga en contacto con nuestra oficina. Nuestro nmero de fax es el [phone].  Si tiene un asunto urgente cuando la clnica est cerrada y que no puede esperar hasta el siguiente da hbil, puede llamar/localizar a su doctor(a) al nmero que aparece a continuacin.   Por favor, tenga en cuenta que aunque hacemos todo lo posible para estar disponibles para asuntos urgentes fuera del horario de oficina, no estamos disponibles las 24 horas del da, los 7 das de la semana.   Si tiene un problema urgente y no puede comunicarse con nosotros, puede optar por buscar atencin mdica  en el consultorio de su doctor(a), en una clnica privada, en un centro de atencin urgente o en una sala de emergencias.  Si tiene una emergencia mdica, por favor llame inmediatamente al 911 o vaya a la sala de emergencias.  Nmeros de bper  - Dr. Kowalski: [phone]  - Dra. Moye: [phone]  - Dra. Stewart: [phone]  En caso de inclemencias del tiempo, por favor llame a nuestra lnea principal al [phone] para una actualizacin sobre el estado de cualquier retraso o cierre.  Consejos  para la medicacin en dermatologa: Por favor, guarde las cajas en las que vienen los medicamentos de uso tpico para ayudarle a seguir las instrucciones sobre dnde y cmo usarlos. Las farmacias generalmente imprimen las instrucciones del medicamento slo en las cajas y no directamente en los tubos del medicamento.   Si su medicamento es muy caro, por favor, pngase en contacto con nuestra oficina llamando al [phone] y presione la opcin 4 o envenos un mensaje a travs de MyChart.   No podemos decirle cul ser su copago por los medicamentos por adelantado ya que esto es diferente dependiendo de la cobertura de su seguro. Sin embargo, es posible que podamos encontrar un medicamento sustituto a menor costo o llenar un formulario para que el seguro cubra el medicamento que se considera necesario.   Si se requiere una autorizacin previa para que su compaa de seguros cubra su medicamento, por favor permtanos de 1 a   2 das hbiles para completar este proceso.  Los precios de los medicamentos varan con frecuencia dependiendo del lugar de dnde se surte la receta y alguna farmacias pueden ofrecer precios ms baratos.  El sitio web www.goodrx.com tiene cupones para medicamentos de diferentes farmacias. Los precios aqu no tienen en cuenta lo que podra costar con la ayuda del seguro (puede ser ms barato con su seguro), pero el sitio web puede darle el precio si no utiliz ningn seguro.  - Puede imprimir el cupn correspondiente y llevarlo con su receta a la farmacia.  - Tambin puede pasar por nuestra oficina durante el horario de atencin regular y recoger una tarjeta de cupones de GoodRx.  - Si necesita que su receta se enve electrnicamente a una farmacia diferente, informe a nuestra oficina a travs de MyChart de [facility] o por telfono llamando al [phone] y presione la opcin 4.  

## 2021-11-24 ENCOUNTER — Telehealth: Payer: Self-pay

## 2021-11-24 NOTE — Telephone Encounter (Signed)
-----   Message from Ralene Bathe, MD sent at 11/23/2021  5:11 PM EST ----- Diagnosis 1. 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 Skin , left dorsum forearm SQUAMOUS CELL CARCINOMA IN SITU, HYPERTROPHIC  1&2 - both cancer - SCC in situ Superficial Both already treated Recheck next visit

## 2021-11-24 NOTE — Telephone Encounter (Signed)
Patient informed of pathology results. He verbalized understanding and had no further questions.

## 2021-11-26 ENCOUNTER — Encounter: Payer: Self-pay | Admitting: Dermatology

## 2022-04-20 ENCOUNTER — Other Ambulatory Visit
Admission: RE | Admit: 2022-04-20 | Discharge: 2022-04-20 | Disposition: A | Discharge status: Home / Self Care | Payer: Managed Care, Other (non HMO) | Source: Ambulatory Visit | Attending: Family Medicine | Admitting: Family Medicine

## 2022-04-20 DIAGNOSIS — R0602 Shortness of breath: Secondary | ICD-10-CM | POA: Insufficient documentation

## 2022-04-20 LAB — BRAIN NATRIURETIC PEPTIDE: B Natriuretic Peptide: 369.3 pg/mL — ABNORMAL HIGH (ref 0.0–100.0)

## 2022-04-21 ENCOUNTER — Other Ambulatory Visit: Payer: Self-pay | Admitting: Family Medicine

## 2022-04-21 DIAGNOSIS — R9389 Abnormal findings on diagnostic imaging of other specified body structures: Secondary | ICD-10-CM

## 2022-04-21 DIAGNOSIS — R911 Solitary pulmonary nodule: Secondary | ICD-10-CM

## 2022-04-26 ENCOUNTER — Ambulatory Visit
Admission: RE | Admit: 2022-04-26 | Discharge: 2022-04-26 | Disposition: A | Discharge status: Home / Self Care | Payer: Medicare HMO | Source: Ambulatory Visit | Attending: Family Medicine | Admitting: Family Medicine

## 2022-04-26 DIAGNOSIS — R9389 Abnormal findings on diagnostic imaging of other specified body structures: Secondary | ICD-10-CM | POA: Diagnosis not present

## 2022-04-26 DIAGNOSIS — R911 Solitary pulmonary nodule: Secondary | ICD-10-CM | POA: Insufficient documentation

## 2022-04-26 MED ORDER — IOHEXOL 300 MG/ML  SOLN
75.0000 mL | Freq: Once | INTRAMUSCULAR | Status: AC | PRN
Start: 2022-04-26 — End: 2022-04-26
  Administered 2022-04-26: 75 mL via INTRAVENOUS

## 2022-05-29 ENCOUNTER — Ambulatory Visit (INDEPENDENT_AMBULATORY_CARE_PROVIDER_SITE_OTHER): Payer: Medicare HMO | Admitting: Dermatology

## 2022-05-29 DIAGNOSIS — D1800 Hemangioma unspecified site: Secondary | ICD-10-CM

## 2022-05-29 DIAGNOSIS — L570 Actinic keratosis: Secondary | ICD-10-CM

## 2022-05-29 DIAGNOSIS — L578 Other skin changes due to chronic exposure to nonionizing radiation: Secondary | ICD-10-CM

## 2022-05-29 DIAGNOSIS — L814 Other melanin hyperpigmentation: Secondary | ICD-10-CM

## 2022-05-29 DIAGNOSIS — L821 Other seborrheic keratosis: Secondary | ICD-10-CM

## 2022-05-29 DIAGNOSIS — L820 Inflamed seborrheic keratosis: Secondary | ICD-10-CM | POA: Diagnosis not present

## 2022-05-29 DIAGNOSIS — D229 Melanocytic nevi, unspecified: Secondary | ICD-10-CM

## 2022-05-29 DIAGNOSIS — Z85828 Personal history of other malignant neoplasm of skin: Secondary | ICD-10-CM

## 2022-05-29 DIAGNOSIS — Z1283 Encounter for screening for malignant neoplasm of skin: Secondary | ICD-10-CM | POA: Diagnosis not present

## 2022-05-29 NOTE — Progress Notes (Unsigned)
Follow-Up Visit   Subjective  David Hardin is a 88 y.o. male who presents for the following: Annual Exam (6 month follow up, hx of scc, hx of aks, hx of isks. ). The patient presents for Upper Body Skin Exam (UBSE) for skin cancer screening and mole check.  The patient has spots, moles and lesions to be evaluated, some may be new or changing and the patient has concerns that these could be cancer.  The following portions of the chart were reviewed this encounter and updated as appropriate:  Tobacco  Allergies  Meds  Problems  Med Hx  Surg Hx  Fam Hx     Review of Systems: No other skin or systemic complaints except as noted in HPI or Assessment and Plan.  Objective  Well appearing patient in no apparent distress; mood and affect are within normal limits.  All skin waist up examined.  face and ears x 4   , right hand x 2 (6) Erythematous thin papules/macules with gritty scale.   left arm and hand x 5 (5) Erythematous stuck-on, waxy papule or plaque   Assessment & Plan  Actinic keratosis (6) face and ears x 4   , right hand x 2 Actinic keratoses are precancerous spots that appear secondary to cumulative UV radiation exposure/sun exposure over time. They are chronic with expected duration over 1 year. A portion of actinic keratoses will progress to squamous cell carcinoma of the skin. It is not possible to reliably predict which spots will progress to skin cancer and so treatment is recommended to prevent development of skin cancer.  Recommend daily broad spectrum sunscreen SPF 30+ to sun-exposed areas, reapply every 2 hours as needed.  Recommend staying in the shade or wearing long sleeves, sun glasses (UVA+UVB protection) and wide brim hats (4-inch brim around the entire circumference of the hat). Call for new or changing lesions.  Destruction of lesion - face and ears x 4   , right hand x 2 Complexity: simple   Destruction method: cryotherapy   Informed consent:  discussed and consent obtained   Timeout:  patient name, date of birth, surgical site, and procedure verified Lesion destroyed using liquid nitrogen: Yes   Region frozen until ice ball extended beyond lesion: Yes   Outcome: patient tolerated procedure well with no complications   Post-procedure details: wound care instructions given    Inflamed seborrheic keratosis (5) left arm and hand x 5 Symptomatic, irritating, patient would like treated. Destruction of lesion - left arm and hand x 5 Complexity: extensive   Destruction method: electrodesiccation and curettage   Informed consent: discussed and consent obtained   Timeout:  patient name, date of birth, surgical site, and procedure verified Procedure prep:  Patient was prepped and draped in usual sterile fashion Prep type:  Isopropyl alcohol Anesthesia: the lesion was anesthetized in a standard fashion   Anesthetic:  1% lidocaine w/ epinephrine 1-100,000 buffered w/ 8.4% NaHCO3 Curettage performed in three different directions: Yes   Electrodesiccation performed over the curetted area: Yes   Hemostasis achieved with:  pressure, aluminum chloride and electrodesiccation Outcome: patient tolerated procedure well with no complications   Post-procedure details: sterile dressing applied and wound care instructions given   Dressing type: bandage and petrolatum   Additional details:  Prior to procedure, discussed risks of blister formation, small wound, skin dyspigmentation, or rare scar following cryotherapy. Recommend Vaseline ointment to treated areas while healing.  Lentigines - Scattered tan macules - Due to  sun exposure - Benign-appearing, observe - Recommend daily broad spectrum sunscreen SPF 30+ to sun-exposed areas, reapply every 2 hours as needed. - Call for any changes  Seborrheic Keratoses - Stuck-on, waxy, tan-brown papules and/or plaques  - Benign-appearing - Discussed benign etiology and prognosis. - Observe - Call for  any changes  Melanocytic Nevi - Tan-brown and/or pink-flesh-colored symmetric macules and papules - Benign appearing on exam today - Observation - Call clinic for new or changing moles - Recommend daily use of broad spectrum spf 30+ sunscreen to sun-exposed areas.   Hemangiomas - Red papules - Discussed benign nature - Observe - Call for any changes  Actinic Damage - Chronic condition, secondary to cumulative UV/sun exposure - diffuse scaly erythematous macules with underlying dyspigmentation - Recommend daily broad spectrum sunscreen SPF 30+ to sun-exposed areas, reapply every 2 hours as needed.  - Staying in the shade or wearing long sleeves, sun glasses (UVA+UVB protection) and wide brim hats (4-inch brim around the entire circumference of the hat) are also recommended for sun protection.  - Call for new or changing lesions.  Skin cancer screening performed today. History of Basal Cell Carcinoma of the Skin at left dorsum hand and left dorsum forearm 11/2021 - No evidence of recurrence today - Recommend regular full body skin exams - Recommend daily broad spectrum sunscreen SPF 30+ to sun-exposed areas, reapply every 2 hours as needed.  - Call if any new or changing lesions are noted between office visits  Return for jan - feb tbse .  IRuthell Rummage, CMA, am acting as scribe for Sarina Ser, MD. Documentation: I have reviewed the above documentation for accuracy and completeness, and I agree with the above.  Sarina Ser, MD

## 2022-05-29 NOTE — Patient Instructions (Addendum)
Actinic keratoses are precancerous spots that appear secondary to cumulative UV radiation exposure/sun exposure over time. They are chronic with expected duration over 1 year. A portion of actinic keratoses will progress to squamous cell carcinoma of the skin. It is not possible to reliably predict which spots will progress to skin cancer and so treatment is recommended to prevent development of skin cancer.  Recommend daily broad spectrum sunscreen SPF 30+ to sun-exposed areas, reapply every 2 hours as needed.  Recommend staying in the shade or wearing long sleeves, sun glasses (UVA+UVB protection) and wide brim hats (4-inch brim around the entire circumference of the hat). Call for new or changing lesions. Actinic keratoses are precancerous spots that appear secondary to cumulative UV radiation exposure/sun exposure over time. They are chronic with expected duration over 1 year. A portion of actinic keratoses will progress to squamous cell carcinoma of the skin. It is not possible to reliably predict which spots will progress to skin cancer and so treatment is recommended to prevent development of skin cancer.   Seborrheic Keratosis  What causes seborrheic keratoses? Seborrheic keratoses are harmless, common skin growths that first appear during adult life.  As time goes by, more growths appear.  Some people may develop a large number of them.  Seborrheic keratoses appear on both covered and uncovered body parts.  They are not caused by sunlight.  The tendency to develop seborrheic keratoses can be inherited.  They vary in color from skin-colored to gray, brown, or even black.  They can be either smooth or have a rough, warty surface.   Seborrheic keratoses are superficial and look as if they were stuck on the skin.  Under the microscope this type of keratosis looks like layers upon layers of skin.  That is why at times the top layer may seem to fall off, but the rest of the growth remains and re-grows.     Treatment Seborrheic keratoses do not need to be treated, but can easily be removed in the office.  Seborrheic keratoses often cause symptoms when they rub on clothing or jewelry.  Lesions can be in the way of shaving.  If they become inflamed, they can cause itching, soreness, or burning.  Removal of a seborrheic keratosis can be accomplished by freezing, burning, or surgery. If any spot bleeds, scabs, or grows rapidly, please return to have it checked, as these can be an indication of a skin cancer.   Melanoma ABCDEs  Melanoma is the most dangerous type of skin cancer, and is the leading cause of death from skin disease.  You are more likely to develop melanoma if you: Have light-colored skin, light-colored eyes, or red or blond hair Spend a lot of time in the sun Tan regularly, either outdoors or in a tanning bed Have had blistering sunburns, especially during childhood Have a close family member who has had a melanoma Have atypical moles or large birthmarks  Early detection of melanoma is key since treatment is typically straightforward and cure rates are extremely high if we catch it early.   The first sign of melanoma is often a change in a mole or a new dark spot.  The ABCDE system is a way of remembering the signs of melanoma.  A for asymmetry:  The two halves do not match. B for border:  The edges of the growth are irregular. C for color:  A mixture of colors are present instead of an even brown color. D for diameter:  Melanomas  are usually (but not always) greater than 35m - the size of a pencil eraser. E for evolution:  The spot keeps changing in size, shape, and color.  Please check your skin once per month between visits. You can use a small mirror in front and a large mirror behind you to keep an eye on the back side or your body.   If you see any new or changing lesions before your next follow-up, please call to schedule a visit.  Please continue daily skin protection  including broad spectrum sunscreen SPF 30+ to sun-exposed areas, reapplying every 2 hours as needed when you're outdoors.   Staying in the shade or wearing long sleeves, sun glasses (UVA+UVB protection) and wide brim hats (4-inch brim around the entire circumference of the hat) are also recommended for sun protection.       Due to recent changes in healthcare laws, you may see results of your pathology and/or laboratory studies on MyChart before the doctors have had a chance to review them. We understand that in some cases there may be results that are confusing or concerning to you. Please understand that not all results are received at the same time and often the doctors may need to interpret multiple results in order to provide you with the best plan of care or course of treatment. Therefore, we ask that you please give u[place]2 business days to thoroughly review all your results before contacting the office for clarification. Should we see a critical lab result, you will be contacted sooner.   If You Need Anything After Your Visit  If you have any questions or concerns for your doctor, please call our main line at [phone] press option 4 to reach your doctor's medical assistant. If no one answers, please leave a voicemail as directed and we will return your call as soon as possible. Messages left after 4 pm will be answered the following business day.   You may also send [place] message via [place] We typically respond to MyChart messages within 1-2 business days.  For prescription refills, please ask your pharmacy to contact our office. Our fax number is [phone]  If you have an urgent issue when the clinic is closed that cannot wait until the next business day, you can page your doctor at the number below.    Please note that while we do our best to be available for urgent issues outside of office hours, we are not available 24/7.   If you have an urgent issue and are unable to  reach u[place] you may choose to seek medical care at your doctor's office, retail clinic, urgent care center, or emergency room.  If you have a medical emergency, please immediately call 911 or go to the emergency department.  Pager Numbers  - Dr. KNehemiah Massed [phone] - Dr. MLaurence Ferrari [phone] - Dr. SNicole Kindred [phone] In the event of inclement weather, please call our main line at [phone] an update on the status of any delays or closures.  Dermatology Medication Tips: Please keep the boxes that topical medications come in in order to help keep track of the instructions about where and how to use these. Pharmacies typically print the medication instructions only on the boxes and not directly on the medication tubes.   If your medication is too expensive, please contact our office at [phone] 4 or send u[place] message through [place]   We are unable to tell what your co-pay  for medications will be in advance as this is different depending on your insurance coverage. However, we may be able to find a substitute medication at lower cost or fill out paperwork to get insurance to cover a needed medication.   If a prior authorization is required to get your medication covered by your insurance company, please allow [place] 1-2 business days to complete this process.  Drug prices often vary depending on where the prescription is filled and some pharmacies may offer cheaper prices.  The website www.goodrx.com contains coupons for medications through different pharmacies. The prices here do not account for what the cost may be with help from insurance (it may be cheaper with your insurance), but the website can give you the price if you did not use any insurance.  - You can print the associated coupon and take it with your prescription to the pharmacy.  - You may also stop by our office during regular business hours and pick up a GoodRx coupon card.  - If you need your prescription  sent electronically to a different pharmacy, notify our office through [facility] or by phone at [phone] option 4.     Si Usted Necesita Algo Despus de Su Visita  Tambin puede enviarnos un mensaje a travs de Pharmacist, community. Por lo general respondemos a los mensajes de MyChart en el transcurso de 1 a 2 das hbiles.  Para renovar recetas, por favor pida a su farmacia que se ponga en contacto con nuestra oficina. Harland Dingwall de fax es [place] [phone].  Si tiene un asunto urgente cuando la clnica est cerrada y que no puede esperar hasta el siguiente da hbil, puede llamar/localizar a su doctor(a) al nmero que aparece a continuacin.   Por favor, tenga en cuenta que aunque hacemos todo lo posible para estar disponibles para asuntos urgentes fuera del horario de [place], no estamos disponibles las 24 horas del da, los 7 das de la [place].   Si tiene un problema urgente y no puede comunicarse con nosotros, puede optar por buscar atencin mdica  en el consultorio de su doctor(a), en una clnica privada, en un centro de atencin urgente o en una sala de emergencias.  Si tiene Engineering geologist, por favor llame inmediatamente al 911 o vaya a la sala de emergencias.  Nmeros de bper  - Dr. Nehemiah Massed: [phone]  - Dra. Moye: [phone]  - Dra. Nicole Kindred: [phone]  En caso de inclemencias del [place], por favor llame a Johnsie Kindred principal al [phone] para una actualizacin sobre el [place] de cualquier retraso o cierre.  Consejos para la medicacin en dermatologa: Por favor, guarde las cajas en las que vienen los medicamentos de uso tpico para ayudarle a seguir las instrucciones sobre dnde y cmo usarlos. Las farmacias generalmente imprimen las instrucciones del medicamento slo en las cajas y no directamente en los tubos del [place].   Si su medicamento es muy caro, por favor, pngase en contacto con Zigmund Daniel llamando al [phone] y presione  la opcin 4 o envenos un mensaje a travs de Pharmacist, community.   No podemos decirle cul ser su copago por los medicamentos por adelantado ya que esto es diferente dependiendo de la cobertura de su seguro. Sin embargo, es posible que podamos encontrar un medicamento sustituto a Electrical engineer un formulario para que el seguro cubra el medicamento que se considera necesario.   Si se requiere una autorizacin previa para que su compaa de seguros [place] su medicamento, por favor  permtanos de 1 a 2 das hbiles para completar este proceso.  Los precios de los medicamentos varan con frecuencia dependiendo del Environmental consultant de dnde se surte la receta y alguna farmacias pueden ofrecer precios ms baratos.  El sitio web www.goodrx.com tiene cupones para medicamentos de Airline pilot. Los precios aqu no tienen en cuenta lo que podra costar con la ayuda del seguro (puede ser ms barato con su seguro), pero el sitio web puede darle el precio si no utiliz Research scientist (physical sciences).  - Puede imprimir el cupn correspondiente y llevarlo con su receta a la farmacia.  - Tambin puede pasar por nuestra oficina durante el horario de atencin regular y Charity fundraiser una tarjeta de cupones de GoodRx.  - Si necesita que su receta se enve electrnicamente a una farmacia diferente, informe a nuestra oficina a travs de MyChart de [place] o por telfono llamando al [phone] y presione la opcin 4.

## 2022-05-30 ENCOUNTER — Encounter: Payer: Self-pay | Admitting: Dermatology

## 2022-09-08 ENCOUNTER — Inpatient Hospital Stay
Admission: EM | Admit: 2022-09-08 | Discharge: 2022-09-12 | DRG: 291 | Disposition: A | Discharge status: Home / Self Care | Payer: Medicare HMO | Attending: Family Medicine | Admitting: Family Medicine

## 2022-09-08 ENCOUNTER — Emergency Department: Payer: Medicare HMO

## 2022-09-08 ENCOUNTER — Other Ambulatory Visit: Payer: Self-pay

## 2022-09-08 DIAGNOSIS — I2510 Atherosclerotic heart disease of native coronary artery without angina pectoris: Secondary | ICD-10-CM | POA: Diagnosis present

## 2022-09-08 DIAGNOSIS — I2489 Other forms of acute ischemic heart disease: Secondary | ICD-10-CM | POA: Diagnosis present

## 2022-09-08 DIAGNOSIS — I509 Heart failure, unspecified: Secondary | ICD-10-CM

## 2022-09-08 DIAGNOSIS — I110 Hypertensive heart disease with heart failure: Principal | ICD-10-CM | POA: Diagnosis present

## 2022-09-08 DIAGNOSIS — J440 Chronic obstructive pulmonary disease with acute lower respiratory infection: Secondary | ICD-10-CM | POA: Diagnosis present

## 2022-09-08 DIAGNOSIS — Z79899 Other long term (current) drug therapy: Secondary | ICD-10-CM

## 2022-09-08 DIAGNOSIS — R339 Retention of urine, unspecified: Secondary | ICD-10-CM | POA: Diagnosis not present

## 2022-09-08 DIAGNOSIS — Z7951 Long term (current) use of inhaled steroids: Secondary | ICD-10-CM

## 2022-09-08 DIAGNOSIS — J189 Pneumonia, unspecified organism: Secondary | ICD-10-CM

## 2022-09-08 DIAGNOSIS — Z7982 Long term (current) use of aspirin: Secondary | ICD-10-CM

## 2022-09-08 DIAGNOSIS — Z951 Presence of aortocoronary bypass graft: Secondary | ICD-10-CM | POA: Diagnosis not present

## 2022-09-08 DIAGNOSIS — Z1152 Encounter for screening for COVID-19: Secondary | ICD-10-CM | POA: Diagnosis not present

## 2022-09-08 DIAGNOSIS — E785 Hyperlipidemia, unspecified: Secondary | ICD-10-CM | POA: Diagnosis present

## 2022-09-08 DIAGNOSIS — Z8249 Family history of ischemic heart disease and other diseases of the circulatory system: Secondary | ICD-10-CM

## 2022-09-08 DIAGNOSIS — Z803 Family history of malignant neoplasm of breast: Secondary | ICD-10-CM

## 2022-09-08 DIAGNOSIS — Z85828 Personal history of other malignant neoplasm of skin: Secondary | ICD-10-CM

## 2022-09-08 DIAGNOSIS — J449 Chronic obstructive pulmonary disease, unspecified: Secondary | ICD-10-CM

## 2022-09-08 DIAGNOSIS — E669 Obesity, unspecified: Secondary | ICD-10-CM | POA: Diagnosis present

## 2022-09-08 DIAGNOSIS — I5033 Acute on chronic diastolic (congestive) heart failure: Secondary | ICD-10-CM | POA: Diagnosis present

## 2022-09-08 DIAGNOSIS — Z6834 Body mass index (BMI) 34.0-34.9, adult: Secondary | ICD-10-CM

## 2022-09-08 DIAGNOSIS — Z87891 Personal history of nicotine dependence: Secondary | ICD-10-CM

## 2022-09-08 DIAGNOSIS — Z91119 Patient's noncompliance with dietary regimen due to unspecified reason: Secondary | ICD-10-CM | POA: Diagnosis not present

## 2022-09-08 DIAGNOSIS — I10 Essential (primary) hypertension: Secondary | ICD-10-CM

## 2022-09-08 DIAGNOSIS — R7989 Other specified abnormal findings of blood chemistry: Secondary | ICD-10-CM

## 2022-09-08 DIAGNOSIS — R0902 Hypoxemia: Secondary | ICD-10-CM | POA: Diagnosis present

## 2022-09-08 DIAGNOSIS — I160 Hypertensive urgency: Secondary | ICD-10-CM | POA: Diagnosis present

## 2022-09-08 DIAGNOSIS — I480 Paroxysmal atrial fibrillation: Secondary | ICD-10-CM | POA: Diagnosis present

## 2022-09-08 DIAGNOSIS — Z7901 Long term (current) use of anticoagulants: Secondary | ICD-10-CM | POA: Diagnosis not present

## 2022-09-08 LAB — BASIC METABOLIC PANEL
Anion gap: 12 (ref 5–15)
BUN: 17 mg/dL (ref 8–23)
CO2: 27 mmol/L (ref 22–32)
Calcium: 8.6 mg/dL — ABNORMAL LOW (ref 8.9–10.3)
Chloride: 101 mmol/L (ref 98–111)
Creatinine, Ser: 0.97 mg/dL (ref 0.61–1.24)
GFR, Estimated: >60 mL/min (ref >60)
Glucose, Bld: 136 mg/dL — ABNORMAL HIGH (ref 70–99)
Potassium: 3.1 mmol/L — ABNORMAL LOW (ref 3.5–5.1)
Sodium: 140 mmol/L (ref 135–145)

## 2022-09-08 LAB — CBC WITH DIFFERENTIAL/PLATELET
Abs Immature Granulocytes: 0.2 10*3/uL — ABNORMAL HIGH (ref 0.00–0.07)
Basophils Absolute: 0.1 10*3/uL (ref 0.0–0.1)
Basophils Relative: 1 %
Eosinophils Absolute: 0 10*3/uL (ref 0.0–0.5)
Eosinophils Relative: 0 %
HCT: 43.4 % (ref 39.0–52.0)
Hemoglobin: 14.3 g/dL (ref 13.0–17.0)
Immature Granulocytes: 1 %
Lymphocytes Relative: 3 %
Lymphs Abs: 0.7 10*3/uL (ref 0.7–4.0)
MCH: 30.5 pg (ref 26.0–34.0)
MCHC: 32.9 g/dL (ref 30.0–36.0)
MCV: 92.5 fL (ref 80.0–100.0)
Monocytes Absolute: 1.6 10*3/uL — ABNORMAL HIGH (ref 0.1–1.0)
Monocytes Relative: 8 %
Neutro Abs: 17 10*3/uL — ABNORMAL HIGH (ref 1.7–7.7)
Neutrophils Relative %: 87 %
Platelets: 672 10*3/uL — ABNORMAL HIGH (ref 150–400)
RBC: 4.69 MIL/uL (ref 4.22–5.81)
RDW: 16.1 % — ABNORMAL HIGH (ref 11.5–15.5)
WBC: 19.6 10*3/uL — ABNORMAL HIGH (ref 4.0–10.5)
nRBC: 0 % (ref 0.0–0.2)

## 2022-09-08 LAB — BRAIN NATRIURETIC PEPTIDE: B Natriuretic Peptide: 544.8 pg/mL — ABNORMAL HIGH (ref 0.0–100.0)

## 2022-09-08 LAB — TROPONIN I (HIGH SENSITIVITY): Troponin I (High Sensitivity): 33 ng/L — ABNORMAL HIGH (ref <18)

## 2022-09-08 MED ORDER — FUROSEMIDE 10 MG/ML IJ SOLN
60.0000 mg | Freq: Once | INTRAMUSCULAR | Status: AC
  Administered 2022-09-09: 60 mg via INTRAVENOUS
  Filled 2022-09-08: qty 8

## 2022-09-08 MED ORDER — SODIUM CHLORIDE 0.9 % IV SOLN
500.0000 mg | Freq: Once | INTRAVENOUS | Status: DC
  Filled 2022-09-08: qty 5

## 2022-09-08 MED ORDER — POTASSIUM CHLORIDE CRYS ER 20 MEQ PO TBCR
40.0000 meq | EXTENDED_RELEASE_TABLET | Freq: Once | ORAL | Status: AC
  Administered 2022-09-09: 40 meq via ORAL
  Filled 2022-09-08: qty 2

## 2022-09-08 MED ORDER — IOHEXOL 350 MG/ML SOLN
80.0000 mL | Freq: Once | INTRAVENOUS | Status: AC | PRN
  Administered 2022-09-08: 80 mL via INTRAVENOUS

## 2022-09-08 MED ORDER — SODIUM CHLORIDE 0.9 % IV SOLN
1.0000 g | Freq: Once | INTRAVENOUS | Status: AC
  Administered 2022-09-09: 1 g via INTRAVENOUS
  Filled 2022-09-08: qty 10

## 2022-09-08 NOTE — ED Provider Notes (Signed)
[facility] Provider Note    Event Date/Time   First MD Initiated Contact with Patient 09/08/22 2127     (approximate)   History   Shortness of Breath   HPI  David Hardin is a 88 y.o. male who presents to the emergency department today because of concerns for shortness of breath.  The symptoms started yesterday.  Have persistent throughout today.  Also has some congestion and chills.  Patient states that he went to his cardiologist office yesterday and they asked him to take extra dose of his Lasix.  He had been having some weight gain as well as some leg swelling as well.  He states this is happened to him in the past where he had fluid around his heart.  Patient denies any known sick contacts.     Physical Exam   Triage Vital Signs: ED Triage Vitals  Enc Vitals Group     BP 09/08/22 2108 (!) 213/73     Pulse Rate 09/08/22 2108 86     Resp 09/08/22 2108 (!) 28     Temp 09/08/22 2108 98.8 F (37.1 C)     Temp Source 09/08/22 2108 Oral     SpO2 09/08/22 2108 90 %     Weight 09/08/22 2110 218 lb (98.9 kg)     Height 09/08/22 2110 '5\' 7"'$  (1.702 m)     Head Circumference --      Peak Flow --      Pain Score 09/08/22 2109 0   Most recent vital signs: Vitals:   09/08/22 2108 09/08/22 2111  BP: (!) 213/73 (!) 176/63  Pulse: 86   Resp: (!) 28   Temp: 98.8 F (37.1 C)   SpO2: 90%     General: Awake, alert, oriented. CV:  Good peripheral perfusion. Irregular rhythm. Resp:  Increased work of breathing. Diffuse expiratory wheezing. Abd:  No distention.  Other:  Pitting edema to bilateral lower extremities.    ED Results / Procedures / Treatments   Labs (all labs ordered are listed, but only abnormal results are displayed) Labs Reviewed  CBC WITH DIFFERENTIAL/PLATELET - Abnormal; Notable for the following components:      Result Value   WBC 19.6 (*)    RDW 16.1 (*)    Platelets 672 (*)    Neutro Abs 17.0 (*)    Monocytes Absolute 1.6 (*)     Abs Immature Granulocytes 0.20 (*)    All other components within normal limits  BASIC METABOLIC PANEL - Abnormal; Notable for the following components:   Potassium 3.1 (*)    Glucose, Bld 136 (*)    Calcium 8.6 (*)    All other components within normal limits  BRAIN NATRIURETIC PEPTIDE - Abnormal; Notable for the following components:   B Natriuretic Peptide 544.8 (*)    All other components within normal limits  TROPONIN I (HIGH SENSITIVITY) - Abnormal; Notable for the following components:   Troponin I (High Sensitivity) 33 (*)    All other components within normal limits  RESP PANEL BY RT-PCR (FLU A&B, COVID) ARPGX2  CULTURE, BLOOD (ROUTINE X 2)  CULTURE, BLOOD (ROUTINE X 2)  LACTIC ACID, PLASMA  LACTIC ACID, PLASMA  TROPONIN I (HIGH SENSITIVITY)     EKG  I, Nance Pear, attending physician, personally viewed and interpreted this EKG  EKG Time: 2110 Rate: 83 Rhythm: atrial fibrillation Axis: right axis deviation Intervals: qtc 459 QRS: narrow, q waves v1 ST changes: no st elevation  Impression: abnormal ekg   RADIOLOGY I independently interpreted and visualized the CXR. My interpretation: cardiomegaly Radiology interpretation:  IMPRESSION:  Cardiomegaly with mild central congestion.    I independently interpreted and visualized the CT angio PE. My interpretation: No large PE. Opacities in lower lungs. Radiology interpretation:  IMPRESSION:  1. Negative for acute pulmonary embolus.  2. Cardiomegaly with small bilateral pleural effusions.  3. Multifocal bilateral areas of tree-in-bud density and  heterogeneous ground-glass densities consistent with multifocal  infection/pneumonia, to include atypical organisms.  4. Mild mediastinal adenopathy is probably reactive  5. Ascending aortic aneurysm up to 4.4 cm. Recommend annual imaging  followup by CTA or MRA. This recommendation follows 2010  ACCF/AHA/AATS/ACR/ASA/SCA/SCAI/SIR/STS/SVM Guidelines for the   Diagnosis and Management of Patients with Thoracic Aortic Disease.  Circulation. 2010; 121: G017-C944. Aortic aneurysm NOS (ICD10-I71.9)    Aortic Atherosclerosis (ICD10-I70.0).     PROCEDURES:  Critical Care performed: No  Procedures   MEDICATIONS ORDERED IN ED: Medications - No data to display   IMPRESSION / MDM / [place] / ED COURSE  I reviewed the triage vital signs and the nursing notes.                              Differential diagnosis includes, but is not limited to, pneumonia, covid, pneumothorax, PE.  Patient's presentation is most consistent with acute presentation with potential threat to life or bodily function.  Patient presented to the emergency department today because of concerns for shortness of breath that started yesterday.  Patient afebrile.  Patient was hypoxic so was put on nasal cannula.  Chest x-ray without any obvious etiology.  Patient has a history of CHF BNP was elevated.  Because of this he was given some Lasix as well as potassium.  I did obtain a CT angio to evaluate for PE.  This did not show any pulmonary embolism but was concerning for infection.  Patient did have leukocytosis and blood work.  He was started on IV antibiotics.      FINAL CLINICAL IMPRESSION(S) / ED DIAGNOSES   Final diagnoses:  Hypoxia  Community acquired pneumonia, unspecified laterality      Note:  This document was prepared using Dragon voice recognition software and may include unintentional dictation errors.    Nance Pear, MD 09/08/22 [phone]

## 2022-09-08 NOTE — ED Notes (Signed)
Patient taken to CT.

## 2022-09-08 NOTE — ED Triage Notes (Signed)
Patient BIB EMS for evaluation of SHOB.  Hx of CHF and COPD.  Per EMS, patient c/o increased SHOB today.  Was found to have RA saturations at 85%.  Does not wear oxygen at baseline.

## 2022-09-09 DIAGNOSIS — I10 Essential (primary) hypertension: Secondary | ICD-10-CM

## 2022-09-09 DIAGNOSIS — Z951 Presence of aortocoronary bypass graft: Secondary | ICD-10-CM

## 2022-09-09 DIAGNOSIS — I480 Paroxysmal atrial fibrillation: Secondary | ICD-10-CM

## 2022-09-09 DIAGNOSIS — Z7901 Long term (current) use of anticoagulants: Secondary | ICD-10-CM

## 2022-09-09 DIAGNOSIS — I5033 Acute on chronic diastolic (congestive) heart failure: Secondary | ICD-10-CM | POA: Diagnosis not present

## 2022-09-09 DIAGNOSIS — R7989 Other specified abnormal findings of blood chemistry: Secondary | ICD-10-CM

## 2022-09-09 DIAGNOSIS — I160 Hypertensive urgency: Secondary | ICD-10-CM

## 2022-09-09 DIAGNOSIS — J189 Pneumonia, unspecified organism: Secondary | ICD-10-CM

## 2022-09-09 LAB — TROPONIN I (HIGH SENSITIVITY): Troponin I (High Sensitivity): 185 ng/L (ref <18)

## 2022-09-09 LAB — RESP PANEL BY RT-PCR (FLU A&B, COVID) ARPGX2
Influenza A by PCR: NEGATIVE
Influenza B by PCR: NEGATIVE
SARS Coronavirus 2 by RT PCR: NEGATIVE

## 2022-09-09 LAB — LACTIC ACID, PLASMA: Lactic Acid, Venous: 1.6 mmol/L (ref 0.5–1.9)

## 2022-09-09 MED ORDER — ACETAMINOPHEN 325 MG PO TABS: 650.0000 mg | ORAL_TABLET | Freq: Four times a day (QID) | ORAL | Status: DC | PRN

## 2022-09-09 MED ORDER — FLUTICASONE FUROATE-VILANTEROL 100-25 MCG/ACT IN AEPB
1.0000 | INHALATION_SPRAY | Freq: Every day | RESPIRATORY_TRACT | Status: DC
  Filled 2022-09-09: qty 28

## 2022-09-09 MED ORDER — SODIUM CHLORIDE 0.9 % IV SOLN
500.0000 mg | INTRAVENOUS | Status: DC
  Administered 2022-09-09 – 2022-09-12 (×4): 500 mg via INTRAVENOUS
  Filled 2022-09-09 (×3): qty 5

## 2022-09-09 MED ORDER — POTASSIUM CHLORIDE 20 MEQ PO PACK
40.0000 meq | PACK | Freq: Once | ORAL | Status: AC
  Administered 2022-09-09: 40 meq via ORAL
  Filled 2022-09-09: qty 2

## 2022-09-09 MED ORDER — ACETAMINOPHEN 650 MG RE SUPP: 650.0000 mg | Freq: Four times a day (QID) | RECTAL | Status: DC | PRN

## 2022-09-09 MED ORDER — MELATONIN 5 MG PO TABS
5.0000 mg | ORAL_TABLET | Freq: Every day | ORAL | Status: DC
  Administered 2022-09-10 – 2022-09-11 (×2): 5 mg via ORAL
  Filled 2022-09-09 (×2): qty 1

## 2022-09-09 MED ORDER — FLUTICASONE FUROATE-VILANTEROL 100-25 MCG/ACT IN AEPB
1.0000 | INHALATION_SPRAY | Freq: Every day | RESPIRATORY_TRACT | Status: DC
  Administered 2022-09-10 – 2022-09-12 (×3): 1 via RESPIRATORY_TRACT
  Filled 2022-09-09: qty 28

## 2022-09-09 MED ORDER — APIXABAN 5 MG PO TABS
5.0000 mg | ORAL_TABLET | Freq: Two times a day (BID) | ORAL | Status: DC
  Administered 2022-09-09 – 2022-09-12 (×7): 5 mg via ORAL
  Filled 2022-09-09 (×8): qty 1

## 2022-09-09 MED ORDER — ONDANSETRON HCL 4 MG PO TABS: 4.0000 mg | ORAL_TABLET | Freq: Four times a day (QID) | ORAL | Status: DC | PRN

## 2022-09-09 MED ORDER — ONDANSETRON HCL 4 MG/2ML IJ SOLN: 4.0000 mg | Freq: Four times a day (QID) | INTRAMUSCULAR | Status: DC | PRN

## 2022-09-09 MED ORDER — IPRATROPIUM-ALBUTEROL 0.5-2.5 (3) MG/3ML IN SOLN: 3.0000 mL | Freq: Four times a day (QID) | RESPIRATORY_TRACT | Status: DC | PRN

## 2022-09-09 MED ORDER — SODIUM CHLORIDE 0.9 % IV SOLN
2.0000 g | INTRAVENOUS | Status: DC
  Administered 2022-09-09 – 2022-09-12 (×3): 2 g via INTRAVENOUS
  Filled 2022-09-09 (×3): qty 20

## 2022-09-09 MED ORDER — ASPIRIN 81 MG PO CHEW
81.0000 mg | CHEWABLE_TABLET | Freq: Every day | ORAL | Status: DC
  Administered 2022-09-09 – 2022-09-12 (×4): 81 mg via ORAL
  Filled 2022-09-09 (×4): qty 1

## 2022-09-09 MED ORDER — POTASSIUM CHLORIDE CRYS ER 20 MEQ PO TBCR
40.0000 meq | EXTENDED_RELEASE_TABLET | Freq: Every day | ORAL | Status: DC
  Administered 2022-09-09 – 2022-09-12 (×4): 40 meq via ORAL
  Filled 2022-09-09 (×4): qty 2

## 2022-09-09 MED ORDER — ATORVASTATIN CALCIUM 20 MG PO TABS
20.0000 mg | ORAL_TABLET | Freq: Every day | ORAL | Status: DC
  Administered 2022-09-09 – 2022-09-12 (×4): 20 mg via ORAL
  Filled 2022-09-09 (×4): qty 1

## 2022-09-09 MED ORDER — FUROSEMIDE 10 MG/ML IJ SOLN
40.0000 mg | Freq: Two times a day (BID) | INTRAMUSCULAR | Status: DC
  Administered 2022-09-09 – 2022-09-12 (×7): 40 mg via INTRAVENOUS
  Filled 2022-09-09 (×7): qty 4

## 2022-09-09 MED ORDER — AMLODIPINE BESYLATE 5 MG PO TABS
5.0000 mg | ORAL_TABLET | Freq: Every day | ORAL | Status: DC
  Administered 2022-09-09 – 2022-09-12 (×4): 5 mg via ORAL
  Filled 2022-09-09 (×4): qty 1

## 2022-09-09 NOTE — ED Notes (Signed)
Patient is resting. Denies pain. Family at bedside.

## 2022-09-09 NOTE — Assessment & Plan Note (Signed)
Continue apixaban.  Not currently on rate control agents

## 2022-09-09 NOTE — Assessment & Plan Note (Signed)
-   Continue amlodipine ?

## 2022-09-09 NOTE — ED Notes (Addendum)
Pt was complaining of abdominal pain and needing to "pee". This RN noticed 3 urinals with less than 30 ml of urine. This RN bladder scanned pt. Scanner stated >427m. This RN inserted foley catheter and 6056moutput. Pt is more comfortable and no longer complaining of abdominal pain. Dr. IsEllender Hoseotified. Keeping foley in place due to urinary retention.

## 2022-09-09 NOTE — Assessment & Plan Note (Deleted)
-   Continue amlodipine ?

## 2022-09-09 NOTE — ED Notes (Signed)
Patient currently resting in bed.  Reports he was able to have a BM and "feels better."

## 2022-09-09 NOTE — ED Notes (Signed)
Patient is alert and oriented, food tray at bedside. Family at bedside. Patient is on 3L O2 via [place].

## 2022-09-09 NOTE — ED Notes (Signed)
Patient and family members report that patient normally takes "Nyquil" to sleep.  Unknown amount taken nightly.  Request medication to assist with sleeping at night

## 2022-09-09 NOTE — Assessment & Plan Note (Addendum)
Rocephin and azithromycin DuoNebs as needed, antitussives Supplemental oxygen if needed Follow blood cultures

## 2022-09-09 NOTE — Assessment & Plan Note (Deleted)
Continue aspirin and atorvastatin.  Not on metoprolol

## 2022-09-09 NOTE — ED Notes (Signed)
Patient requesting for condom cath be removed.  Patient has not been able to tolerate PureWick or condom cath.  Have tried multiple times without success.  Patient continues to want to sit on bedside commode.  Patient and family informed to importance of having patient remain in bed.  Patient refusing to stay in bed.  Demanding to sit on commode.

## 2022-09-09 NOTE — ED Notes (Signed)
Patient returned to bed at this time after sitting on commode for a length of time.  Reports "it is causing me to have cramps and my stomach hurts."  Unsure of what is causing pain or discomfort.

## 2022-09-09 NOTE — ED Notes (Signed)
Report given to Alex RN.

## 2022-09-09 NOTE — ED Notes (Signed)
Patient is sitting up on bed, drinking a drink that family brought in . NAD.

## 2022-09-09 NOTE — Assessment & Plan Note (Addendum)
IV Lasix.  Patient not currently on ACE, ARB or beta-blockers for unclear reasons Daily weights with intake and output monitoring Last echo from June with EF over 55%

## 2022-09-09 NOTE — IPAL (Signed)
  Interdisciplinary Goals of Care Family Meeting   Date carried out: 09/09/2022  Location of the meeting: Bedside  Member's involved: Physician and Family Member or next of kin  Durable Power of Attorney or Loss adjuster, chartered: patient    Discussion: We discussed goals of care for [employer] .    Code status: Full Code  Disposition: Continue current acute care  Time spent for the meeting: [place], MD  09/09/2022, 5:23 AM

## 2022-09-09 NOTE — Assessment & Plan Note (Signed)
CAD with history of CABG Troponin 33-1 85, suspect secondary to demand from anemia and CHF Patient denies chest pain and EKG nonacute Continue aspirin and atorvastatin.  Not on metoprolol for unclear reason

## 2022-09-09 NOTE — ED Notes (Signed)
Request made for transport to the floor ?

## 2022-09-09 NOTE — Assessment & Plan Note (Signed)
Not currently exacerbated DuoNebs as needed  

## 2022-09-09 NOTE — ED Notes (Signed)
Patient remains on bedside commode at this time.  Family at bedside.  Will continue to monitor and start additional medications once patient returns to bed

## 2022-09-09 NOTE — Care Plan (Signed)
This 88 years old male with PMH significant for paroxysmal A-fib on Eliquis, CAD s/p CABG, hypertension, hyperlipidemia, diastolic CHF with recent LVEF of 55% on echo on 05/12/2022, recently seen by his cardiologist 2 days ago with lower extremity edema and 7 pound weight gain.  His Lasix dose was increased.  Patient reports no improvement , So he presented in the ED.  Work-up in the ED reveals blood pressure 213/73,  troponin 33> 187, BNP 545,  WBC 19 K, lactic acid 1.6.  CTA chest ruled out pulmonary embolism but shows cardiomegaly with findings consistent with CHF and multifocal pneumonia.  Patient was given Lasix 60 mg and started on azithromycin and Rocephin for community-acquired pneumonia.  Patient was seen and examined at bedside.  Appears improved.  Patient denies any chest pain.  Elevated troponin could be due to demand ischemia in the setting of multifocal pneumonia and acute on chronic CHF.  Heart rate remains reasonably controlled.  Continue home medications.

## 2022-09-09 NOTE — H&P (Signed)
History and Physical    Patient: David Hardin WGY:[id number] DOB: Jan 22, 1927 DOA: 09/08/2022 DOS: the patient was seen and examined on 09/09/2022 PCP: Derinda Late, MD  Patient coming from: Home  Chief Complaint:  Chief Complaint  Patient presents with   Shortness of Breath    HPI: RAFEAL Hardin is a 88 y.o. male with medical history significant for Paroxysmal atrial fibrillation on Eliquis, CAD s/p CABG, HTN, HLD, diastolic CHF (EF 77% on 08/13/9029,), recently seen by his cardiologist on 09/07/2022 with lower extremity edema and a 7 pound weight gain, and prescribed an increased dose of Lasix who presents to the ED by EMS with increased shortness of breath.  He denies chest pain, cough, fever or chills.  EMS recorded O2 sat of 85% on room air. ED course and data review: BP 213/73 with respiratory rate of 28 and O2 sat 90% on 2 L.  Pulse 86 and normothermic. Troponin 33 and BNP 544.  WBC 19,000, lactic acid pending, potassium 3.1.EKG, pulse [place] viewed and interpreted with A-fib at 63 with nonspecific ST-T wave changes CTA PE protocol negative for PE but shows cardiomegaly with findings consistent with CHF and multifocal pneumonia as described below: IMPRESSION: 1. Negative for acute pulmonary embolus. 2. Cardiomegaly with small bilateral pleural effusions. 3. Multifocal bilateral areas of tree-in-bud density and heterogeneous ground-glass densities consistent with multifocal infection/pneumonia, to include atypical organisms. 4. Mild mediastinal adenopathy is probably reactive 5. Ascending aortic aneurysm up to 4.4 cm. Recommend annual imaging   Patient treated with Lasix 60 given azithromycin and Rocephin and hospitalist consulted for admission.   Review of Systems: As mentioned in the history of present illness. All other systems reviewed and are negative.  Past Medical History:  Diagnosis Date   Atrial fibrillation ([place])    COPD (chronic obstructive pulmonary disease) ([place])     Coronary artery disease    Hyperlipidemia    Hypertension    Squamous cell carcinoma of skin 11/21/2021   L dorsum hand - ED&C   Squamous cell carcinoma of skin 11/21/2021   L dorsum forearm - ED&C   Past Surgical History:  Procedure Laterality Date   BYPASS AXILLA/BRACHIAL ARTERY     CHOLECYSTECTOMY     COLONOSCOPY WITH PROPOFOL N/A 10/02/2019   Procedure: COLONOSCOPY WITH PROPOFOL;  Surgeon: Toledo, Benay Pike, MD;  Location: [facility] ENDOSCOPY;  Service: Gastroenterology;  Laterality: N/A;   HIP SURGERY Left    Social History:  reports that he has quit smoking. He has never used smokeless tobacco. He reports current alcohol use. He reports that he does not use drugs.  No Known Allergies  Family History  Problem Relation Age of Onset   Breast cancer Mother    Heart attack Father     Prior to Admission medications   Medication Sig Start Date End Date Taking? Authorizing Provider  amLODipine (NORVASC) 5 MG tablet Take 5 mg by mouth daily. 10/21/18   [provider]  apixaban (ELIQUIS) 5 MG TABS tablet Take 5 mg by mouth every 12 (twelve) hours. 11/25/18   [provider]  aspirin 81 MG chewable tablet Chew 81 mg by mouth daily.    [provider]  atorvastatin (LIPITOR) 20 MG tablet Take 20 mg by mouth daily. 05/14/18   [provider]  fluticasone (FLONASE) 50 MCG/ACT nasal spray Place 1 spray into the nose 2 (two) times daily. 04/29/18 04/29/19  [provider]  fluticasone furoate-vilanterol (BREO ELLIPTA) 100-25 MCG/INH AEPB Inhale 1 puff  into the lungs daily. 11/22/18   [provider]  furosemide (LASIX) 20 MG tablet Take 20 mg by mouth daily.    [provider]  ipratropium-albuterol (DUONEB) 0.5-2.5 (3) MG/3ML SOLN Take 3 mLs by nebulization every 6 (six) hours as needed (shortness of breath, wheezing.). 12/13/18   Henreitta Leber, MD  methocarbamol (ROBAXIN) 500 MG tablet Take 500 mg by mouth 2 (two) times daily.     [provider]  montelukast (SINGULAIR) 10 MG tablet Take 10 mg by mouth at bedtime. 12/06/18   [provider]  Multiple Vitamins-Minerals (PRESERVISION AREDS) CAPS Take 1 capsule by mouth 2 (two) times daily.    [provider]  predniSONE (DELTASONE) 10 MG tablet Label  & dispense according to the schedule below. 5 Pills PO for 1 day then, 4 Pills PO for 1 day, 3 Pills PO for 1 day, 2 Pills PO for 1 day, 1 Pill PO for 1 days then STOP. 12/13/18   Henreitta Leber, MD  sildenafil (REVATIO) 20 MG tablet TAKE 3 TABLETS BY MOUTH ONCE DAILY PRIORTO SEX AS NEEDED 05/23/18   [provider]    Physical Exam: Vitals:   09/08/22 2108 09/08/22 2110 09/08/22 2111 09/08/22 2130  BP: (!) 213/73  (!) 176/63 (!) 200/87  Pulse: 86   89  Resp: (!) 28   (!) 35  Temp: 98.8 F (37.1 C)     TempSrc: Oral     SpO2: 90%   91%  Weight:  98.9 kg    Height:  '5\' 7"'$  (1.702 m)     Physical Exam Vitals and nursing note reviewed.  Constitutional:      General: He is not in acute distress. HENT:     Head: Normocephalic and atraumatic.  Cardiovascular:     Rate and Rhythm: Normal rate and regular rhythm.     Heart sounds: Normal heart sounds.  Pulmonary:     Effort: Tachypnea present.     Breath sounds: Examination of the right-lower field reveals rales. Examination of the left-lower field reveals rales. Rales present.  Abdominal:     Palpations: Abdomen is soft.     Tenderness: There is no abdominal tenderness.  Musculoskeletal:     Right lower leg: Edema present.     Left lower leg: Edema present.  Neurological:     Mental Status: Mental status is at baseline.     Labs on Admission: I have personally reviewed following labs and imaging studies  CBC: Recent Labs  Lab 09/08/22 2119  WBC 19.6*  NEUTROABS 17.0*  HGB 14.3  HCT 43.4  MCV 92.5  PLT 623*   Basic Metabolic Panel: Recent Labs  Lab 09/08/22 2119  NA 140  K 3.1*  CL 101  CO2 27  GLUCOSE 136*   BUN 17  CREATININE 0.97  CALCIUM 8.6*   GFR: Estimated Creatinine Clearance: 54.4 mL/min (by C-G formula based on SCr of 0.97 mg/dL). Liver Function Tests: No results for input(s): "AST", "ALT", "ALKPHOS", "BILITOT", "PROT", "ALBUMIN" in the last 168 hours. No results for input(s): "LIPASE", "AMYLASE" in the last 168 hours. No results for input(s): "AMMONIA" in the last 168 hours. Coagulation Profile: No results for input(s): "INR", "PROTIME" in the last 168 hours. Cardiac Enzymes: No results for input(s): "CKTOTAL", "CKMB", "CKMBINDEX", "TROPONINI" in the last 168 hours. BNP (last 3 results) No results for input(s): "PROBNP" in the last 8760 hours. HbA1C: No results for input(s): "HGBA1C" in the last 72 hours. CBG:  No results for input(s): "GLUCAP" in the last 168 hours. Lipid Profile: No results for input(s): "CHOL", "HDL", "LDLCALC", "TRIG", "CHOLHDL", "LDLDIRECT" in the last 72 hours. Thyroid Function Tests: No results for input(s): "TSH", "T4TOTAL", "FREET4", "T3FREE", "THYROIDAB" in the last 72 hours. Anemia Panel: No results for input(s): "VITAMINB12", "FOLATE", "FERRITIN", "TIBC", "IRON", "RETICCTPCT" in the last 72 hours. Urine analysis:    Component Value Date/Time   COLORURINE AMBER (A) 12/14/2017 1133   APPEARANCEUR CLEAR (A) 12/14/2017 1133   APPEARANCEUR Clear 02/05/2014 1023   LABSPEC 1.026 12/14/2017 1133   LABSPEC 1.015 02/05/2014 1023   PHURINE 5.0 12/14/2017 1133   GLUCOSEU >=500 (A) 12/14/2017 1133   GLUCOSEU Negative 02/05/2014 1023   HGBUR NEGATIVE 12/14/2017 1133   BILIRUBINUR NEGATIVE 12/14/2017 1133   BILIRUBINUR 1+ 02/05/2014 1023   KETONESUR NEGATIVE 12/14/2017 1133   PROTEINUR 30 (A) 12/14/2017 1133   NITRITE NEGATIVE 12/14/2017 1133   LEUKOCYTESUR NEGATIVE 12/14/2017 1133   LEUKOCYTESUR Negative 02/05/2014 1023    Radiological Exams on Admission: CT Angio Chest PE W and/or Wo Contrast  Result Date: 09/08/2022 CLINICAL DATA:  Shortness  of breath EXAM: CT ANGIOGRAPHY CHEST WITH CONTRAST TECHNIQUE: Multidetector CT imaging of the chest was performed using the standard protocol during bolus administration of intravenous contrast. Multiplanar CT image reconstructions and MIPs were obtained to evaluate the vascular anatomy. RADIATION DOSE REDUCTION: This exam was performed according to the departmental dose-optimization program which includes automated exposure control, adjustment of the mA and/or kV according to patient size and/or use of iterative reconstruction technique. CONTRAST:  65m OMNIPAQUE IOHEXOL 350 MG/ML SOLN COMPARISON:  Chest x-ray 09/08/2022, chest CT 04/26/2022 FINDINGS: Cardiovascular: Satisfactory opacification of the pulmonary arteries to the segmental level. No evidence of pulmonary embolism. Moderate aortic atherosclerosis. Aneurysmal dilatation of the ascending aorta wall up to 4.4 cm. Post sternotomy changes. Coronary vascular calcification. Cardiomegaly. No pericardial effusion Mediastinum/Nodes: Midline trachea. No thyroid mass. Esophagus within normal limits. Multiple mildly enlarged mediastinal lymph nodes. Prevascular node measures 8 mm. Right paratracheal node measures 14 mm. Left paratracheal lymph node measures 11 mm. Subcarinal lymph node measures 20 mm. Lungs/Pleura: Small bilateral pleural effusions. Benign calcified right upper lobe pulmonary nodule. Multifocal areas of tree-in-bud density within the bilateral lungs with small areas of heterogeneous ground-glass density most evident in the lower lobes. Upper Abdomen: No acute abnormality. Musculoskeletal: No chest wall abnormality. No acute or significant osseous findings. Review of the MIP images confirms the above findings. IMPRESSION: 1. Negative for acute pulmonary embolus. 2. Cardiomegaly with small bilateral pleural effusions. 3. Multifocal bilateral areas of tree-in-bud density and heterogeneous ground-glass densities consistent with multifocal  infection/pneumonia, to include atypical organisms. 4. Mild mediastinal adenopathy is probably reactive 5. Ascending aortic aneurysm up to 4.4 cm. Recommend annual imaging followup by CTA or MRA. This recommendation follows 2010 ACCF/AHA/AATS/ACR/ASA/SCA/SCAI/SIR/STS/SVM Guidelines for the Diagnosis and Management of Patients with Thoracic Aortic Disease. Circulation. 2010; 121:: [id number] Aortic aneurysm NOS (ICD10-I71.9) Aortic Atherosclerosis (ICD10-I70.0). Electronically Signed   By: KDonavan FoilM.D.   On: 09/08/2022 23:12   DG Chest Portable 1 View  Result Date: 09/08/2022 CLINICAL DATA:  Shortness of breath EXAM: PORTABLE CHEST 1 VIEW COMPARISON:  CT 04/26/2022, radiograph 12/11/2018, 12/14/2017 FINDINGS: Cardiomegaly with central vascular congestion. Aortic atherosclerosis. Postsurgical changes of the mediastinum. No pleural effusion or pneumothorax. IMPRESSION: Cardiomegaly with mild central congestion. Electronically Signed   By: KDonavan FoilM.D.   On: 09/08/2022 21:45     Data Reviewed: Relevant notes from primary care  and specialist visits, past discharge summaries as available in EHR, including Care Everywhere. Prior diagnostic testing as pertinent to current admission diagnoses Updated medications and problem lists for reconciliation ED course, including vitals, labs, imaging, treatment and response to treatment Triage notes, nursing and pharmacy notes and ED provider's notes Notable results as noted in HPI   Assessment and Plan: * Acute on chronic diastolic CHF (congestive heart failure) (HCC) IV Lasix.  Patient not currently on ACE, ARB or beta-blockers for unclear reasons Daily weights with intake and output monitoring Last echo from June with EF over 55%  Multifocal pneumonia Rocephin and azithromycin DuoNebs as needed, antitussives Supplemental oxygen if needed Follow blood cultures  Elevated troponin CAD with history of CABG Troponin 33-1 85, suspect secondary to  demand from anemia and CHF Patient denies chest pain and EKG nonacute Continue aspirin and atorvastatin.  Not on metoprolol for unclear reason  Hypertensive urgency Continue amlodipine   Paroxysmal atrial fibrillation (HCC) Continue apixaban.  Not currently on rate control agents  COPD (chronic obstructive pulmonary disease) (HCC) Not currently exacerbated.  DuoNebs as needed        DVT prophylaxis: Apixaban  Consults: none  Advance Care Planning:   Code Status: Prior   Family Communication: Wife and daughter at bedside  Disposition Plan: Back to previous home environment  Severity of Illness: The appropriate patient status for this patient is INPATIENT. Inpatient status is judged to be reasonable and necessary in order to provide the required intensity of service to ensure the patient's safety. The patient's presenting symptoms, physical exam findings, and initial radiographic and laboratory data in the context of their chronic comorbidities is felt to place them at high risk for further clinical deterioration. Furthermore, it is not anticipated that the patient will be medically stable for discharge from the hospital within 2 midnights of admission.   * I certify that at the point of admission it is my clinical judgment that the patient will require inpatient hospital care spanning beyond 2 midnights from the point of admission due to high intensity of service, high risk for further deterioration and high frequency of surveillance required.*  Author: Athena Masse, MD 09/09/2022 12:10 AM  For on call review [email or web address].

## 2022-09-09 NOTE — ED Notes (Signed)
Multiple attempts made to place external catheter.  Patient continues to move around and male pure wick will not stay on.

## 2022-09-09 NOTE — ED Notes (Signed)
Patient requesting to assistance to have a BM.  Patient assisted to bedside commode.  Steady gait noted.  Minimal assistance needed.

## 2022-09-10 ENCOUNTER — Encounter: Payer: Self-pay | Admitting: Internal Medicine

## 2022-09-10 ENCOUNTER — Inpatient Hospital Stay
Admit: 2022-09-10 | Discharge: 2022-09-10 | Disposition: A | Discharge status: Home / Self Care | Payer: Medicare HMO | Attending: Cardiovascular Disease | Admitting: Cardiovascular Disease

## 2022-09-10 DIAGNOSIS — I5033 Acute on chronic diastolic (congestive) heart failure: Secondary | ICD-10-CM | POA: Diagnosis not present

## 2022-09-10 LAB — ECHOCARDIOGRAM COMPLETE
AR max vel: 2.61 cm2
AV Area VTI: 2.59 cm2
AV Area mean vel: 2.46 cm2
AV Mean grad: 5 mmHg
AV Peak grad: 9 mmHg
Ao pk vel: 1.5 m/s
Area-P 1/2: 3.56 cm2
Height: 67 in
S' Lateral: 3.3 cm
Weight: 3488 oz

## 2022-09-10 LAB — BASIC METABOLIC PANEL
Anion gap: 11 (ref 5–15)
BUN: 24 mg/dL — ABNORMAL HIGH (ref 8–23)
CO2: 32 mmol/L (ref 22–32)
Calcium: 8.6 mg/dL — ABNORMAL LOW (ref 8.9–10.3)
Chloride: 99 mmol/L (ref 98–111)
Creatinine, Ser: 1.07 mg/dL (ref 0.61–1.24)
GFR, Estimated: >60 mL/min (ref >60)
Glucose, Bld: 105 mg/dL — ABNORMAL HIGH (ref 70–99)
Potassium: 2.8 mmol/L — ABNORMAL LOW (ref 3.5–5.1)
Sodium: 142 mmol/L (ref 135–145)

## 2022-09-10 LAB — CBC
HCT: 41.4 % (ref 39.0–52.0)
Hemoglobin: 13.8 g/dL (ref 13.0–17.0)
MCH: 30.1 pg (ref 26.0–34.0)
MCHC: 33.3 g/dL (ref 30.0–36.0)
MCV: 90.2 fL (ref 80.0–100.0)
Platelets: 591 10*3/uL — ABNORMAL HIGH (ref 150–400)
RBC: 4.59 MIL/uL (ref 4.22–5.81)
RDW: 15.9 % — ABNORMAL HIGH (ref 11.5–15.5)
WBC: 17.2 10*3/uL — ABNORMAL HIGH (ref 4.0–10.5)
nRBC: 0 % (ref 0.0–0.2)

## 2022-09-10 LAB — PHOSPHORUS: Phosphorus: 3.7 mg/dL (ref 2.5–4.6)

## 2022-09-10 LAB — TROPONIN I (HIGH SENSITIVITY): Troponin I (High Sensitivity): 235 ng/L (ref <18)

## 2022-09-10 LAB — MAGNESIUM: Magnesium: 1.9 mg/dL (ref 1.7–2.4)

## 2022-09-10 MED ORDER — LOSARTAN POTASSIUM 50 MG PO TABS
50.0000 mg | ORAL_TABLET | Freq: Every day | ORAL | Status: DC
  Administered 2022-09-10 – 2022-09-12 (×3): 50 mg via ORAL
  Filled 2022-09-10 (×3): qty 1

## 2022-09-10 MED ORDER — POTASSIUM CHLORIDE 20 MEQ PO PACK
40.0000 meq | PACK | Freq: Once | ORAL | Status: AC
  Administered 2022-09-10: 40 meq via ORAL
  Filled 2022-09-10: qty 2

## 2022-09-10 MED ORDER — DAPAGLIFLOZIN PROPANEDIOL 10 MG PO TABS
10.0000 mg | ORAL_TABLET | Freq: Every day | ORAL | Status: DC
  Administered 2022-09-10 – 2022-09-12 (×3): 10 mg via ORAL
  Filled 2022-09-10 (×4): qty 1

## 2022-09-10 MED ORDER — INFLUENZA VAC A&B SA ADJ QUAD 0.5 ML IM PRSY: 0.5000 mL | PREFILLED_SYRINGE | INTRAMUSCULAR | Status: DC | PRN

## 2022-09-10 NOTE — Progress Notes (Signed)
*  PRELIMINARY RESULTS* Echocardiogram 2D Echocardiogram has been performed.  David Hardin David Hardin David Hardin David Hardin 09/10/2022, 1:30 PM

## 2022-09-10 NOTE — Progress Notes (Signed)
PROGRESS NOTE    David Hardin  LKT:[id number] DOB: 20-Sep-1927 DOA: 09/08/2022 PCP: Derinda Late, MD   Brief Narrative: This 88 years old male with PMH significant for paroxysmal A-fib on Eliquis, CAD s/p CABG, hypertension, hyperlipidemia, diastolic CHF with recent LVEF of 55% on echo on 05/12/2022, recently seen by his cardiologist 2 days ago with lower extremity edema and 7 pound weight gain.  His Lasix dose was increased.  Patient reports no improvement , So he presented in the ED.  Work-up in the ED reveals blood pressure 213/73,  troponin 33> 187, BNP 545,  WBC 19 K, lactic acid 1.6.  CTA chest ruled out pulmonary embolism but shows cardiomegaly with findings consistent with CHF and multifocal pneumonia.  Patient was given Lasix 60 mg and started on azithromycin and Rocephin for community-acquired pneumonia.  Elevated troponin could be due to demand ischemia in the setting of multifocal pneumonia and acute on chronic CHF.  Heart rate remains reasonably controlled.  Continue home medications.  Assessment & Plan:   Principal Problem:   Acute on chronic diastolic CHF (congestive heart failure) (HCC) Active Problems:   Multifocal pneumonia   Paroxysmal atrial fibrillation (HCC)   Hypertensive urgency   Elevated troponin   Chronic anticoagulation   COPD (chronic obstructive pulmonary disease) (HCC)   CAD with Hx of CABG   Hypertension  Acute on chronic diastolic CHF: Patient presented with worsening shortness of breath, weight gain and lower extremity edema. He has recently seen his PCP and his Lasix dose was increased.  Patient did not see any improvement. Continue IV Lasix 40 mg every 12 hours. Monitor intake output charting, daily weight Echo from June shows LVEF 55%. Cardiology is consulted recommended repeat echocardiogram.   Intake/Output Summary (Last 24 hours) at 09/10/2022 1504 Last data filed at 09/10/2022 1408 Gross per 24 hour  Intake 710 ml  Output 3750 ml  Net  -3040 ml    Multifocal pneumonia: CT chest confirms multifocal pneumonia. Continue empiric antibiotics (Rocephin and azithromycin). Continue DuoNebs as needed, antitussives Continue supplemental oxygen if needed Follow blood cultures   Elevated troponins Suspect demand ischemia in the setting of CHF and pneumonia. Troponin 33> 185, denies any chest pain, EKG nonacute. Continue aspirin and Lipitor.  Not on metoprolol for unclear reasons  Essential hypertension: Continue amlodipine. Started on losartan 50 mg dialy    Paroxysmal atrial fibrillation: Heart rate is reasonably controlled.  Continue Eliquis. Not on rate controlling medications.   COPD (chronic obstructive pulmonary disease) Not currently exacerbated.  DuoNebs as needed    Obesity: Estimated body mass index is 34.14 kg/m as calculated from the following:   Height as of this encounter: '5\' 7"'$  (1.702 m).   Weight as of this encounter: 98.9 kg.  Diet and exercise discussed in detail.  DVT prophylaxis:Eliquis Code Status: Full code Family Communication: No family at bed side Disposition Plan:   Status is: Inpatient Remains inpatient appropriate because: Admitted for community-acquired pneumonia requiring IV antibiotics and decompensated heart failure requiring IV diuresis.  Cardiology is consulted, advised to continue diuresis     Consultants:  Cardiology  Procedures: Echocardiogram Antimicrobials:  Anti-infectives (From admission, onward)    Start     Dose/Rate Route Frequency Ordered Stop   09/10/22 0000  cefTRIAXone (ROCEPHIN) 2 g in sodium chloride 0.9 % 100 mL IVPB        2 g 200 mL/hr over 30 Minutes Intravenous Every 24 hours 09/09/22 0047 09/13/22 2359   09/09/22 0100  azithromycin (ZITHROMAX) 500 mg in sodium chloride 0.9 % 250 mL IVPB        500 mg 250 mL/hr over 60 Minutes Intravenous Every 24 hours 09/09/22 0047 09/14/22 0059   09/08/22 2330  cefTRIAXone (ROCEPHIN) 1 g in sodium chloride 0.9 %  100 mL IVPB        1 g 200 mL/hr over 30 Minutes Intravenous  Once 09/08/22 2325 09/09/22 0112   09/08/22 2330  azithromycin (ZITHROMAX) 500 mg in sodium chloride 0.9 % 250 mL IVPB  Status:  Discontinued        500 mg 250 mL/hr over 60 Minutes Intravenous  Once 09/08/22 2325 09/09/22 0054       Subjective: Patient was seen and examined at bedside.  Overnight events noted.   Patient reports feeling improved. 3 L neg. Balance .He states that he feels much improved. Leg swelling is improving  Objective: Vitals:   09/09/22 2332 09/10/22 0414 09/10/22 0756 09/10/22 1152  BP: (!) 152/63 (!) 165/69 (!) 169/64 (!) 132/49  Pulse: (!) 57 65 (!) 55 (!) 51  Resp: '17 16 18 18  '$ Temp: 98.5 F (36.9 C) 98.2 F (36.8 C) 98.1 F (36.7 C) (!) 97.4 F (36.3 C)  TempSrc:      SpO2: 93% 96% 95% 97%  Weight:      Height:        Intake/Output Summary (Last 24 hours) at 09/10/2022 1500 Last data filed at 09/10/2022 1408 Gross per 24 hour  Intake 710 ml  Output 3750 ml  Net -3040 ml   Filed Weights   09/08/22 2110  Weight: 98.9 kg    Examination:  General exam: Appears comfortable, not in any acute distress.  Deconditioned Respiratory system: CTA bilaterally, no wheezing, no crackles, normal respiratory effort. Cardiovascular system: S1 & S2 heard, irregular rhythm, no murmur. Gastrointestinal system: Abdomen is soft, non tender, non distended, BS+ Central nervous system: Alert and oriented x3. No focal neurological deficits. Extremities: Edema+, no cyanosis, no clubbing Skin: No rashes, lesions or ulcers Psychiatry: Judgement and insight appear normal. Mood & affect appropriate.     Data Reviewed: I have personally reviewed following labs and imaging studies  CBC: Recent Labs  Lab 09/08/22 2119 09/10/22 0436  WBC 19.6* 17.2*  NEUTROABS 17.0*  --   HGB 14.3 13.8  HCT 43.4 41.4  MCV 92.5 90.2  PLT 672* 326*   Basic Metabolic Panel: Recent Labs  Lab 09/08/22 2119  09/10/22 0436  NA 140 142  K 3.1* 2.8*  CL 101 99  CO2 27 32  GLUCOSE 136* 105*  BUN 17 24*  CREATININE 0.97 1.07  CALCIUM 8.6* 8.6*  MG  --  1.9  PHOS  --  3.7   GFR: Estimated Creatinine Clearance: 49.3 mL/min (by C-G formula based on SCr of 1.07 mg/dL). Liver Function Tests: No results for input(s): "AST", "ALT", "ALKPHOS", "BILITOT", "PROT", "ALBUMIN" in the last 168 hours. No results for input(s): "LIPASE", "AMYLASE" in the last 168 hours. No results for input(s): "AMMONIA" in the last 168 hours. Coagulation Profile: No results for input(s): "INR", "PROTIME" in the last 168 hours. Cardiac Enzymes: No results for input(s): "CKTOTAL", "CKMB", "CKMBINDEX", "TROPONINI" in the last 168 hours. BNP (last 3 results) No results for input(s): "PROBNP" in the last 8760 hours. HbA1C: No results for input(s): "HGBA1C" in the last 72 hours. CBG: No results for input(s): "GLUCAP" in the last 168 hours. Lipid Profile: No results for input(s): "CHOL", "HDL", "LDLCALC", "TRIG", "CHOLHDL", "  LDLDIRECT" in the last 72 hours. Thyroid Function Tests: No results for input(s): "TSH", "T4TOTAL", "FREET4", "T3FREE", "THYROIDAB" in the last 72 hours. Anemia Panel: No results for input(s): "VITAMINB12", "FOLATE", "FERRITIN", "TIBC", "IRON", "RETICCTPCT" in the last 72 hours. Sepsis Labs: Recent Labs  Lab 09/09/22 0003  LATICACIDVEN 1.6    Recent Results (from the past 240 hour(s))  Blood culture (routine x 2)     Status: None (Preliminary result)   Collection Time: 09/08/22 11:50 PM   Specimen: BLOOD  Result Value Ref Range Status   Specimen Description BLOOD BLOOD RIGHT HAND  Final   Special Requests   Final    BOTTLES DRAWN AEROBIC AND ANAEROBIC Blood Culture results may not be optimal due to an excessive volume of blood received in culture bottles   Culture   Final    NO GROWTH 1 DAY Performed at [facility], [address], [place], [place] [address]    Report Status  PENDING  Incomplete  Resp Panel by RT-PCR (Flu A&B, Covid) Anterior Nasal Swab     Status: None   Collection Time: 09/09/22 12:03 AM   Specimen: Anterior Nasal Swab  Result Value Ref Range Status   SARS Coronavirus 2 by RT PCR NEGATIVE NEGATIVE Final    Comment: (NOTE) SARS-CoV-2 target nucleic acids are NOT DETECTED.  The SARS-CoV-2 RNA is generally detectable in upper respiratory specimens during the acute phase of infection. The lowest concentration of SARS-CoV-2 viral copies this assay can detect is 138 copies/mL. A negative result does not preclude SARS-Cov-2 infection and should not be used as the sole basis for treatment or other patient management decisions. A negative result may occur with  improper specimen collection/handling, submission of specimen other than nasopharyngeal swab, presence of viral mutation(s) within the areas targeted by this assay, and inadequate number of viral copies(<138 copies/mL). A negative result must be combined with clinical observations, patient history, and epidemiological information. The expected result is Negative.  Fact Sheet for Patients:  EntrepreneurPulse.com.au  Fact Sheet for Healthcare Providers:  IncredibleEmployment.be  This test is no t yet approved or cleared by the [place] FDA and  has been authorized for detection and/or diagnosis of SARS-CoV-2 by FDA under an Emergency Use Authorization (EUA). This EUA will remain  in effect (meaning this test can be used) for the duration of the COVID-19 declaration under Section 564(b)(1) of the Act, 21 U.S.C.section 360bbb-3(b)(1), unless the authorization is terminated  or revoked sooner.       Influenza A by PCR NEGATIVE NEGATIVE Final   Influenza B by PCR NEGATIVE NEGATIVE Final    Comment: (NOTE) The Xpert Xpress SARS-CoV-2/FLU/RSV plus assay is intended as an aid in the diagnosis of influenza from Nasopharyngeal swab specimens  and should not be used as a sole basis for treatment. Nasal washings and aspirates are unacceptable for Xpert Xpress SARS-CoV-2/FLU/RSV testing.  Fact Sheet for Patients: EntrepreneurPulse.com.au  Fact Sheet for Healthcare Providers: IncredibleEmployment.be  This test is not yet approved or cleared by the [place] FDA and has been authorized for detection and/or diagnosis of SARS-CoV-2 by FDA under an Emergency Use Authorization (EUA). This EUA will remain in effect (meaning this test can be used) for the duration of the COVID-19 declaration under Section 564(b)(1) of the Act, 21 U.S.C. section 360bbb-3(b)(1), unless the authorization is terminated or revoked.  Performed at [facility], [address], [place], [place] [address]   Blood culture (routine x 2)     Status:  None (Preliminary result)   Collection Time: 09/09/22 12:03 AM   Specimen: BLOOD  Result Value Ref Range Status   Specimen Description BLOOD RIGHT ANTECUBITAL  Final   Special Requests   Final    BOTTLES DRAWN AEROBIC AND ANAEROBIC Blood Culture results may not be optimal due to an excessive volume of blood received in culture bottles   Culture   Final    NO GROWTH 1 DAY Performed at [facility], [address] [place], [place] [address]    Report Status PENDING  Incomplete     Radiology Studies: CT Angio Chest PE W and/or Wo Contrast  Result Date: 09/08/2022 CLINICAL DATA:  Shortness of breath EXAM: CT ANGIOGRAPHY CHEST WITH CONTRAST TECHNIQUE: Multidetector CT imaging of the chest was performed using the standard protocol during bolus administration of intravenous contrast. Multiplanar CT image reconstructions and MIPs were obtained to evaluate the vascular anatomy. RADIATION DOSE REDUCTION: This exam was performed according to the departmental dose-optimization program which includes automated exposure control, adjustment of the mA and/or kV  according to patient size and/or use of iterative reconstruction technique. CONTRAST:  89m OMNIPAQUE IOHEXOL 350 MG/ML SOLN COMPARISON:  Chest x-ray 09/08/2022, chest CT 04/26/2022 FINDINGS: Cardiovascular: Satisfactory opacification of the pulmonary arteries to the segmental level. No evidence of pulmonary embolism. Moderate aortic atherosclerosis. Aneurysmal dilatation of the ascending aorta wall up to 4.4 cm. Post sternotomy changes. Coronary vascular calcification. Cardiomegaly. No pericardial effusion Mediastinum/Nodes: Midline trachea. No thyroid mass. Esophagus within normal limits. Multiple mildly enlarged mediastinal lymph nodes. Prevascular node measures 8 mm. Right paratracheal node measures 14 mm. Left paratracheal lymph node measures 11 mm. Subcarinal lymph node measures 20 mm. Lungs/Pleura: Small bilateral pleural effusions. Benign calcified right upper lobe pulmonary nodule. Multifocal areas of tree-in-bud density within the bilateral lungs with small areas of heterogeneous ground-glass density most evident in the lower lobes. Upper Abdomen: No acute abnormality. Musculoskeletal: No chest wall abnormality. No acute or significant osseous findings. Review of the MIP images confirms the above findings. IMPRESSION: 1. Negative for acute pulmonary embolus. 2. Cardiomegaly with small bilateral pleural effusions. 3. Multifocal bilateral areas of tree-in-bud density and heterogeneous ground-glass densities consistent with multifocal infection/pneumonia, to include atypical organisms. 4. Mild mediastinal adenopathy is probably reactive 5. Ascending aortic aneurysm up to 4.4 cm. Recommend annual imaging followup by CTA or MRA. This recommendation follows 2010 ACCF/AHA/AATS/ACR/ASA/SCA/SCAI/SIR/STS/SVM Guidelines for the Diagnosis and Management of Patients with Thoracic Aortic Disease. Circulation. 2010; 121:: [id number] Aortic aneurysm NOS (ICD10-I71.9) Aortic Atherosclerosis (ICD10-I70.0). Electronically  Signed   By: KDonavan FoilM.D.   On: 09/08/2022 23:12   DG Chest Portable 1 View  Result Date: 09/08/2022 CLINICAL DATA:  Shortness of breath EXAM: PORTABLE CHEST 1 VIEW COMPARISON:  CT 04/26/2022, radiograph 12/11/2018, 12/14/2017 FINDINGS: Cardiomegaly with central vascular congestion. Aortic atherosclerosis. Postsurgical changes of the mediastinum. No pleural effusion or pneumothorax. IMPRESSION: Cardiomegaly with mild central congestion. Electronically Signed   By: KDonavan FoilM.D.   On: 09/08/2022 21:45     Scheduled Meds:  amLODipine  5 mg Oral Daily   apixaban  5 mg Oral Q12H   aspirin  81 mg Oral Daily   atorvastatin  20 mg Oral Daily   dapagliflozin propanediol  10 mg Oral Daily   fluticasone furoate-vilanterol  1 puff Inhalation Daily   furosemide  40 mg Intravenous BID   losartan  50 mg Oral Daily   melatonin  5 mg Oral QHS   potassium chloride  40 mEq  Oral Daily   Continuous Infusions:  azithromycin 500 mg (09/10/22 0143)   cefTRIAXone (ROCEPHIN)  IV 2 g (09/09/22 2328)     LOS: 2 days    Time spent: 106 mins    David Abdallah, MD [facility]   If 7PM-7AM, please contact night-coverage

## 2022-09-10 NOTE — Plan of Care (Signed)
°  Problem: Education: °Goal: Ability to demonstrate management of disease process will improve °Outcome: Progressing °Goal: Ability to verbalize understanding of medication therapies will improve °Outcome: Progressing °Goal: Individualized Educational Video(s) °Outcome: Progressing °  °Problem: Activity: °Goal: Capacity to carry out activities will improve °Outcome: Progressing °  °Problem: Cardiac: °Goal: Ability to achieve and maintain adequate cardiopulmonary perfusion will improve °Outcome: Progressing °  °Problem: Activity: °Goal: Ability to tolerate increased activity will improve °Outcome: Progressing °  °Problem: Clinical Measurements: °Goal: Ability to maintain a body temperature in the normal range will improve °Outcome: Progressing °  °Problem: Respiratory: °Goal: Ability to maintain adequate ventilation will improve °Outcome: Progressing °Goal: Ability to maintain a clear airway will improve °Outcome: Progressing °  °Problem: Education: °Goal: Knowledge of General Education information will improve °Description: Including pain rating scale, medication(s)/side effects and non-pharmacologic comfort measures °Outcome: Progressing °  °Problem: Health Behavior/Discharge Planning: °Goal: Ability to manage health-related needs will improve °Outcome: Progressing °  °Problem: Clinical Measurements: °Goal: Ability to maintain clinical measurements within normal limits will improve °Outcome: Progressing °Goal: Will remain free from infection °Outcome: Progressing °Goal: Diagnostic test results will improve °Outcome: Progressing °Goal: Respiratory complications will improve °Outcome: Progressing °Goal: Cardiovascular complication will be avoided °Outcome: Progressing °  °Problem: Activity: °Goal: Risk for activity intolerance will decrease °Outcome: Progressing °  °Problem: Nutrition: °Goal: Adequate nutrition will be maintained °Outcome: Progressing °  °Problem: Coping: °Goal: Level of anxiety will  decrease °Outcome: Progressing °  °Problem: Elimination: °Goal: Will not experience complications related to bowel motility °Outcome: Progressing °Goal: Will not experience complications related to urinary retention °Outcome: Progressing °  °Problem: Pain Managment: °Goal: General experience of comfort will improve °Outcome: Progressing °  °Problem: Safety: °Goal: Ability to remain free from injury will improve °Outcome: Progressing °  °Problem: Skin Integrity: °Goal: Risk for impaired skin integrity will decrease °Outcome: Progressing °  °

## 2022-09-10 NOTE — Consult Note (Signed)
David Hardin is a 88 y.o. male  [id number]  Primary Cardiologist: Dulce Sellar Reason for Consultation: Congestive heart failure  HPI: Is a 88 year old white male with past medical history of atrial fibrillation hypertension and congestive heart failure presented to the hospital with acute onset of shortness of breath orthopnea PND and leg swelling.  Patient was seen at [facility] clinic last Thursday and his diuretics were adjusted but apparently got worse.   Review of Systems: No chest pain   Past Medical History:  Diagnosis Date   Atrial fibrillation (HCC)    COPD (chronic obstructive pulmonary disease) (HCC)    Coronary artery disease    Hyperlipidemia    Hypertension    Squamous cell carcinoma of skin 11/21/2021   L dorsum hand - ED&C   Squamous cell carcinoma of skin 11/21/2021   L dorsum forearm - ED&C    Medications Prior to Admission  Medication Sig Dispense Refill   albuterol (VENTOLIN HFA) 108 (90 Base) MCG/ACT inhaler Inhale 2 puffs into the lungs every 6 (six) hours as needed.     amLODipine (NORVASC) 5 MG tablet Take 5 mg by mouth daily.     apixaban (ELIQUIS) 5 MG TABS tablet Take 5 mg by mouth every 12 (twelve) hours.     aspirin 81 MG chewable tablet Chew 81 mg by mouth daily.     atorvastatin (LIPITOR) 10 MG tablet Take 10 mg by mouth daily.     furosemide (LASIX) 40 MG tablet Take 40 mg by mouth daily.     ipratropium-albuterol (DUONEB) 0.5-2.5 (3) MG/3ML SOLN Take 3 mLs by nebulization every 6 (six) hours as needed (shortness of breath, wheezing.). 360 mL 0   metolazone (ZAROXOLYN) 5 MG tablet Take 5 mg by mouth daily. For 3 days starting 09-08-22.     montelukast (SINGULAIR) 10 MG tablet Take 10 mg by mouth at bedtime.     Multiple Vitamins-Minerals (PRESERVISION AREDS) CAPS Take 1 capsule by mouth 2 (two) times daily.     sildenafil (REVATIO) 20 MG tablet      tamsulosin (FLOMAX) 0.4 MG CAPS capsule Take 0.4 mg by mouth daily. Take 1 capsule (0.4 mg  total) by mouth once daily Take 30 minutes after same meal each day.     TRELEGY ELLIPTA 100-62.5-25 MCG/ACT AEPB Inhale 1 puff into the lungs daily.     atorvastatin (LIPITOR) 20 MG tablet Take 20 mg by mouth daily. (Patient not taking: Reported on 09/09/2022)     fluticasone (FLONASE) 50 MCG/ACT nasal spray Place 2 sprays into the nose daily as needed.     fluticasone furoate-vilanterol (BREO ELLIPTA) 100-25 MCG/INH AEPB Inhale 1 puff into the lungs daily. (Patient not taking: Reported on 09/09/2022)     methocarbamol (ROBAXIN) 500 MG tablet Take 500 mg by mouth 2 (two) times daily. (Patient not taking: Reported on 09/09/2022)     predniSONE (DELTASONE) 10 MG tablet Label  & dispense according to the schedule below. 5 Pills PO for 1 day then, 4 Pills PO for 1 day, 3 Pills PO for 1 day, 2 Pills PO for 1 day, 1 Pill PO for 1 days then STOP. (Patient not taking: Reported on 09/09/2022) 15 tablet 0      amLODipine  5 mg Oral Daily   apixaban  5 mg Oral Q12H   aspirin  81 mg Oral Daily   atorvastatin  20 mg Oral Daily   fluticasone furoate-vilanterol  1 puff Inhalation Daily   furosemide  40 mg Intravenous BID   melatonin  5 mg Oral QHS   potassium chloride  40 mEq Oral Daily    Infusions:  azithromycin 500 mg (09/10/22 0143)   cefTRIAXone (ROCEPHIN)  IV 2 g (09/09/22 2328)    No Known Allergies  Social History   Socioeconomic History   Marital status: Married    Spouse name: Not on file   Number of children: Not on file   Years of education: Not on file   Highest education level: Not on file  Occupational History   Occupation: retired  Tobacco Use   Smoking status: Former   Smokeless tobacco: Never  Scientific laboratory technician Use: Never used  Substance and Sexual Activity   Alcohol use: Yes   Drug use: No   Sexual activity: Never  Other Topics Concern   Not on file  Social History Narrative   Not on file   Social Determinants of Health   Financial Resource Strain: Not on file   Food Insecurity: No Food Insecurity (09/09/2022)   Hunger Vital Sign    Worried About Running Out of Food in the Last Year: Never true    [place]y in the Last Year: Never true  Transportation Needs: No Transportation Needs (09/09/2022)   PRAPARE - Hydrologist (Medical): No    Lack of Transportation (Non-Medical): No  Physical Activity: Not on file  Stress: Not on file  Social Connections: Not on file  Intimate Partner Violence: Not At Risk (09/09/2022)   Humiliation, Afraid, Rape, and Kick questionnaire    Fear of Current or Ex-Partner: No    Emotionally Abused: No    Physically Abused: No    Sexually Abused: No    Family History  Problem Relation Age of Onset   Breast cancer Mother    Heart attack Father     PHYSICAL EXAM: Vitals:   09/10/22 0756 09/10/22 1152  BP: (!) 169/64 (!) 132/49  Pulse: (!) 55 (!) 51  Resp: 18 18  Temp: 98.1 F (36.7 C) (!) 97.4 F (36.3 C)  SpO2: 95% 97%     Intake/Output Summary (Last 24 hours) at 09/10/2022 1200 Last data filed at 09/10/2022 1100 Gross per 24 hour  Intake 470 ml  Output 3750 ml  Net -3280 ml    General:  Well appearing. No respiratory difficulty HEENT: normal Neck: supple. no JVD. Carotids 2+ bilat; no bruits. No lymphadenopathy or thryomegaly appreciated. Cor: PMI nondisplaced. Regular rate & rhythm. No rubs, gallops or murmurs. Lungs: clear Abdomen: soft, nontender, nondistended. No hepatosplenomegaly. No bruits or masses. Good bowel sounds. Extremities: no cyanosis, clubbing, rash, edema Neuro: alert & oriented x 3, cranial nerves grossly intact. moves all 4 extremities w/o difficulty. Affect pleasant.  ECG: Atrial fibrillation with ventricular rate about 80/min with nonspecific ST-T changes  Results for orders placed or performed during the hospital encounter of 09/08/22 (from the past 24 hour(s))  CBC     Status: Abnormal   Collection Time: 09/10/22  4:36 AM  Result Value  Ref Range   WBC 17.2 (H) 4.0 - 10.5 K/uL   RBC 4.59 4.22 - 5.81 MIL/uL   Hemoglobin 13.8 13.0 - 17.0 g/dL   HCT 41.4 39.0 - 52.0 %   MCV 90.2 80.0 - 100.0 fL   MCH 30.1 26.0 - 34.0 pg   MCHC 33.3 30.0 - 36.0 g/dL   RDW 15.9 (H) 11.5 - 15.5 %   Platelets  591 (H) 150 - 400 K/uL   nRBC 0.0 0.0 - 0.2 %  Magnesium     Status: None   Collection Time: 09/10/22  4:36 AM  Result Value Ref Range   Magnesium 1.9 1.7 - 2.4 mg/dL  Basic metabolic panel     Status: Abnormal   Collection Time: 09/10/22  4:36 AM  Result Value Ref Range   Sodium 142 135 - 145 mmol/L   Potassium 2.8 (L) 3.5 - 5.1 mmol/L   Chloride 99 98 - 111 mmol/L   CO2 32 22 - 32 mmol/L   Glucose, Bld 105 (H) 70 - 99 mg/dL   BUN 24 (H) 8 - 23 mg/dL   Creatinine, Ser 1.07 0.61 - 1.24 mg/dL   Calcium 8.6 (L) 8.9 - 10.3 mg/dL   GFR, Estimated >60 >60 mL/min   Anion gap 11 5 - 15  Phosphorus     Status: None   Collection Time: 09/10/22  4:36 AM  Result Value Ref Range   Phosphorus 3.7 2.5 - 4.6 mg/dL  Troponin I (High Sensitivity)     Status: Abnormal   Collection Time: 09/10/22  9:45 AM  Result Value Ref Range   Troponin I (High Sensitivity) 235 (HH) <18 ng/L   CT Angio Chest PE W and/or Wo Contrast  Result Date: 09/08/2022 CLINICAL DATA:  Shortness of breath EXAM: CT ANGIOGRAPHY CHEST WITH CONTRAST TECHNIQUE: Multidetector CT imaging of the chest was performed using the standard protocol during bolus administration of intravenous contrast. Multiplanar CT image reconstructions and MIPs were obtained to evaluate the vascular anatomy. RADIATION DOSE REDUCTION: This exam was performed according to the departmental dose-optimization program which includes automated exposure control, adjustment of the mA and/or kV according to patient size and/or use of iterative reconstruction technique. CONTRAST:  2m OMNIPAQUE IOHEXOL 350 MG/ML SOLN COMPARISON:  Chest x-ray 09/08/2022, chest CT 04/26/2022 FINDINGS: Cardiovascular: Satisfactory  opacification of the pulmonary arteries to the segmental level. No evidence of pulmonary embolism. Moderate aortic atherosclerosis. Aneurysmal dilatation of the ascending aorta wall up to 4.4 cm. Post sternotomy changes. Coronary vascular calcification. Cardiomegaly. No pericardial effusion Mediastinum/Nodes: Midline trachea. No thyroid mass. Esophagus within normal limits. Multiple mildly enlarged mediastinal lymph nodes. Prevascular node measures 8 mm. Right paratracheal node measures 14 mm. Left paratracheal lymph node measures 11 mm. Subcarinal lymph node measures 20 mm. Lungs/Pleura: Small bilateral pleural effusions. Benign calcified right upper lobe pulmonary nodule. Multifocal areas of tree-in-bud density within the bilateral lungs with small areas of heterogeneous ground-glass density most evident in the lower lobes. Upper Abdomen: No acute abnormality. Musculoskeletal: No chest wall abnormality. No acute or significant osseous findings. Review of the MIP images confirms the above findings. IMPRESSION: 1. Negative for acute pulmonary embolus. 2. Cardiomegaly with small bilateral pleural effusions. 3. Multifocal bilateral areas of tree-in-bud density and heterogeneous ground-glass densities consistent with multifocal infection/pneumonia, to include atypical organisms. 4. Mild mediastinal adenopathy is probably reactive 5. Ascending aortic aneurysm up to 4.4 cm. Recommend annual imaging followup by CTA or MRA. This recommendation follows 2010 ACCF/AHA/AATS/ACR/ASA/SCA/SCAI/SIR/STS/SVM Guidelines for the Diagnosis and Management of Patients with Thoracic Aortic Disease. Circulation. 2010; 121:: O350-K938 Aortic aneurysm NOS (ICD10-I71.9) Aortic Atherosclerosis (ICD10-I70.0). Electronically Signed   By: KDonavan FoilM.D.   On: 09/08/2022 23:12   DG Chest Portable 1 View  Result Date: 09/08/2022 CLINICAL DATA:  Shortness of breath EXAM: PORTABLE CHEST 1 VIEW COMPARISON:  CT 04/26/2022, radiograph  12/11/2018, 12/14/2017 FINDINGS: Cardiomegaly with central vascular congestion. Aortic atherosclerosis. Postsurgical  changes of the mediastinum. No pleural effusion or pneumothorax. IMPRESSION: Cardiomegaly with mild central congestion. Electronically Signed   By: Donavan Foil M.D.   On: 09/08/2022 21:45     ASSESSMENT AND PLAN: CHF/atrial fibrillation with controlled ventricular rate.  Patient had decompensated heart failure requiring IV Lasix with significant improvement in symptoms.   Continue IV Lasix.  Will add losartan as has uncontrolled hypertension.  May also consider Farxiga especially if patient has HFrEF.  Echocardiogram to evaluate LV function.  Tejah Brekke A

## 2022-09-11 DIAGNOSIS — I5033 Acute on chronic diastolic (congestive) heart failure: Secondary | ICD-10-CM | POA: Diagnosis not present

## 2022-09-11 LAB — BASIC METABOLIC PANEL
Anion gap: 11 (ref 5–15)
BUN: 24 mg/dL — ABNORMAL HIGH (ref 8–23)
CO2: 30 mmol/L (ref 22–32)
Calcium: 8.5 mg/dL — ABNORMAL LOW (ref 8.9–10.3)
Chloride: 98 mmol/L (ref 98–111)
Creatinine, Ser: 1.06 mg/dL (ref 0.61–1.24)
GFR, Estimated: >60 mL/min (ref >60)
Glucose, Bld: 91 mg/dL (ref 70–99)
Potassium: 3.4 mmol/L — ABNORMAL LOW (ref 3.5–5.1)
Sodium: 139 mmol/L (ref 135–145)

## 2022-09-11 LAB — PHOSPHORUS: Phosphorus: 4.1 mg/dL (ref 2.5–4.6)

## 2022-09-11 LAB — MAGNESIUM: Magnesium: 2 mg/dL (ref 1.7–2.4)

## 2022-09-11 LAB — CBC
HCT: 42.9 % (ref 39.0–52.0)
Hemoglobin: 14.3 g/dL (ref 13.0–17.0)
MCH: 30.7 pg (ref 26.0–34.0)
MCHC: 33.3 g/dL (ref 30.0–36.0)
MCV: 92.1 fL (ref 80.0–100.0)
Platelets: 548 10*3/uL — ABNORMAL HIGH (ref 150–400)
RBC: 4.66 MIL/uL (ref 4.22–5.81)
RDW: 15.8 % — ABNORMAL HIGH (ref 11.5–15.5)
WBC: 14.6 10*3/uL — ABNORMAL HIGH (ref 4.0–10.5)
nRBC: 0 % (ref 0.0–0.2)

## 2022-09-11 MED ORDER — POTASSIUM CHLORIDE 20 MEQ PO PACK
40.0000 meq | PACK | Freq: Once | ORAL | Status: AC
  Administered 2022-09-11: 40 meq via ORAL
  Filled 2022-09-11: qty 2

## 2022-09-11 MED ORDER — CHLORHEXIDINE GLUCONATE CLOTH 2 % EX PADS
6.0000 | MEDICATED_PAD | Freq: Every day | CUTANEOUS | Status: DC
  Administered 2022-09-12: 6 via TOPICAL

## 2022-09-11 NOTE — Plan of Care (Signed)
°  Problem: Education: °Goal: Ability to demonstrate management of disease process will improve °Outcome: Progressing °Goal: Ability to verbalize understanding of medication therapies will improve °Outcome: Progressing °Goal: Individualized Educational Video(s) °Outcome: Progressing °  °Problem: Activity: °Goal: Capacity to carry out activities will improve °Outcome: Progressing °  °Problem: Cardiac: °Goal: Ability to achieve and maintain adequate cardiopulmonary perfusion will improve °Outcome: Progressing °  °Problem: Activity: °Goal: Ability to tolerate increased activity will improve °Outcome: Progressing °  °Problem: Clinical Measurements: °Goal: Ability to maintain a body temperature in the normal range will improve °Outcome: Progressing °  °Problem: Respiratory: °Goal: Ability to maintain adequate ventilation will improve °Outcome: Progressing °Goal: Ability to maintain a clear airway will improve °Outcome: Progressing °  °Problem: Education: °Goal: Knowledge of General Education information will improve °Description: Including pain rating scale, medication(s)/side effects and non-pharmacologic comfort measures °Outcome: Progressing °  °Problem: Health Behavior/Discharge Planning: °Goal: Ability to manage health-related needs will improve °Outcome: Progressing °  °Problem: Clinical Measurements: °Goal: Ability to maintain clinical measurements within normal limits will improve °Outcome: Progressing °Goal: Will remain free from infection °Outcome: Progressing °Goal: Diagnostic test results will improve °Outcome: Progressing °Goal: Respiratory complications will improve °Outcome: Progressing °Goal: Cardiovascular complication will be avoided °Outcome: Progressing °  °Problem: Activity: °Goal: Risk for activity intolerance will decrease °Outcome: Progressing °  °Problem: Nutrition: °Goal: Adequate nutrition will be maintained °Outcome: Progressing °  °Problem: Coping: °Goal: Level of anxiety will  decrease °Outcome: Progressing °  °Problem: Elimination: °Goal: Will not experience complications related to bowel motility °Outcome: Progressing °Goal: Will not experience complications related to urinary retention °Outcome: Progressing °  °Problem: Pain Managment: °Goal: General experience of comfort will improve °Outcome: Progressing °  °Problem: Safety: °Goal: Ability to remain free from injury will improve °Outcome: Progressing °  °Problem: Skin Integrity: °Goal: Risk for impaired skin integrity will decrease °Outcome: Progressing °  °

## 2022-09-11 NOTE — Consult Note (Addendum)
   Heart Failure Nurse Navigator Note  HFpEF 60-65%.  Mild LVH.  Diastolic dysfunction indeterminate.  Right ventricular systolic function mildly reduced.  Moderate biatrial enlargement.  Mild pulmonary insufficiency.  Mild aortic insufficiency.  He presented to the emergency room with complaints of worsening shortness of breath, lower extremity edema and a 7 pound weight gain.  BNP 544.  Chest x-ray revealed findings with CHF and multifocal pneumonia.  Comorbidities:  Atrial fibrillation COPD Coronary artery disease status post CABG Hypertension Hyperlipidemia Obesity  Medications:  Amlodipine 5 mg daily Apixaban 5 mg every 12 hours 81 mg aspirin Atorvastatin 20 mg daily Farxiga 10 mg daily Furosemide 40 mg IV 2 times a day Losartan 50 mg daily Potassium chloride 40 mEq daily  Labs:  Sodium 139, potassium 3.4, chloride 98, CO2 30, BUN 24, creatinine 0.06, estimated GFR 60, magnesium 2, hemoglobin 14.3, hematocrit 42.9 Weight is not documented Blood pressure 146/57 Intake 240 mL Output 2551 mL   Initial meeting with patient today, he was sitting up in the bed remains on O2 per nasal cannula, daughter was present at the bedside.   Discussed what heart failure means.  He states that he really did not have an understanding of what heart failure meant prior to today's meeting.   Daughter states that this seems like he gets into trouble like this after he has been traveling or eating in restaurants.  He does admit to some use of salt at the table.  Went over eating low-sodium diet,, eating healthier at restaurants, abstaining from use of salt etc.  Went over fluid restriction of 64 ounces in a days time and what constitutes a liquid, including Jell-O, ice cream and pudding.  Made aware of heart failure clinic appointment which is on October 9 at 10:30 in the morning.  He has a 0% no-show ratio.  He was given a living with heart failure teaching booklet, zone magnet, info on  heart failure and low-sodium foods.  He was also given a weight chart.  Neither he nor his daughter had any further questions.  We will continue to follow along.  Pricilla Riffle RN CHFN

## 2022-09-11 NOTE — Progress Notes (Signed)
SUBJECTIVE: Feeling much better   Vitals:   09/10/22 2037 09/11/22 0040 09/11/22 0500 09/11/22 0749  BP: (!) 152/63 (!) 153/68 (!) 149/55 (!) 147/66  Pulse: (!) 50   (!) 54  Resp: '18 17 15 16  '$ Temp: 97.9 F (36.6 C) 98.5 F (36.9 C) 98.7 F (37.1 C) 98 F (36.7 C)  TempSrc:  Oral Oral   SpO2: 96% 98% 100% 99%  Weight:      Height:        Intake/Output Summary (Last 24 hours) at 09/11/2022 0842 Last data filed at 09/11/2022 9476 Gross per 24 hour  Intake 240 ml  Output 2551 ml  Net -2311 ml    LABS: Basic Metabolic Panel: Recent Labs    09/10/22 0436 09/11/22 0535  NA 142 139  K 2.8* 3.4*  CL 99 98  CO2 32 30  GLUCOSE 105* 91  BUN 24* 24*  CREATININE 1.07 1.06  CALCIUM 8.6* 8.5*  MG 1.9 2.0  PHOS 3.7 4.1   Liver Function Tests: No results for input(s): "AST", "ALT", "ALKPHOS", "BILITOT", "PROT", "ALBUMIN" in the last 72 hours. No results for input(s): "LIPASE", "AMYLASE" in the last 72 hours. CBC: Recent Labs    09/08/22 2119 09/10/22 0436 09/11/22 0535  WBC 19.6* 17.2* 14.6*  NEUTROABS 17.0*  --   --   HGB 14.3 13.8 14.3  HCT 43.4 41.4 42.9  MCV 92.5 90.2 92.1  PLT 672* 591* 548*   Cardiac Enzymes: No results for input(s): "CKTOTAL", "CKMB", "CKMBINDEX", "TROPONINI" in the last 72 hours. BNP: Invalid input(s): "POCBNP" D-Dimer: No results for input(s): "DDIMER" in the last 72 hours. Hemoglobin A1C: No results for input(s): "HGBA1C" in the last 72 hours. Fasting Lipid Panel: No results for input(s): "CHOL", "HDL", "LDLCALC", "TRIG", "CHOLHDL", "LDLDIRECT" in the last 72 hours. Thyroid Function Tests: No results for input(s): "TSH", "T4TOTAL", "T3FREE", "THYROIDAB" in the last 72 hours.  Invalid input(s): "FREET3" Anemia Panel: No results for input(s): "VITAMINB12", "FOLATE", "FERRITIN", "TIBC", "IRON", "RETICCTPCT" in the last 72 hours.   PHYSICAL EXAM General: Well developed, well nourished, in no acute distress HEENT:  Normocephalic and  atramatic Neck:  No JVD.  Lungs: Clear bilaterally to auscultation and percussion. Heart: HRRR . Normal S1 and S2 without gallops or murmurs.  Abdomen: Bowel sounds are positive, abdomen soft and non-tender  Msk:  Back normal, normal gait. Normal strength and tone for age. Extremities: No clubbing, cyanosis or edema.   Neuro: Alert and oriented X 3. Psych:  Good affect, responds appropriately  TELEMETRY: Sinus rhythm  ASSESSMENT AND PLAN: Decompensated heart failure with a history of atrial fibrillation and since coronary artery disease status post CABG.  Patient is feeling better with IV Lasix.  Plan on switching to p.o. Lasix and consider discharge with follow-up with Dr. Raliegh Scarlet.  Principal Problem:   Acute on chronic diastolic CHF (congestive heart failure) (HCC) Active Problems:   COPD (chronic obstructive pulmonary disease) (HCC)   Multifocal pneumonia   Paroxysmal atrial fibrillation (HCC)   Chronic anticoagulation   CAD with Hx of CABG   Hypertension   Hypertensive urgency   Elevated troponin    Neoma Laming A, MD, [facility] 09/11/2022 8:42 AM

## 2022-09-11 NOTE — Progress Notes (Signed)
Nutrition Brief Note  RD pulled to chart secondary to CHF.   Wt Readings from Last 15 Encounters:  09/08/22 98.9 kg  06/19/20 98.4 kg  10/02/19 93.4 kg  12/11/18 96.1 kg  12/16/17 99.2 kg  12/09/17 93.4 kg  04/17/15 95.3 kg   Pt with PMH significant for paroxysmal A-fib on Eliquis, CAD s/p CABG, hypertension, hyperlipidemia, diastolic CHF with recent LVEF of 55% on echo on 05/12/2022, recently seen by his cardiologist 2 days ago with lower extremity edema and 7 pound weight gain  Pt admitted with CHF.   RD provided "Low Sodium Nutrition Therapy" handout from [facility] Nutrition Care Manual and attached to AVS/ discharge summary.   Body mass index is 34.14 kg/m. Patient meets criteria for obese based on current BMI. Obesity is a complex, chronic medical condition that is optimally managed by a multidisciplinary care team. Weight loss is not an ideal goal for an acute inpatient hospitalization. However, if further work-up for obesity is warranted, consider outpatient referral to outpatient bariatric service and/or [facility].    Current diet order is Heart Healthy (liberalized to 2 gram sodium), patient is consuming approximately n/a% of meals at this time. Labs and medications reviewed.   No nutrition interventions warranted at this time. If nutrition issues arise, please consult RD.   Loistine Chance, RD, LDN, [facility] Registered Dietitian II Certified Diabetes Care and Education Specialist Please refer to [facility] for RD and/or RD on-call/weekend/after hours pager

## 2022-09-11 NOTE — Care Management Important Message (Signed)
Important Message  Patient Details  Name: David Hardin MRN: [id number] Date of Birth: July 09, 1927   Medicare Important Message Given:  N/A - LOS <3 / Initial given by admissions     Dannette Barbara 09/11/2022, 9:45 AM

## 2022-09-11 NOTE — Discharge Instructions (Signed)

## 2022-09-11 NOTE — Evaluation (Signed)
Physical Therapy Evaluation Patient Details Name: David Hardin MRN: [id number] DOB: Apr 14, 1927 Today's Date: 09/11/2022  History of Present Illness  Pt admitted for acute on chronic CHF. History includes Afib, CAD, HTN, and HLD.  Clinical Impression  Pt is a pleasant 88 year old male who was admitted for acute on chronic CHF. Pt demonstrates all bed mobility/transfers/ambulation at baseline level. Pt does not require any further PT needs at this time. Pt will be dc in house and does not require follow up. RN aware. Will dc current orders.  SaO2 on room air at rest = 91% SaO2 on room air while ambulating = 87% SaO2 on 2 liters of O2 while ambulating = 93%       Recommendations for follow up therapy are one component of a multi-disciplinary discharge planning process, led by the attending physician.  Recommendations may be updated based on patient status, additional functional criteria and insurance authorization.  Follow Up Recommendations No PT follow up      Assistance Recommended at Discharge None  Patient can return home with the following       Equipment Recommendations None recommended by PT  Recommendations for Other Services       Functional Status Assessment Patient has not had a recent decline in their functional status     Precautions / Restrictions Restrictions Weight Bearing Restrictions: No      Mobility  Bed Mobility Overal bed mobility: Independent             General bed mobility comments: safe technique with ease of mobility    Transfers Overall transfer level: Independent Equipment used: None               General transfer comment: able to stand with ease. Does request to remain still for a few minutes to "get settled". No AD used. All mobility initially performed on RA and then transitioned to 2L of O2.    Ambulation/Gait Ambulation/Gait assistance: Supervision Gait Distance (Feet): 160 Feet Assistive device: None Gait  Pattern/deviations: Step-through pattern       General Gait Details: performed multiple laps in room with 1 seated rest break to don O2. Reciprocal gait pattern performed and safe technique. No SOB symptoms noted  Stairs            Wheelchair Mobility    Modified Rankin (Stroke Patients Only)       Balance Overall balance assessment: No apparent balance deficits (not formally assessed)                                           Pertinent Vitals/Pain Pain Assessment Pain Assessment: No/denies pain    Home Living Family/patient expects to be discharged to:: Private residence Living Arrangements: Spouse/significant other Available Help at Discharge: Family Type of Home: House Home Access: Stairs to enter Entrance Stairs-Rails: Can reach both Entrance Stairs-Number of Steps: 6   Home Layout: Two level;Able to live on main level with bedroom/bathroom Home Equipment: None      Prior Function Prior Level of Function : Independent/Modified Independent;Driving             Mobility Comments: reports he jogs almost every day and is working in his tractor. ADLs Comments: reports no falls     Hand Dominance        Extremity/Trunk Assessment   Upper Extremity Assessment Upper Extremity Assessment: Overall  WFL for tasks assessed    Lower Extremity Assessment Lower Extremity Assessment: Overall WFL for tasks assessed       Communication   Communication: No difficulties  Cognition Arousal/Alertness: Awake/alert Behavior During Therapy: WFL for tasks assessed/performed Overall Cognitive Status: Within Functional Limits for tasks assessed                                          General Comments      Exercises     Assessment/Plan    PT Assessment Patient does not need any further PT services  PT Problem List         PT Treatment Interventions      PT Goals (Current goals can be found in the Care Plan section)   Acute Rehab PT Goals Patient Stated Goal: to go home PT Goal Formulation: All assessment and education complete, DC therapy Time For Goal Achievement: 09/11/22 Potential to Achieve Goals: Good    Frequency       Co-evaluation               AM-PAC PT "6 Clicks" Mobility  Outcome Measure Help needed turning from your back to your side while in a flat bed without using bedrails?: None Help needed moving from lying on your back to sitting on the side of a flat bed without using bedrails?: None Help needed moving to and from a bed to a chair (including a wheelchair)?: None Help needed standing up from a chair using your arms (e.g., wheelchair or bedside chair)?: None Help needed to walk in hospital room?: None Help needed climbing 3-5 steps with a railing? : None 6 Click Score: 24    End of Session Equipment Utilized During Treatment: Gait belt;Oxygen Activity Tolerance: Patient tolerated treatment well Patient left: in chair Nurse Communication: Mobility status PT Visit Diagnosis: Difficulty in walking, not elsewhere classified (R26.2)    Time: 5631-4970 PT Time Calculation (min) (ACUTE ONLY): 18 min   Charges:   PT Evaluation $PT Eval Low Complexity: 1 Low PT Treatments $Gait Training: 8-22 mins        Greggory Stallion, PT, DPT, [facility] [phone]   David Hardin 09/11/2022, 3:00 PM

## 2022-09-11 NOTE — Progress Notes (Signed)
PROGRESS NOTE    David Hardin  PYK:[id number] DOB: [birth year] DOA: 09/08/2022 PCP: Derinda Late, MD   Brief Narrative: This 88 years old male with PMH significant for paroxysmal A-fib on Eliquis, CAD s/p CABG, hypertension, hyperlipidemia, diastolic CHF with recent LVEF of 55% on echo on 05/12/2022, recently seen by his cardiologist 2 days ago with lower extremity edema and 7 pound weight gain.  His Lasix dose was increased.  Patient reports no improvement , So he presented in the ED.  Work-up in the ED reveals blood pressure 213/73,  troponin 33> 187, BNP 545,  WBC 19 K, lactic acid 1.6.  CTA chest ruled out pulmonary embolism but shows cardiomegaly with findings consistent with CHF and multifocal pneumonia.  Patient was given Lasix 60 mg and started on azithromycin and Rocephin for community-acquired pneumonia.  Elevated troponin could be due to demand ischemia in the setting of multifocal pneumonia and acute on chronic CHF.  Heart rate remains reasonably controlled.  Continue home medications.  Assessment & Plan:   Principal Problem:   Acute on chronic diastolic CHF (congestive heart failure) (HCC) Active Problems:   Multifocal pneumonia   Paroxysmal atrial fibrillation (HCC)   Hypertensive urgency   Elevated troponin   Chronic anticoagulation   COPD (chronic obstructive pulmonary disease) (HCC)   CAD with Hx of CABG   Hypertension  Acute on chronic diastolic CHF: Patient presented with worsening shortness of breath, weight gain and lower extremity edema. He has recently seen his PCP and his Lasix dose was increased.  Patient did not see any improvement. Continue IV Lasix 40 mg every 12 hours. Monitor intake output charting, daily weight Echo from June shows LVEF 55%. Cardiology is consulted recommended repeat echocardiogram.   Intake/Output Summary (Last 24 hours) at 09/11/2022 1608 Last data filed at 09/11/2022 1300 Gross per 24 hour  Intake 120 ml  Output 1901 ml  Net  -1781 ml    Multifocal pneumonia: CT chest confirms multifocal pneumonia. Continue empiric antibiotics (Rocephin and azithromycin). Continue DuoNebs as needed, antitussives Continue supplemental oxygen if needed Blood cultures no growth so far   Elevated troponins Suspect demand ischemia in the setting of CHF and pneumonia. Troponin 33> 185, denies any chest pain, EKG nonacute. Continue aspirin and Lipitor.  Not on metoprolol for unclear reasons.  Essential hypertension: Continue amlodipine. Started on losartan 50 mg dialy.   Paroxysmal atrial fibrillation: Heart rate is reasonably controlled.  Continue Eliquis. Not on rate controlling medications.   COPD (chronic obstructive pulmonary disease) Not currently exacerbated.  DuoNebs as needed    Obesity: Estimated body mass index is 34.14 kg/m as calculated from the following:   Height as of this encounter: '5\' 7"'$  (1.702 m).   Weight as of this encounter: 98.9 kg.  Diet and exercise discussed in detail.  DVT prophylaxis:Eliquis Code Status: Full code Family Communication: No family at bed side Disposition Plan:   Status is: Inpatient Remains inpatient appropriate because: Admitted for community-acquired pneumonia requiring IV antibiotics and decompensated heart failure requiring IV diuresis.  Cardiology is consulted, advised to continue diuresis.  Anticipated discharge home 09/12/2022   Consultants:  Cardiology  Procedures: Echocardiogram Antimicrobials:  Anti-infectives (From admission, onward)    Start     Dose/Rate Route Frequency Ordered Stop   09/10/22 0000  cefTRIAXone (ROCEPHIN) 2 g in sodium chloride 0.9 % 100 mL IVPB        2 g 200 mL/hr over 30 Minutes Intravenous Every 24 hours 09/09/22 0047 09/13/22 2359  09/09/22 0100  azithromycin (ZITHROMAX) 500 mg in sodium chloride 0.9 % 250 mL IVPB        500 mg 250 mL/hr over 60 Minutes Intravenous Every 24 hours 09/09/22 0047 09/14/22 0059   09/08/22 2330   cefTRIAXone (ROCEPHIN) 1 g in sodium chloride 0.9 % 100 mL IVPB        1 g 200 mL/hr over 30 Minutes Intravenous  Once 09/08/22 2325 09/09/22 0112   09/08/22 2330  azithromycin (ZITHROMAX) 500 mg in sodium chloride 0.9 % 250 mL IVPB  Status:  Discontinued        500 mg 250 mL/hr over 60 Minutes Intravenous  Once 09/08/22 2325 09/09/22 0054       Subjective: Patient was seen and examined at bedside.  Overnight events noted.   Patient reports feeling much improved.-4.7 L negative balance since admission He states that he feels much improved. Leg swelling is improving  Objective: Vitals:   09/11/22 0500 09/11/22 0749 09/11/22 1135 09/11/22 1540  BP: (!) 149/55 (!) 147/66 (!) 146/57 129/64  Pulse:  (!) 54 (!) 51 (!) 58  Resp: '15 16 18   '$ Temp: 98.7 F (37.1 C) 98 F (36.7 C) 97.6 F (36.4 C) 97.6 F (36.4 C)  TempSrc: Oral     SpO2: 100% 99% 97% 95%  Weight:      Height:        Intake/Output Summary (Last 24 hours) at 09/11/2022 1608 Last data filed at 09/11/2022 1300 Gross per 24 hour  Intake 120 ml  Output 1901 ml  Net -1781 ml   Filed Weights   09/08/22 2110  Weight: 98.9 kg    Examination:  General exam: Appears comfortable, not in any acute distress, deconditioned. Respiratory system: CTA bilaterally, no wheezing, no crackles, normal respiratory effort. Cardiovascular system: S1 & S2 heard, irregular rhythm, no murmur. Gastrointestinal system: Abdomen is soft, non tender, non distended, BS+ Central nervous system: Alert and oriented x3. No focal neurological deficits. Extremities: Edema+, no cyanosis, no clubbing Skin: No rashes, lesions or ulcers Psychiatry: Judgement and insight appear normal. Mood & affect appropriate.     Data Reviewed: I have personally reviewed following labs and imaging studies  CBC: Recent Labs  Lab 09/08/22 2119 09/10/22 0436 09/11/22 0535  WBC 19.6* 17.2* 14.6*  NEUTROABS 17.0*  --   --   HGB 14.3 13.8 14.3  HCT 43.4 41.4  42.9  MCV 92.5 90.2 92.1  PLT 672* 591* 010*   Basic Metabolic Panel: Recent Labs  Lab 09/08/22 2119 09/10/22 0436 09/11/22 0535  NA 140 142 139  K 3.1* 2.8* 3.4*  CL 101 99 98  CO2 27 32 30  GLUCOSE 136* 105* 91  BUN 17 24* 24*  CREATININE 0.97 1.07 1.06  CALCIUM 8.6* 8.6* 8.5*  MG  --  1.9 2.0  PHOS  --  3.7 4.1   GFR: Estimated Creatinine Clearance: 49.8 mL/min (by C-G formula based on SCr of 1.06 mg/dL). Liver Function Tests: No results for input(s): "AST", "ALT", "ALKPHOS", "BILITOT", "PROT", "ALBUMIN" in the last 168 hours. No results for input(s): "LIPASE", "AMYLASE" in the last 168 hours. No results for input(s): "AMMONIA" in the last 168 hours. Coagulation Profile: No results for input(s): "INR", "PROTIME" in the last 168 hours. Cardiac Enzymes: No results for input(s): "CKTOTAL", "CKMB", "CKMBINDEX", "TROPONINI" in the last 168 hours. BNP (last 3 results) No results for input(s): "PROBNP" in the last 8760 hours. HbA1C: No results for input(s): "HGBA1C" in the last  72 hours. CBG: No results for input(s): "GLUCAP" in the last 168 hours. Lipid Profile: No results for input(s): "CHOL", "HDL", "LDLCALC", "TRIG", "CHOLHDL", "LDLDIRECT" in the last 72 hours. Thyroid Function Tests: No results for input(s): "TSH", "T4TOTAL", "FREET4", "T3FREE", "THYROIDAB" in the last 72 hours. Anemia Panel: No results for input(s): "VITAMINB12", "FOLATE", "FERRITIN", "TIBC", "IRON", "RETICCTPCT" in the last 72 hours. Sepsis Labs: Recent Labs  Lab 09/09/22 0003  LATICACIDVEN 1.6    Recent Results (from the past 240 hour(s))  Blood culture (routine x 2)     Status: None (Preliminary result)   Collection Time: 09/08/22 11:50 PM   Specimen: BLOOD  Result Value Ref Range Status   Specimen Description BLOOD BLOOD RIGHT HAND  Final   Special Requests   Final    BOTTLES DRAWN AEROBIC AND ANAEROBIC Blood Culture results may not be optimal due to an excessive volume of blood received  in culture bottles   Culture   Final    NO GROWTH 1 DAY Performed at [facility], [address], [place], [place] [address]    Report Status PENDING  Incomplete  Resp Panel by RT-PCR (Flu A&B, Covid) Anterior Nasal Swab     Status: None   Collection Time: 09/09/22 12:03 AM   Specimen: Anterior Nasal Swab  Result Value Ref Range Status   SARS Coronavirus 2 by RT PCR NEGATIVE NEGATIVE Final    Comment: (NOTE) SARS-CoV-2 target nucleic acids are NOT DETECTED.  The SARS-CoV-2 RNA is generally detectable in upper respiratory specimens during the acute phase of infection. The lowest concentration of SARS-CoV-2 viral copies this assay can detect is 138 copies/mL. A negative result does not preclude SARS-Cov-2 infection and should not be used as the sole basis for treatment or other patient management decisions. A negative result may occur with  improper specimen collection/handling, submission of specimen other than nasopharyngeal swab, presence of viral mutation(s) within the areas targeted by this assay, and inadequate number of viral copies(<138 copies/mL). A negative result must be combined with clinical observations, patient history, and epidemiological information. The expected result is Negative.  Fact Sheet for Patients:  EntrepreneurPulse.com.au  Fact Sheet for Healthcare Providers:  IncredibleEmployment.be  This test is no t yet approved or cleared by the [place] FDA and  has been authorized for detection and/or diagnosis of SARS-CoV-2 by FDA under an Emergency Use Authorization (EUA). This EUA will remain  in effect (meaning this test can be used) for the duration of the COVID-19 declaration under Section 564(b)(1) of the Act, 21 U.S.C.section 360bbb-3(b)(1), unless the authorization is terminated  or revoked sooner.       Influenza A by PCR NEGATIVE NEGATIVE Final   Influenza B by PCR NEGATIVE NEGATIVE Final     Comment: (NOTE) The Xpert Xpress SARS-CoV-2/FLU/RSV plus assay is intended as an aid in the diagnosis of influenza from Nasopharyngeal swab specimens and should not be used as a sole basis for treatment. Nasal washings and aspirates are unacceptable for Xpert Xpress SARS-CoV-2/FLU/RSV testing.  Fact Sheet for Patients: EntrepreneurPulse.com.au  Fact Sheet for Healthcare Providers: IncredibleEmployment.be  This test is not yet approved or cleared by the [place] FDA and has been authorized for detection and/or diagnosis of SARS-CoV-2 by FDA under an Emergency Use Authorization (EUA). This EUA will remain in effect (meaning this test can be used) for the duration of the COVID-19 declaration under Section 564(b)(1) of the Act, 21 U.S.C. section 360bbb-3(b)(1), unless the authorization is terminated or revoked.  Performed at [facility], [address] [place], [place] [address]   Blood culture (routine x 2)     Status: None (Preliminary result)   Collection Time: 09/09/22 12:03 AM   Specimen: BLOOD  Result Value Ref Range Status   Specimen Description BLOOD RIGHT ANTECUBITAL  Final   Special Requests   Final    BOTTLES DRAWN AEROBIC AND ANAEROBIC Blood Culture results may not be optimal due to an excessive volume of blood received in culture bottles   Culture   Final    NO GROWTH 1 DAY Performed at [facility] [address] [place], [place] [address]    Report Status PENDING  Incomplete     Radiology Studies: ECHOCARDIOGRAM COMPLETE  Result Date: 09/10/2022    ECHOCARDIOGRAM REPORT   Patient Name:   David Hardin Date of Exam: 09/10/2022 Medical Rec #:  [id number]     Height:       67.0 in Accession #:    [id number]    Weight:       218.0 lb Date of Birth:  [birth year]    BSA:          2.098 m Patient Age:    55 years      BP:           132/49 mmHg Patient Gender: M             HR:           56 bpm. Exam Location:   [facility] Procedure: 2D Echo, Color Doppler and Cardiac Doppler Indications:     I50.31 congestive heart failure-Acute Diastolic  History:         Patient has no prior history of Echocardiogram examinations.                  CAD, COPD, Arrythmias:Atrial Fibrillation; Risk                  Factors:Hypertension and Dyslipidemia.  Sonographer:     Charmayne Sheer Referring Phys:  [place] Diagnosing Phys: [place]  1. Left ventricular ejection fraction, by estimation, is 60 to 65%. The left ventricle has normal function. The left ventricle demonstrates regional wall motion abnormalities (see scoring diagram/findings for description). The left ventricular internal cavity size was mildly dilated. There is mild left ventricular hypertrophy. Left ventricular diastolic parameters are indeterminate.  2. Right ventricular systolic function is mildly reduced. The right ventricular size is mildly enlarged. Mildly increased right ventricular wall thickness.  3. Left atrial size was moderately dilated.  4. Right atrial size was moderately dilated.  5. The mitral valve is grossly normal. Mild mitral valve regurgitation.  6. The aortic valve is calcified. Aortic valve regurgitation is mild. Aortic valve sclerosis/calcification is present, without any evidence of aortic stenosis. FINDINGS  Left Ventricle: Left ventricular ejection fraction, by estimation, is 60 to 65%. The left ventricle has normal function. The left ventricle demonstrates regional wall motion abnormalities. The left ventricular internal cavity size was mildly dilated. There is mild left ventricular hypertrophy. Left ventricular diastolic parameters are indeterminate. Right Ventricle: The right ventricular size is mildly enlarged. Mildly increased right ventricular wall thickness. Right ventricular systolic function is mildly reduced. Left Atrium: Left atrial size was moderately dilated. Right Atrium: Right atrial size was moderately dilated.  Pericardium: There is no evidence of pericardial effusion. Mitral Valve: The mitral valve is grossly normal. Mild mitral valve regurgitation. Tricuspid Valve: The tricuspid valve is  grossly normal. Tricuspid valve regurgitation is mild. Aortic Valve: The aortic valve is calcified. Aortic valve regurgitation is mild. Aortic valve sclerosis/calcification is present, without any evidence of aortic stenosis. Aortic valve mean gradient measures 5.0 mmHg. Aortic valve peak gradient measures 9.0 mmHg. Aortic valve area, by VTI measures 2.59 cm. Pulmonic Valve: The pulmonic valve was grossly normal. Pulmonic valve regurgitation is mild. Aorta: The aortic root, ascending aorta and aortic arch are all structurally normal, with no evidence of dilitation or obstruction. IAS/Shunts: No atrial level shunt detected by color flow Doppler.  LEFT VENTRICLE PLAX 2D LVIDd:         5.20 cm   Diastology LVIDs:         3.30 cm   LV e' medial:    7.51 cm/s LV PW:         1.10 cm   LV E/e' medial:  16.1 LV IVS:        1.30 cm   LV e' lateral:   7.62 cm/s LVOT diam:     2.10 cm   LV E/e' lateral: 15.8 LV SV:         75 LV SV Index:   36 LVOT Area:     3.46 cm  RIGHT VENTRICLE RV Basal diam:  3.80 cm RV S prime:     9.36 cm/s TAPSE (M-mode): 1.5 cm LEFT ATRIUM              Index        RIGHT ATRIUM           Index LA diam:        5.10 cm  2.43 cm/m   RA Area:     18.70 cm LA Vol (A2C):   100.0 ml 47.67 ml/m  RA Volume:   45.50 ml  21.69 ml/m LA Vol (A4C):   96.4 ml  45.96 ml/m LA Biplane Vol: 106.0 ml 50.53 ml/m  AORTIC VALVE                    PULMONIC VALVE AV Area (Vmax):    2.61 cm     PV Vmax:          1.05 m/s AV Area (Vmean):   2.46 cm     PV Peak grad:     4.4 mmHg AV Area (VTI):     2.59 cm     PR End Diast Vel: 4.93 msec AV Vmax:           150.00 cm/s AV Vmean:          98.100 cm/s AV VTI:            0.289 m AV Peak Grad:      9.0 mmHg AV Mean Grad:      5.0 mmHg LVOT Vmax:         113.00 cm/s LVOT Vmean:        69.600  cm/s LVOT VTI:          0.216 m LVOT/AV VTI ratio: 0.75  AORTA Ao Root diam: 3.40 cm MITRAL VALVE MV Area (PHT): 3.56 cm     SHUNTS MV Decel Time: 213 msec     Systemic VTI:  0.22 m MV E velocity: 120.67 cm/s  Systemic Diam: 2.10 cm Neoma Laming Electronically signed by Neoma Laming Signature Date/Time: 09/10/2022/6:25:44 PM    Final      Scheduled Meds:  amLODipine  5 mg Oral Daily   apixaban  5 mg  Oral Q12H   aspirin  81 mg Oral Daily   atorvastatin  20 mg Oral Daily   dapagliflozin propanediol  10 mg Oral Daily   fluticasone furoate-vilanterol  1 puff Inhalation Daily   furosemide  40 mg Intravenous BID   losartan  50 mg Oral Daily   melatonin  5 mg Oral QHS   potassium chloride  40 mEq Oral Daily   Continuous Infusions:  azithromycin 500 mg (09/11/22 0214)   cefTRIAXone (ROCEPHIN)  IV 2 g (09/11/22 0040)     LOS: 3 days    Time spent: 35 mins    Deari Sessler, MD [facility]   If 7PM-7AM, please contact night-coverage

## 2022-09-12 DIAGNOSIS — I5033 Acute on chronic diastolic (congestive) heart failure: Secondary | ICD-10-CM | POA: Diagnosis not present

## 2022-09-12 LAB — BASIC METABOLIC PANEL
Anion gap: 9 (ref 5–15)
BUN: 24 mg/dL — ABNORMAL HIGH (ref 8–23)
CO2: 32 mmol/L (ref 22–32)
Calcium: 8.7 mg/dL — ABNORMAL LOW (ref 8.9–10.3)
Chloride: 100 mmol/L (ref 98–111)
Creatinine, Ser: 1.08 mg/dL (ref 0.61–1.24)
GFR, Estimated: >60 mL/min (ref >60)
Glucose, Bld: 103 mg/dL — ABNORMAL HIGH (ref 70–99)
Potassium: 3.3 mmol/L — ABNORMAL LOW (ref 3.5–5.1)
Sodium: 141 mmol/L (ref 135–145)

## 2022-09-12 LAB — CBC
HCT: 44.8 % (ref 39.0–52.0)
Hemoglobin: 14.7 g/dL (ref 13.0–17.0)
MCH: 29.8 pg (ref 26.0–34.0)
MCHC: 32.8 g/dL (ref 30.0–36.0)
MCV: 90.7 fL (ref 80.0–100.0)
Platelets: 595 10*3/uL — ABNORMAL HIGH (ref 150–400)
RBC: 4.94 MIL/uL (ref 4.22–5.81)
RDW: 15.6 % — ABNORMAL HIGH (ref 11.5–15.5)
WBC: 13.5 10*3/uL — ABNORMAL HIGH (ref 4.0–10.5)
nRBC: 0 % (ref 0.0–0.2)

## 2022-09-12 LAB — MAGNESIUM: Magnesium: 2 mg/dL (ref 1.7–2.4)

## 2022-09-12 LAB — PHOSPHORUS: Phosphorus: 3.4 mg/dL (ref 2.5–4.6)

## 2022-09-12 MED ORDER — DAPAGLIFLOZIN PROPANEDIOL 10 MG PO TABS: 10.0000 mg | ORAL_TABLET | Freq: Every day | ORAL | 1 refills | Status: AC

## 2022-09-12 MED ORDER — POTASSIUM CHLORIDE 20 MEQ PO PACK: 40.0000 meq | PACK | Freq: Once | ORAL | Status: AC

## 2022-09-12 MED ORDER — CEFDINIR 300 MG PO CAPS: 300.0000 mg | ORAL_CAPSULE | Freq: Two times a day (BID) | ORAL | 0 refills | Status: AC

## 2022-09-12 MED ORDER — LOSARTAN POTASSIUM 50 MG PO TABS: 50.0000 mg | ORAL_TABLET | Freq: Every day | ORAL | 1 refills | Status: DC

## 2022-09-12 MED ORDER — TAMSULOSIN HCL 0.4 MG PO CAPS
0.4000 mg | ORAL_CAPSULE | Freq: Every day | ORAL | Status: DC
  Administered 2022-09-12: 0.4 mg via ORAL
  Filled 2022-09-12: qty 1

## 2022-09-12 NOTE — Progress Notes (Signed)
D/C instructions reviewed with pt and spouse, pivs removed, pressure dressing applied. New meds reviewed as well.

## 2022-09-12 NOTE — Progress Notes (Signed)
[facility] Cardiology  SUBJECTIVE: Patient laying in bed, reports feeling much better, denies chest pain or shortness of breath   Vitals:   09/11/22 2325 09/12/22 0450 09/12/22 0453 09/12/22 0740  BP: (!) 152/68  (!) 156/88 (!) 139/59  Pulse: (!) 55  68 (!) 57  Resp: 20  16   Temp: 98.3 F (36.8 C)  (!) 97.4 F (36.3 C) 98.1 F (36.7 C)  TempSrc: Oral  Oral   SpO2: 91%  90% 91%  Weight:  87.8 kg    Height:         Intake/Output Summary (Last 24 hours) at 09/12/2022 8850 Last data filed at 09/12/2022 0454 Gross per 24 hour  Intake 820 ml  Output 1850 ml  Net -1030 ml      PHYSICAL EXAM  General: Well developed, well nourished, in no acute distress HEENT:  Normocephalic and atramatic Neck:  No JVD.  Lungs: Clear bilaterally to auscultation and percussion. Heart: HRRR . Normal S1 and S2 without gallops or murmurs.  Abdomen: Bowel sounds are positive, abdomen soft and non-tender  Msk:  Back normal, normal gait. Normal strength and tone for age. Extremities: No clubbing, cyanosis or edema.   Neuro: Alert and oriented X 3. Psych:  Good affect, responds appropriately   LABS: Basic Metabolic Panel: Recent Labs    09/11/22 0535 09/12/22 0545  NA 139 141  K 3.4* 3.3*  CL 98 100  CO2 30 32  GLUCOSE 91 103*  BUN 24* 24*  CREATININE 1.06 1.08  CALCIUM 8.5* 8.7*  MG 2.0 2.0  PHOS 4.1 3.4   Liver Function Tests: No results for input(s): "AST", "ALT", "ALKPHOS", "BILITOT", "PROT", "ALBUMIN" in the last 72 hours. No results for input(s): "LIPASE", "AMYLASE" in the last 72 hours. CBC: Recent Labs    09/11/22 0535 09/12/22 0545  WBC 14.6* 13.5*  HGB 14.3 14.7  HCT 42.9 44.8  MCV 92.1 90.7  PLT 548* 595*   Cardiac Enzymes: No results for input(s): "CKTOTAL", "CKMB", "CKMBINDEX", "TROPONINI" in the last 72 hours. BNP: Invalid input(s): "POCBNP" D-Dimer: No results for input(s): "DDIMER" in the last 72 hours. Hemoglobin A1C: No results for input(s): "HGBA1C" in the  last 72 hours. Fasting Lipid Panel: No results for input(s): "CHOL", "HDL", "LDLCALC", "TRIG", "CHOLHDL", "LDLDIRECT" in the last 72 hours. Thyroid Function Tests: No results for input(s): "TSH", "T4TOTAL", "T3FREE", "THYROIDAB" in the last 72 hours.  Invalid input(s): "FREET3" Anemia Panel: No results for input(s): "VITAMINB12", "FOLATE", "FERRITIN", "TIBC", "IRON", "RETICCTPCT" in the last 72 hours.  ECHOCARDIOGRAM COMPLETE  Result Date: 09/10/2022    ECHOCARDIOGRAM REPORT   Patient Name:   CROSLEY STEJSKAL Date of Exam: 09/10/2022 Medical Rec #:  [id number]     Height:       67.0 in Accession #:    [id number]    Weight:       218.0 lb Date of Birth:  [birth year]    BSA:          2.098 m Patient Age:    88 years      BP:           132/49 mmHg Patient Gender: M             HR:           56 bpm. Exam Location:  [facility] Procedure: 2D Echo, Color Doppler and Cardiac Doppler Indications:     I50.31 congestive heart failure-Acute Diastolic  History:  Patient has no prior history of Echocardiogram examinations.                  CAD, COPD, Arrythmias:Atrial Fibrillation; Risk                  Factors:Hypertension and Dyslipidemia.  Sonographer:     Charmayne Sheer Referring Phys:  [place] Diagnosing Phys: [place]  1. Left ventricular ejection fraction, by estimation, is 60 to 65%. The left ventricle has normal function. The left ventricle demonstrates regional wall motion abnormalities (see scoring diagram/findings for description). The left ventricular internal cavity size was mildly dilated. There is mild left ventricular hypertrophy. Left ventricular diastolic parameters are indeterminate.  2. Right ventricular systolic function is mildly reduced. The right ventricular size is mildly enlarged. Mildly increased right ventricular wall thickness.  3. Left atrial size was moderately dilated.  4. Right atrial size was moderately dilated.  5. The mitral valve is grossly normal. Mild mitral  valve regurgitation.  6. The aortic valve is calcified. Aortic valve regurgitation is mild. Aortic valve sclerosis/calcification is present, without any evidence of aortic stenosis. FINDINGS  Left Ventricle: Left ventricular ejection fraction, by estimation, is 60 to 65%. The left ventricle has normal function. The left ventricle demonstrates regional wall motion abnormalities. The left ventricular internal cavity size was mildly dilated. There is mild left ventricular hypertrophy. Left ventricular diastolic parameters are indeterminate. Right Ventricle: The right ventricular size is mildly enlarged. Mildly increased right ventricular wall thickness. Right ventricular systolic function is mildly reduced. Left Atrium: Left atrial size was moderately dilated. Right Atrium: Right atrial size was moderately dilated. Pericardium: There is no evidence of pericardial effusion. Mitral Valve: The mitral valve is grossly normal. Mild mitral valve regurgitation. Tricuspid Valve: The tricuspid valve is grossly normal. Tricuspid valve regurgitation is mild. Aortic Valve: The aortic valve is calcified. Aortic valve regurgitation is mild. Aortic valve sclerosis/calcification is present, without any evidence of aortic stenosis. Aortic valve mean gradient measures 5.0 mmHg. Aortic valve peak gradient measures 9.0 mmHg. Aortic valve area, by VTI measures 2.59 cm. Pulmonic Valve: The pulmonic valve was grossly normal. Pulmonic valve regurgitation is mild. Aorta: The aortic root, ascending aorta and aortic arch are all structurally normal, with no evidence of dilitation or obstruction. IAS/Shunts: No atrial level shunt detected by color flow Doppler.  LEFT VENTRICLE PLAX 2D LVIDd:         5.20 cm   Diastology LVIDs:         3.30 cm   LV e' medial:    7.51 cm/s LV PW:         1.10 cm   LV E/e' medial:  16.1 LV IVS:        1.30 cm   LV e' lateral:   7.62 cm/s LVOT diam:     2.10 cm   LV E/e' lateral: 15.8 LV SV:         75 LV SV Index:    36 LVOT Area:     3.46 cm  RIGHT VENTRICLE RV Basal diam:  3.80 cm RV S prime:     9.36 cm/s TAPSE (M-mode): 1.5 cm LEFT ATRIUM              Index        RIGHT ATRIUM           Index LA diam:        5.10 cm  2.43 cm/m   RA Area:  18.70 cm LA Vol (A2C):   100.0 ml 47.67 ml/m  RA Volume:   45.50 ml  21.69 ml/m LA Vol (A4C):   96.4 ml  45.96 ml/m LA Biplane Vol: 106.0 ml 50.53 ml/m  AORTIC VALVE                    PULMONIC VALVE AV Area (Vmax):    2.61 cm     PV Vmax:          1.05 m/s AV Area (Vmean):   2.46 cm     PV Peak grad:     4.4 mmHg AV Area (VTI):     2.59 cm     PR End Diast Vel: 4.93 msec AV Vmax:           150.00 cm/s AV Vmean:          98.100 cm/s AV VTI:            0.289 m AV Peak Grad:      9.0 mmHg AV Mean Grad:      5.0 mmHg LVOT Vmax:         113.00 cm/s LVOT Vmean:        69.600 cm/s LVOT VTI:          0.216 m LVOT/AV VTI ratio: 0.75  AORTA Ao Root diam: 3.40 cm MITRAL VALVE MV Area (PHT): 3.56 cm     SHUNTS MV Decel Time: 213 msec     Systemic VTI:  0.22 m MV E velocity: 120.67 cm/s  Systemic Diam: 2.10 cm Neoma Laming Electronically signed by Neoma Laming Signature Date/Time: 09/10/2022/6:25:44 PM    Final      Echo 60-65% 09/10/2022  TELEMETRY: Atrial fibrillation 61 bpm:  ASSESSMENT AND PLAN:  Principal Problem:   Acute on chronic diastolic CHF (congestive heart failure) (HCC) Active Problems:   COPD (chronic obstructive pulmonary disease) (HCC)   Multifocal pneumonia   Paroxysmal atrial fibrillation (HCC)   Chronic anticoagulation   CAD with Hx of CABG   Hypertension   Hypertensive urgency   Elevated troponin    1.  Elevated troponin (33, 185, 235 ),  likely demand supply ischemia, in the absence of chest pain 2.  Acute on chronic diastolic congestive heart failure, HFpEF, 60-65% 09/10/2022, likely secondary to dietary noncompliance, improved after initial diuresis 3.  Paroxysmal atrial fibrillation, on Eliquis for rate control 4.  Multifocal pneumonia, on  Rocephin and azithromycin, clinically much improved 5.  COPD  Recommendations  1.  Agree with current therapy 2.  Continue Eliquis for stroke prevention 3.  Continue diuresis 4.  Carefully monitor renal status 5.  Likely discharge home later today 6.  Follow-up as outpatient in 1 to 2 weeks   David Cowman, MD, PhD, [facility] 09/12/2022 9:28 AM

## 2022-09-12 NOTE — Plan of Care (Signed)
  Problem: Education: Goal: Ability to demonstrate management of disease process will improve Outcome: Progressing Goal: Ability to verbalize understanding of medication therapies will improve Outcome: Progressing   Problem: Activity: Goal: Capacity to carry out activities will improve Outcome: Progressing   Problem: Cardiac: Goal: Ability to achieve and maintain adequate cardiopulmonary perfusion will improve Outcome: Progressing   Problem: Activity: Goal: Ability to tolerate increased activity will improve Outcome: Progressing   Problem: Clinical Measurements: Goal: Ability to maintain a body temperature in the normal range will improve Outcome: Progressing   Problem: Respiratory: Goal: Ability to maintain adequate ventilation will improve Outcome: Progressing

## 2022-09-12 NOTE — Discharge Summary (Signed)
Physician Discharge Summary  David Hardin ZCH:[id number] DOB: [birth year] DOA: 09/08/2022  PCP: Derinda Late, MD  Admit date: 09/08/2022  Discharge date: 09/12/2022  Admitted From: Home. Disposition:  Home.  Recommendations for Outpatient Follow-up:  Follow up with PCP in 1-2 weeks. Please obtain BMP/CBC in one week. Patient is started on losartan, Farxiga for systolic CHF. Advised to take Omnicef 300 mg twice daily for 2 more days.  Home Health: None Equipment/Devices: None  Discharge Condition: Stable CODE STATUS: Full code Diet recommendation: Heart Healthy  Brief [facility] Course: This 88 years old male with PMH significant for paroxysmal A-fib on Eliquis, CAD s/p CABG, hypertension, hyperlipidemia, diastolic CHF with recent LVEF of 55% on echo on 05/12/2022, recently seen by his cardiologist 2 days ago with lower extremity edema and 7 pound weight gain.  His Lasix dose was increased.  Patient reports no improvement , So he presented in the ED.  Work-up in the ED reveals blood pressure 213/73,  troponin 33 > 187, BNP 545,  WBC 19 K, lactic acid 1.6.  CTA chest ruled out pulmonary embolism but shows cardiomegaly with findings consistent with CHF and multifocal pneumonia.  Patient was given Lasix 60 mg and started on azithromycin and Rocephin for community-acquired pneumonia.  Elevated troponin could be due to demand ischemia in the setting of multifocal pneumonia and acute on chronic CHF.  Heart rate remains reasonably controlled.  Cardiology was consulted.  Patient was adequately diuresed to euvolemic state.  Patient has taken antibiotics for 4 days.  Feels much better.  Cleared from cardiology to be discharged.  Patient has developed urinary retention required Foley catheter which was removed before discharge and patient successfully voided.  Patient is being discharged home on [place] for 2 more days.   Discharge Diagnoses:  Principal Problem:   Acute on chronic diastolic  CHF (congestive heart failure) (HCC) Active Problems:   Multifocal pneumonia   Paroxysmal atrial fibrillation (HCC)   Hypertensive urgency   Elevated troponin   Chronic anticoagulation   COPD (chronic obstructive pulmonary disease) (HCC)   CAD with Hx of CABG   Hypertension  Acute on chronic diastolic CHF: Patient presented with worsening shortness of breath, weight gain and lower extremity edema. He has recently seen his PCP and his Lasix dose was increased.  Patient did not see any improvement. Continue IV Lasix 40 mg every 12 hours. Monitor intake output charting, daily weight Echo from June shows LVEF 55%. Cardiology is consulted , recommended to continue diuresis. Repeat echo shows LVEF 60 to 65%.     Intake/Output Summary (Last 24 hours) at 09/11/2022 1608 Last data filed at 09/11/2022 1300    Gross per 24 hour  Intake 120 ml  Output 1901 ml  Net -1781 ml    Multifocal pneumonia: CT chest confirms multifocal pneumonia. Continue empiric antibiotics (Rocephin and azithromycin). Continue DuoNebs as needed, antitussives Wean on room air.  Patient is being discharged on [place] for 2 more days. Blood cultures no growth so far   Elevated troponins Suspect demand ischemia in the setting of CHF and pneumonia. Troponin 33> 185, denies any chest pain, EKG nonacute. Continue aspirin and Lipitor.  Not on metoprolol for unclear reasons.   Essential hypertension: Continue amlodipine. Started on losartan 50 mg dialy.   Paroxysmal atrial fibrillation: Heart rate is reasonably controlled.  Continue Eliquis. Not on rate controlling medications.   COPD (chronic obstructive pulmonary disease) Not currently exacerbated.  DuoNebs as needed     Obesity: Estimated  body mass index is 34.14 kg/m as calculated from the following:   Height as of this encounter: '5\' 7"'$  (1.702 m).   Weight as of this encounter: 98.9 kg.  Diet and exercise discussed in detail.  Discharge  Instructions  Discharge Instructions     Call MD for:  difficulty breathing, headache or visual disturbances   Complete by: As directed    Call MD for:  persistant dizziness or light-headedness   Complete by: As directed    Call MD for:  persistant nausea and vomiting   Complete by: As directed    Diet - low sodium heart healthy   Complete by: As directed    Diet Carb Modified   Complete by: As directed    Discharge instructions   Complete by: As directed    Advised to follow-up with primary care physician in 1 week. Advised to continue current medications. Patient is started on losartan, Farxiga for systolic CHF. Advised to take Omnicef 300 mg twice daily for 2 more days.   Increase activity slowly   Complete by: As directed       Allergies as of 09/12/2022   No Known Allergies      Medication List     STOP taking these medications    fluticasone furoate-vilanterol 100-25 MCG/INH Aepb Commonly known as: BREO ELLIPTA   predniSONE 10 MG tablet Commonly known as: DELTASONE   Robaxin 500 MG tablet Generic drug: methocarbamol       TAKE these medications    albuterol 108 (90 Base) MCG/ACT inhaler Commonly known as: VENTOLIN HFA Inhale 2 puffs into the lungs every 6 (six) hours as needed.   amLODipine 5 MG tablet Commonly known as: NORVASC Take 5 mg by mouth daily.   aspirin 81 MG chewable tablet Chew 81 mg by mouth daily.   atorvastatin 10 MG tablet Commonly known as: LIPITOR Take 10 mg by mouth daily. What changed: Another medication with the same name was removed. Continue taking this medication, and follow the directions you see here.   cefdinir 300 MG capsule Commonly known as: OMNICEF Take 1 capsule (300 mg total) by mouth 2 (two) times daily for 2 days.   dapagliflozin propanediol 10 MG Tabs tablet Commonly known as: FARXIGA Take 1 tablet (10 mg total) by mouth daily. Start taking on: September 13, 2022   Eliquis 5 MG Tabs tablet Generic drug:  apixaban Take 5 mg by mouth every 12 (twelve) hours.   fluticasone 50 MCG/ACT nasal spray Commonly known as: FLONASE Place 2 sprays into the nose daily as needed.   furosemide 40 MG tablet Commonly known as: LASIX Take 40 mg by mouth daily.   ipratropium-albuterol 0.5-2.5 (3) MG/3ML Soln Commonly known as: DUONEB Take 3 mLs by nebulization every 6 (six) hours as needed (shortness of breath, wheezing.).   losartan 50 MG tablet Commonly known as: COZAAR Take 1 tablet (50 mg total) by mouth daily. Start taking on: September 13, 2022   metolazone 5 MG tablet Commonly known as: ZAROXOLYN Take 5 mg by mouth daily. For 3 days starting 09-08-22.   montelukast 10 MG tablet Commonly known as: SINGULAIR Take 10 mg by mouth at bedtime.   PreserVision AREDS Caps Take 1 capsule by mouth 2 (two) times daily.   sildenafil 20 MG tablet Commonly known as: REVATIO   tamsulosin 0.4 MG Caps capsule Commonly known as: FLOMAX Take 0.4 mg by mouth daily. Take 1 capsule (0.4 mg total) by mouth once daily Take 30  minutes after same meal each day.   Trelegy Ellipta 100-62.5-25 MCG/ACT Aepb Generic drug: Fluticasone-Umeclidin-Vilant Inhale 1 puff into the lungs daily.        Follow-up Information     Derinda Late, MD Follow up in 1 week(s).   Specialty: Family Medicine Contact information: [address] [facility] [place] [address] [phone]         Isaias Cowman, MD Follow up in 1 week(s).   Specialty: Cardiology Contact information: [facility] [place] [address] [phone]                No Known Allergies  Consultations: Cardiology   Procedures/Studies: ECHOCARDIOGRAM COMPLETE  Result Date: 09/10/2022    ECHOCARDIOGRAM REPORT   Patient Name:   David Hardin Date of Exam: 09/10/2022 Medical Rec #:  [id number]     Height:       67.0 in Accession #:    [id number]     Weight:       218.0 lb Date of Birth:  12/10/1927    BSA:          2.098 m Patient Age:    88 years      BP:           132/49 mmHg Patient Gender: M             HR:           56 bpm. Exam Location:  [facility] Procedure: 2D Echo, Color Doppler and Cardiac Doppler Indications:     I50.31 congestive heart failure-Acute Diastolic  History:         Patient has no prior history of Echocardiogram examinations.                  CAD, COPD, Arrythmias:Atrial Fibrillation; Risk                  Factors:Hypertension and Dyslipidemia.  Sonographer:     Charmayne Sheer Referring Phys:  [place] Diagnosing Phys: [place]  1. Left ventricular ejection fraction, by estimation, is 60 to 65%. The left ventricle has normal function. The left ventricle demonstrates regional wall motion abnormalities (see scoring diagram/findings for description). The left ventricular internal cavity size was mildly dilated. There is mild left ventricular hypertrophy. Left ventricular diastolic parameters are indeterminate.  2. Right ventricular systolic function is mildly reduced. The right ventricular size is mildly enlarged. Mildly increased right ventricular wall thickness.  3. Left atrial size was moderately dilated.  4. Right atrial size was moderately dilated.  5. The mitral valve is grossly normal. Mild mitral valve regurgitation.  6. The aortic valve is calcified. Aortic valve regurgitation is mild. Aortic valve sclerosis/calcification is present, without any evidence of aortic stenosis. FINDINGS  Left Ventricle: Left ventricular ejection fraction, by estimation, is 60 to 65%. The left ventricle has normal function. The left ventricle demonstrates regional wall motion abnormalities. The left ventricular internal cavity size was mildly dilated. There is mild left ventricular hypertrophy. Left ventricular diastolic parameters are indeterminate. Right Ventricle: The right ventricular size is mildly enlarged. Mildly increased  right ventricular wall thickness. Right ventricular systolic function is mildly reduced. Left Atrium: Left atrial size was moderately dilated. Right Atrium: Right atrial size was moderately dilated. Pericardium: There is no evidence of pericardial effusion. Mitral Valve: The mitral valve is grossly normal. Mild mitral valve regurgitation. Tricuspid Valve: The tricuspid valve is grossly  normal. Tricuspid valve regurgitation is mild. Aortic Valve: The aortic valve is calcified. Aortic valve regurgitation is mild. Aortic valve sclerosis/calcification is present, without any evidence of aortic stenosis. Aortic valve mean gradient measures 5.0 mmHg. Aortic valve peak gradient measures 9.0 mmHg. Aortic valve area, by VTI measures 2.59 cm. Pulmonic Valve: The pulmonic valve was grossly normal. Pulmonic valve regurgitation is mild. Aorta: The aortic root, ascending aorta and aortic arch are all structurally normal, with no evidence of dilitation or obstruction. IAS/Shunts: No atrial level shunt detected by color flow Doppler.  LEFT VENTRICLE PLAX 2D LVIDd:         5.20 cm   Diastology LVIDs:         3.30 cm   LV e' medial:    7.51 cm/s LV PW:         1.10 cm   LV E/e' medial:  16.1 LV IVS:        1.30 cm   LV e' lateral:   7.62 cm/s LVOT diam:     2.10 cm   LV E/e' lateral: 15.8 LV SV:         75 LV SV Index:   36 LVOT Area:     3.46 cm  RIGHT VENTRICLE RV Basal diam:  3.80 cm RV S prime:     9.36 cm/s TAPSE (M-mode): 1.5 cm LEFT ATRIUM              Index        RIGHT ATRIUM           Index LA diam:        5.10 cm  2.43 cm/m   RA Area:     18.70 cm LA Vol (A2C):   100.0 ml 47.67 ml/m  RA Volume:   45.50 ml  21.69 ml/m LA Vol (A4C):   96.4 ml  45.96 ml/m LA Biplane Vol: 106.0 ml 50.53 ml/m  AORTIC VALVE                    PULMONIC VALVE AV Area (Vmax):    2.61 cm     PV Vmax:          1.05 m/s AV Area (Vmean):   2.46 cm     PV Peak grad:     4.4 mmHg AV Area (VTI):     2.59 cm     PR End Diast Vel: 4.93 msec AV  Vmax:           150.00 cm/s AV Vmean:          98.100 cm/s AV VTI:            0.289 m AV Peak Grad:      9.0 mmHg AV Mean Grad:      5.0 mmHg LVOT Vmax:         113.00 cm/s LVOT Vmean:        69.600 cm/s LVOT VTI:          0.216 m LVOT/AV VTI ratio: 0.75  AORTA Ao Root diam: 3.40 cm MITRAL VALVE MV Area (PHT): 3.56 cm     SHUNTS MV Decel Time: 213 msec     Systemic VTI:  0.22 m MV E velocity: 120.67 cm/s  Systemic Diam: 2.10 cm Neoma Laming Electronically signed by Neoma Laming Signature Date/Time: 09/10/2022/6:25:44 PM    Final    CT Angio Chest PE W and/or Wo Contrast  Result Date: 09/08/2022 CLINICAL DATA:  Shortness of breath  EXAM: CT ANGIOGRAPHY CHEST WITH CONTRAST TECHNIQUE: Multidetector CT imaging of the chest was performed using the standard protocol during bolus administration of intravenous contrast. Multiplanar CT image reconstructions and MIPs were obtained to evaluate the vascular anatomy. RADIATION DOSE REDUCTION: This exam was performed according to the departmental dose-optimization program which includes automated exposure control, adjustment of the mA and/or kV according to patient size and/or use of iterative reconstruction technique. CONTRAST:  56m OMNIPAQUE IOHEXOL 350 MG/ML SOLN COMPARISON:  Chest x-ray 09/08/2022, chest CT 04/26/2022 FINDINGS: Cardiovascular: Satisfactory opacification of the pulmonary arteries to the segmental level. No evidence of pulmonary embolism. Moderate aortic atherosclerosis. Aneurysmal dilatation of the ascending aorta wall up to 4.4 cm. Post sternotomy changes. Coronary vascular calcification. Cardiomegaly. No pericardial effusion Mediastinum/Nodes: Midline trachea. No thyroid mass. Esophagus within normal limits. Multiple mildly enlarged mediastinal lymph nodes. Prevascular node measures 8 mm. Right paratracheal node measures 14 mm. Left paratracheal lymph node measures 11 mm. Subcarinal lymph node measures 20 mm. Lungs/Pleura: Small bilateral pleural  effusions. Benign calcified right upper lobe pulmonary nodule. Multifocal areas of tree-in-bud density within the bilateral lungs with small areas of heterogeneous ground-glass density most evident in the lower lobes. Upper Abdomen: No acute abnormality. Musculoskeletal: No chest wall abnormality. No acute or significant osseous findings. Review of the MIP images confirms the above findings. IMPRESSION: 1. Negative for acute pulmonary embolus. 2. Cardiomegaly with small bilateral pleural effusions. 3. Multifocal bilateral areas of tree-in-bud density and heterogeneous ground-glass densities consistent with multifocal infection/pneumonia, to include atypical organisms. 4. Mild mediastinal adenopathy is probably reactive 5. Ascending aortic aneurysm up to 4.4 cm. Recommend annual imaging followup by CTA or MRA. This recommendation follows 2010 ACCF/AHA/AATS/ACR/ASA/SCA/SCAI/SIR/STS/SVM Guidelines for the Diagnosis and Management of Patients with Thoracic Aortic Disease. Circulation. 2010; 121:: [id number] Aortic aneurysm NOS (ICD10-I71.9) Aortic Atherosclerosis (ICD10-I70.0). Electronically Signed   By: KDonavan FoilM.D.   On: 09/08/2022 23:12   DG Chest Portable 1 View  Result Date: 09/08/2022 CLINICAL DATA:  Shortness of breath EXAM: PORTABLE CHEST 1 VIEW COMPARISON:  CT 04/26/2022, radiograph 12/11/2018, 12/14/2017 FINDINGS: Cardiomegaly with central vascular congestion. Aortic atherosclerosis. Postsurgical changes of the mediastinum. No pleural effusion or pneumothorax. IMPRESSION: Cardiomegaly with mild central congestion. Electronically Signed   By: KDonavan FoilM.D.   On: 09/08/2022 21:45      Subjective: Patient was seen and examined at bedside.  Overnight events noted.   Patient reports feeling much improved and wants to be discharged.  He is weaned down to room air.  Discharge Exam: Vitals:   09/12/22 1230 09/12/22 1522  BP: (!) 155/67 128/66  Pulse: (!) 53 61  Resp: 17 18  Temp: 97.7 F  (36.5 C)   SpO2: 94%    Vitals:   09/12/22 0453 09/12/22 0740 09/12/22 1230 09/12/22 1522  BP: (!) 156/88 (!) 139/59 (!) 155/67 128/66  Pulse: 68 (!) 57 (!) 53 61  Resp: '16  17 18  '$ Temp: (!) 97.4 F (36.3 C) 98.1 F (36.7 C) 97.7 F (36.5 C)   TempSrc: Oral  Oral   SpO2: 90% 91% 94%   Weight:      Height:        General: Pt is alert, awake, not in acute distress Cardiovascular: RRR, S1/S2 +, no rubs, no gallops Respiratory: CTA bilaterally, no wheezing, no rhonchi Abdominal: Soft, NT, ND, bowel sounds + Extremities: no edema, no cyanosis    The results of significant diagnostics from this hospitalization (including imaging, microbiology, ancillary and laboratory) are  listed below for reference.     Microbiology: Recent Results (from the past 240 hour(s))  Blood culture (routine x 2)     Status: None (Preliminary result)   Collection Time: 09/08/22 11:50 PM   Specimen: BLOOD  Result Value Ref Range Status   Specimen Description BLOOD BLOOD RIGHT HAND  Final   Special Requests   Final    BOTTLES DRAWN AEROBIC AND ANAEROBIC Blood Culture results may not be optimal due to an excessive volume of blood received in culture bottles   Culture   Final    NO GROWTH 3 DAYS Performed at [facility], [address], [place], [place] [address]    Report Status PENDING  Incomplete  Resp Panel by RT-PCR (Flu A&B, Covid) Anterior Nasal Swab     Status: None   Collection Time: 09/09/22 12:03 AM   Specimen: Anterior Nasal Swab  Result Value Ref Range Status   SARS Coronavirus 2 by RT PCR NEGATIVE NEGATIVE Final    Comment: (NOTE) SARS-CoV-2 target nucleic acids are NOT DETECTED.  The SARS-CoV-2 RNA is generally detectable in upper respiratory specimens during the acute phase of infection. The lowest concentration of SARS-CoV-2 viral copies this assay can detect is 138 copies/mL. A negative result does not preclude SARS-Cov-2 infection and should not be used as the sole  basis for treatment or other patient management decisions. A negative result may occur with  improper specimen collection/handling, submission of specimen other than nasopharyngeal swab, presence of viral mutation(s) within the areas targeted by this assay, and inadequate number of viral copies(<138 copies/mL). A negative result must be combined with clinical observations, patient history, and epidemiological information. The expected result is Negative.  Fact Sheet for Patients:  EntrepreneurPulse.com.au  Fact Sheet for Healthcare Providers:  IncredibleEmployment.be  This test is no t yet approved or cleared by the [place] FDA and  has been authorized for detection and/or diagnosis of SARS-CoV-2 by FDA under an Emergency Use Authorization (EUA). This EUA will remain  in effect (meaning this test can be used) for the duration of the COVID-19 declaration under Section 564(b)(1) of the Act, 21 U.S.C.section 360bbb-3(b)(1), unless the authorization is terminated  or revoked sooner.       Influenza A by PCR NEGATIVE NEGATIVE Final   Influenza B by PCR NEGATIVE NEGATIVE Final    Comment: (NOTE) The Xpert Xpress SARS-CoV-2/FLU/RSV plus assay is intended as an aid in the diagnosis of influenza from Nasopharyngeal swab specimens and should not be used as a sole basis for treatment. Nasal washings and aspirates are unacceptable for Xpert Xpress SARS-CoV-2/FLU/RSV testing.  Fact Sheet for Patients: EntrepreneurPulse.com.au  Fact Sheet for Healthcare Providers: IncredibleEmployment.be  This test is not yet approved or cleared by the [place] FDA and has been authorized for detection and/or diagnosis of SARS-CoV-2 by FDA under an Emergency Use Authorization (EUA). This EUA will remain in effect (meaning this test can be used) for the duration of the COVID-19 declaration under Section 564(b)(1) of the Act,  21 U.S.C. section 360bbb-3(b)(1), unless the authorization is terminated or revoked.  Performed at [facility], [address] [place], [place] [address]   Blood culture (routine x 2)     Status: None (Preliminary result)   Collection Time: 09/09/22 12:03 AM   Specimen: BLOOD  Result Value Ref Range Status   Specimen Description BLOOD RIGHT ANTECUBITAL  Final   Special Requests   Final    BOTTLES DRAWN AEROBIC AND ANAEROBIC Blood  Culture results may not be optimal due to an excessive volume of blood received in culture bottles   Culture   Final    NO GROWTH 3 DAYS Performed at [facility], [address] [place], [place] [address]    Report Status PENDING  Incomplete     Labs: BNP (last 3 results) Recent Labs    04/20/22 1141 09/08/22 2119  BNP 369.3* 950.9*   Basic Metabolic Panel: Recent Labs  Lab 09/08/22 2119 09/10/22 0436 09/11/22 0535 09/12/22 0545  NA 140 142 139 141  K 3.1* 2.8* 3.4* 3.3*  CL 101 99 98 100  CO2 27 32 30 32  GLUCOSE 136* 105* 91 103*  BUN 17 24* 24* 24*  CREATININE 0.97 1.07 1.06 1.08  CALCIUM 8.6* 8.6* 8.5* 8.7*  MG  --  1.9 2.0 2.0  PHOS  --  3.7 4.1 3.4   Liver Function Tests: No results for input(s): "AST", "ALT", "ALKPHOS", "BILITOT", "PROT", "ALBUMIN" in the last 168 hours. No results for input(s): "LIPASE", "AMYLASE" in the last 168 hours. No results for input(s): "AMMONIA" in the last 168 hours. CBC: Recent Labs  Lab 09/08/22 2119 09/10/22 0436 09/11/22 0535 09/12/22 0545  WBC 19.6* 17.2* 14.6* 13.5*  NEUTROABS 17.0*  --   --   --   HGB 14.3 13.8 14.3 14.7  HCT 43.4 41.4 42.9 44.8  MCV 92.5 90.2 92.1 90.7  PLT 672* 591* 548* 595*   Cardiac Enzymes: No results for input(s): "CKTOTAL", "CKMB", "CKMBINDEX", "TROPONINI" in the last 168 hours. BNP: Invalid input(s): "POCBNP" CBG: No results for input(s): "GLUCAP" in the last 168 hours. D-Dimer No results for input(s): "DDIMER" in the last 72  hours. Hgb A1c No results for input(s): "HGBA1C" in the last 72 hours. Lipid Profile No results for input(s): "CHOL", "HDL", "LDLCALC", "TRIG", "CHOLHDL", "LDLDIRECT" in the last 72 hours. Thyroid function studies No results for input(s): "TSH", "T4TOTAL", "T3FREE", "THYROIDAB" in the last 72 hours.  Invalid input(s): "FREET3" Anemia work up No results for input(s): "VITAMINB12", "FOLATE", "FERRITIN", "TIBC", "IRON", "RETICCTPCT" in the last 72 hours. Urinalysis    Component Value Date/Time   COLORURINE AMBER (A) 12/14/2017 1133   APPEARANCEUR CLEAR (A) 12/14/2017 1133   APPEARANCEUR Clear 02/05/2014 1023   LABSPEC 1.026 12/14/2017 1133   LABSPEC 1.015 02/05/2014 1023   PHURINE 5.0 12/14/2017 1133   GLUCOSEU >=500 (A) 12/14/2017 1133   GLUCOSEU Negative 02/05/2014 1023   HGBUR NEGATIVE 12/14/2017 1133   BILIRUBINUR NEGATIVE 12/14/2017 1133   BILIRUBINUR 1+ 02/05/2014 1023   KETONESUR NEGATIVE 12/14/2017 1133   PROTEINUR 30 (A) 12/14/2017 1133   NITRITE NEGATIVE 12/14/2017 1133   LEUKOCYTESUR NEGATIVE 12/14/2017 1133   LEUKOCYTESUR Negative 02/05/2014 1023   Sepsis Labs Recent Labs  Lab 09/08/22 2119 09/10/22 0436 09/11/22 0535 09/12/22 0545  WBC 19.6* 17.2* 14.6* 13.5*   Microbiology Recent Results (from the past 240 hour(s))  Blood culture (routine x 2)     Status: None (Preliminary result)   Collection Time: 09/08/22 11:50 PM   Specimen: BLOOD  Result Value Ref Range Status   Specimen Description BLOOD BLOOD RIGHT HAND  Final   Special Requests   Final    BOTTLES DRAWN AEROBIC AND ANAEROBIC Blood Culture results may not be optimal due to an excessive volume of blood received in culture bottles   Culture   Final    NO GROWTH 3 DAYS Performed at [facility], [address], [place], [place] [address]  Report Status PENDING  Incomplete  Resp Panel by RT-PCR (Flu A&B, Covid) Anterior Nasal Swab     Status: None   Collection Time: 09/09/22 12:03 AM    Specimen: Anterior Nasal Swab  Result Value Ref Range Status   SARS Coronavirus 2 by RT PCR NEGATIVE NEGATIVE Final    Comment: (NOTE) SARS-CoV-2 target nucleic acids are NOT DETECTED.  The SARS-CoV-2 RNA is generally detectable in upper respiratory specimens during the acute phase of infection. The lowest concentration of SARS-CoV-2 viral copies this assay can detect is 138 copies/mL. A negative result does not preclude SARS-Cov-2 infection and should not be used as the sole basis for treatment or other patient management decisions. A negative result may occur with  improper specimen collection/handling, submission of specimen other than nasopharyngeal swab, presence of viral mutation(s) within the areas targeted by this assay, and inadequate number of viral copies(<138 copies/mL). A negative result must be combined with clinical observations, patient history, and epidemiological information. The expected result is Negative.  Fact Sheet for Patients:  EntrepreneurPulse.com.au  Fact Sheet for Healthcare Providers:  IncredibleEmployment.be  This test is no t yet approved or cleared by the [place] FDA and  has been authorized for detection and/or diagnosis of SARS-CoV-2 by FDA under an Emergency Use Authorization (EUA). This EUA will remain  in effect (meaning this test can be used) for the duration of the COVID-19 declaration under Section 564(b)(1) of the Act, 21 U.S.C.section 360bbb-3(b)(1), unless the authorization is terminated  or revoked sooner.       Influenza A by PCR NEGATIVE NEGATIVE Final   Influenza B by PCR NEGATIVE NEGATIVE Final    Comment: (NOTE) The Xpert Xpress SARS-CoV-2/FLU/RSV plus assay is intended as an aid in the diagnosis of influenza from Nasopharyngeal swab specimens and should not be used as a sole basis for treatment. Nasal washings and aspirates are unacceptable for Xpert Xpress  SARS-CoV-2/FLU/RSV testing.  Fact Sheet for Patients: EntrepreneurPulse.com.au  Fact Sheet for Healthcare Providers: IncredibleEmployment.be  This test is not yet approved or cleared by the [place] FDA and has been authorized for detection and/or diagnosis of SARS-CoV-2 by FDA under an Emergency Use Authorization (EUA). This EUA will remain in effect (meaning this test can be used) for the duration of the COVID-19 declaration under Section 564(b)(1) of the Act, 21 U.S.C. section 360bbb-3(b)(1), unless the authorization is terminated or revoked.  Performed at [facility], [address] [place], [place] [address]   Blood culture (routine x 2)     Status: None (Preliminary result)   Collection Time: 09/09/22 12:03 AM   Specimen: BLOOD  Result Value Ref Range Status   Specimen Description BLOOD RIGHT ANTECUBITAL  Final   Special Requests   Final    BOTTLES DRAWN AEROBIC AND ANAEROBIC Blood Culture results may not be optimal due to an excessive volume of blood received in culture bottles   Culture   Final    NO GROWTH 3 DAYS Performed at [facility], [address], [place], [place] [address]    Report Status PENDING  Incomplete     Time coordinating discharge: Over 30 minutes  SIGNED:   Shawna Clamp, MD  [facility] 09/12/2022, 3:40 PM Pager   If 7PM-7AM, please contact night-coverage

## 2022-09-12 NOTE — Progress Notes (Signed)
  Transition of Care [facility]) Screening Note   Patient Details  Name: [name] Date of Birth: [birth year]   Transition of Care [facility]) CM/SW Contact:    Alberteen Sam, LCSW Phone Number: 09/12/2022, 3:11 PM    Transition of Care Department [facility]) has reviewed patient and no TOC needs have been identified at this time. We will continue to monitor patient advancement through interdisciplinary progression rounds. If new patient transition needs arise, please place a TOC consult.  [place], [place]

## 2022-09-14 LAB — CULTURE, BLOOD (ROUTINE X 2)
Culture: NO GROWTH
Culture: NO GROWTH

## 2022-09-17 NOTE — Progress Notes (Unsigned)
   Patient ID: NEHEMIAS SAUCEDA, male    DOB: 1927-10-28, 88 y.o.   MRN: [id number]  HPI  Mr Colligan is a 88 y/o male with a history of  Echo report from 09/10/22 reviewed and showed an EF of 60-65% along with mild LVH, moderate LAE, moderate RAE and mild MR/AR.   Was in the ED 09/13/22 due to urinary retention after foley removed yesterday. Foley catheter replaced and symptoms improved and he was released. Admitted 09/08/22 due to weight gain and lower extremity edema that didn't improve after lasix increased. Chest CTA negative for PE but positive for HF and pneumonia. Cardiology consult obtained. Initially given IV lasix/ antibiotics with transition to oral medications. Foley placed for urinary retention with subsequent removal and successful voiding. Discharged after 4 days.   He presents today for his initial visit with a chief complaint of   Review of Systems    Physical Exam    Assessment & Plan:  1: Chronic heart failure with preserved ejection fraction with structural change (LVH)- - NYHA - BNP 09/08/22 was 544.8  2: HTN- - BP - saw PCP Baldemar Lenis) 08/28/22 - BMP 09/13/22 reviewed and showed sodium 142, potassium 3.8, creatinine 1.16 and GFR 59  3: Atrial fibrillation- - saw cardiology (Paraschos) 09/07/22  4: COPD- - saw pulmonology Raul Del) 07/28/22  5: Urinary retention- - recent ED visit with insertion of foley catheter

## 2022-09-18 ENCOUNTER — Ambulatory Visit: Payer: Medicare HMO | Attending: Family | Admitting: Family

## 2022-09-18 ENCOUNTER — Encounter: Payer: Self-pay | Admitting: Family

## 2022-09-18 VITALS — BP 135/57 | HR 61 | Resp 14 | Ht 67.0 in | Wt 199.5 lb

## 2022-09-18 DIAGNOSIS — J449 Chronic obstructive pulmonary disease, unspecified: Secondary | ICD-10-CM | POA: Diagnosis not present

## 2022-09-18 DIAGNOSIS — I5032 Chronic diastolic (congestive) heart failure: Secondary | ICD-10-CM | POA: Diagnosis not present

## 2022-09-18 DIAGNOSIS — Z7984 Long term (current) use of oral hypoglycemic drugs: Secondary | ICD-10-CM | POA: Insufficient documentation

## 2022-09-18 DIAGNOSIS — Z7901 Long term (current) use of anticoagulants: Secondary | ICD-10-CM | POA: Diagnosis not present

## 2022-09-18 DIAGNOSIS — I480 Paroxysmal atrial fibrillation: Secondary | ICD-10-CM

## 2022-09-18 DIAGNOSIS — E785 Hyperlipidemia, unspecified: Secondary | ICD-10-CM | POA: Insufficient documentation

## 2022-09-18 DIAGNOSIS — I2510 Atherosclerotic heart disease of native coronary artery without angina pectoris: Secondary | ICD-10-CM | POA: Diagnosis not present

## 2022-09-18 DIAGNOSIS — Z87891 Personal history of nicotine dependence: Secondary | ICD-10-CM | POA: Insufficient documentation

## 2022-09-18 DIAGNOSIS — I110 Hypertensive heart disease with heart failure: Secondary | ICD-10-CM | POA: Diagnosis present

## 2022-09-18 DIAGNOSIS — I10 Essential (primary) hypertension: Secondary | ICD-10-CM | POA: Diagnosis not present

## 2022-09-18 DIAGNOSIS — R339 Retention of urine, unspecified: Secondary | ICD-10-CM

## 2022-09-18 NOTE — Patient Instructions (Signed)
Continue weighing daily and call for an overnight weight gain of 3 pounds or more or a weekly weight gain of more than 5 pounds.   If you have voicemail, please make sure your mailbox is cleaned out so that we may leave a message and please make sure to listen to any voicemails.     

## 2022-09-19 ENCOUNTER — Other Ambulatory Visit: Payer: Self-pay | Admitting: Urology

## 2022-09-19 DIAGNOSIS — R972 Elevated prostate specific antigen [PSA]: Secondary | ICD-10-CM

## 2022-09-27 ENCOUNTER — Encounter: Payer: Self-pay | Admitting: Urology

## 2022-10-03 ENCOUNTER — Ambulatory Visit
Admission: RE | Admit: 2022-10-03 | Discharge: 2022-10-03 | Disposition: A | Discharge status: Home / Self Care | Payer: Medicare HMO | Source: Ambulatory Visit | Attending: Urology | Admitting: Urology

## 2022-10-03 DIAGNOSIS — R972 Elevated prostate specific antigen [PSA]: Secondary | ICD-10-CM | POA: Diagnosis present

## 2022-10-03 MED ORDER — GADOBUTROL 1 MMOL/ML IV SOLN
9.0000 mL | Freq: Once | INTRAVENOUS | Status: AC | PRN
  Administered 2022-10-03: 9 mL via INTRAVENOUS

## 2022-10-12 ENCOUNTER — Encounter
Admission: RE | Admit: 2022-10-12 | Discharge: 2022-10-12 | Disposition: A | Discharge status: Home / Self Care | Payer: Medicare HMO | Source: Ambulatory Visit | Attending: Urology | Admitting: Urology

## 2022-10-12 ENCOUNTER — Other Ambulatory Visit: Payer: Self-pay

## 2022-10-12 HISTORY — DX: Personal history of urinary calculi: Z87.442

## 2022-10-12 HISTORY — DX: Pneumonia, unspecified organism: J18.9

## 2022-10-12 NOTE — Patient Instructions (Addendum)
Your procedure is scheduled on: 10/19/22 - Thursday Report to the Registration Desk on the [facility]st [facility]. To find out your arrival time, please call [phone] between 1PM - 3PM on: 10/18/22 - Wednesday If your arrival time is 6:00 am, do not arrive prior to that time as the [facility] entrance doors do not open until 6:00 am.  REMEMBER: Instructions that are not followed completely may result in serious medical risk, up to and including death; or upon the discretion of your surgeon and anesthesiologist your surgery may need to be rescheduled.  Do not eat food after midnight the night before surgery.  No gum chewing, lozengers or hard candies.  You may however, drink CLEAR liquids up to 2 hours before you are scheduled to arrive for your surgery. Do not drink anything within 2 hours of your scheduled arrival time.  Clear liquids include: - water  - apple juice without pulp - gatorade (not RED colors) - black coffee or tea (Do NOT add milk or creamers to the coffee or tea) Do NOT drink anything that is not on this list.  TAKE THESE MEDICATIONS THE MORNING OF SURGERY WITH A SIP OF WATER:  - albuterol (VENTOLIN HFA)  - amLODipine (NORVASC)  - atorvastatin (LIPITOR)  - TRELEGY ELLIPTA   HOLD Eliquis 2 days prior to your procedure beginning 10/17/22.  Hold Aspirin 7 days prior to your procedure beginning 10/12/22.  HOLD Farxiga 3 days prior to your procedure beginning 10/16/22.  One week prior to surgery: Stop Anti-inflammatories (NSAIDS) such as Advil, Aleve, Ibuprofen, Motrin, Naproxen, Naprosyn and Aspirin based products such as Excedrin, Goodys Powder, BC Powder.  Stop ANY OVER THE COUNTER supplements until after surgery.  You may take Tylenol if needed for pain up until the day of surgery.  No Alcohol for 24 hours before or after surgery.  No Smoking including e-cigarettes for 24 hours prior to surgery.  No chewable tobacco products for at least 6  hours prior to surgery.  No nicotine patches on the day of surgery.  Do not use any "recreational" drugs for at least a week prior to your surgery.  Please be advised that the combination of cocaine and anesthesia may have negative outcomes, up to and including death. If you test positive for cocaine, your surgery will be cancelled.  On the morning of surgery brush your teeth with toothpaste and water, you may rinse your mouth with mouthwash if you wish. Do not swallow any toothpaste or mouthwash.  Do not wear jewelry, make-up, hairpins, clips or nail polish.  Do not wear lotions, powders, or perfumes.   Do not shave body from the neck down 48 hours prior to surgery just in case you cut yourself which could leave a site for infection. Also, freshly shaved skin may become irritated if using the CHG soap.  Contact lenses, hearing aids and dentures may not be worn into surgery.  Do not bring valuables to the hospital. [facility] is not responsible for any missing/lost belongings or valuables.   Fleets Enema x 2 - complete 1 on the night before your procedure as directed and complete one 2 hours before your procedure until clear.  Notify your doctor if there is any change in your medical condition (cold, fever, infection).  Wear comfortable clothing (specific to your surgery type) to the hospital.  After surgery, you can help prevent lung complications by doing breathing exercises.  Take deep breaths and cough every 1-2 hours.  Your doctor may order a device called an Incentive Spirometer to help you take deep breaths. When coughing or sneezing, hold a pillow firmly against your incision with both hands. This is called "splinting." Doing this helps protect your incision. It also decreases belly discomfort.  If you are being admitted to the hospital overnight, leave your suitcase in the car. After surgery it may be brought to your room.  If you are being discharged the day of surgery, you  will not be allowed to drive home. You will need a responsible adult (18 years or older) to drive you home and stay with you that night.   If you are taking public transportation, you will need to have a responsible adult (18 years or older) with you. Please confirm with your physician that it is acceptable to use public transportation.   Please call the [facility] Dept. at [phone] if you have any questions about these instructions.  Surgery Visitation Policy:  Patients undergoing a surgery or procedure may have two family members or support persons with them as long as the person is not COVID-19 positive or experiencing its symptoms.   Inpatient Visitation:    Visiting hours are 7 a.m. to 8 p.m. Up to four visitors are allowed at one time in a patient room, including children. The visitors may rotate out with other people during the day. One designated support person (adult) may remain overnight.

## 2022-10-13 NOTE — H&P (Unsigned)
NAME: David Hardin, David Hardin MEDICAL RECORD NO: [id number] ACCOUNT NO: [id number] DATE OF BIRTH: [birth year] FACILITY: [facility] LOCATION: [facility] PHYSICIAN: Otelia Limes. Yves Dill, MD  History and Physical   DATE OF ADMISSION: 10/19/2022  Same day surgery 11/09.  CHIEF COMPLAINT:  Elevated PSA and abnormal prostate gland MRI scan.  HISTORY OF PRESENT ILLNESS:  The patient is a 88 year old white male who initially presented with urinary retention, but has been able to void successfully since he has been on tamsulosin and dutasteride.  He was found to have a PSA level of 16.1 ng/mL.   This was evaluated with MRI scan 10/24 indicating prostate volume of 123 mL with a TI-RADS category 4 lesion on the left side and measuring 1.3 x 0.6 x 0.7 cm as well as a TI-RADS category 3 lesion on the right side measuring 1.3 x 1.1 x 0.6 cm.  He  comes in now for UroNav fusion biopsy of the prostate.  He may have had a prostate biopsy many years ago in [place], but those records are not available to me.  He does recall that the biopsy was done in office setting without UroNav guidance and  without prior MRI scan and was benign.  PAST MEDICAL HISTORY:  ALLERGIES:  No drug allergies.  CURRENT MEDICATIONS:  Included albuterol, amlodipine, Eliquis, aspirin, atorvastatin, Breo Ellipta, Flonase, Lasix, probiotics, Zaroxolyn, montelukast, PreserVision AREDS, potassium chloride, sildenafil, tamsulosin, dutasteride, zinc and DuoNeb.  SURGICAL HISTORY: 1.  Coronary artery bypass graft x2 in 2013. 2.  Joint replacement 2006. 3.  Prostate biopsies. 4.  Colonoscopy 2020.  PAST AND CURRENT MEDICAL CONDITIONS: 1.  Atrial fibrillation. 2.  Coronary artery disease, status post bypass surgery. 3.  Hypertension. 4.  Hyperlipidemia. 5.  COPD. 6.  Congestive heart failure. 7.  Bradycardia.  REVIEW OF SYSTEMS:  The patient complains of decreased auditory acuity.  He has allergic rhinitis.  He denies chest pain or shortness of  breath.  He denied diabetes.  SOCIAL HISTORY:  The patient quit smoking in 2013 with a 50-pack-year history.  He consumes five alcoholic beverages per week.  FAMILY HISTORY:  Father died at age 68 of heart disease.  Sister died at age 73 of breast cancer.  The patient has a brother who died at age 86 of heart disease.  The brother also had renal cell carcinoma and prostate cancer.  He has a sister who died at  age 24 of leukemia.  PHYSICAL EXAMINATION: VITAL SIGNS:  Weight was 198 pounds, height 5 feet 7 inches. GENERAL:  Well-nourished white male in no distress. HEENT:  Sclerae were clear.  Pupils are equal, round, reactive to light and accommodation.  Extraocular movements are intact. NECK:  No palpable masses or tenderness. LYMPHATIC:  No palpable inguinal or cervical adenopathy. PULMONARY:  Lungs clear to auscultation. CARDIOVASCULAR:  Regular rhythm and rate. ABDOMEN:  Soft, nontender abdomen.  No CVA tenderness. GENITOURINARY:  Uncircumcised.  Testes smooth, nontender, atrophic. Approximately 16 mL in size each. RECTAL:  A 35 gram generally nodular prostate, more prominent nodularity on the left side. NEUROMUSCULAR:  Grossly intact.  IMPRESSION: 1.  Elevated PSA. 2.  Abnormal prostate gland on MRI scan. 3.  Significant BPH with recent urinary retention.  PLAN:  UroNav fusion biopsy of the prostate.   NIK D: 10/13/2022 9:45:03 am T: 10/13/2022 10:23:00 am  JOB: [id number]/ [id number]

## 2022-10-16 ENCOUNTER — Encounter: Payer: Self-pay | Admitting: Urology

## 2022-10-16 NOTE — Progress Notes (Signed)
Perioperative Services  Pre-Admission/Anesthesia Testing Clinical Review  Date: 10/17/22  Patient Demographics:  Name: David Hardin DOB:   Mar 04, 1927 MRN:   [id number]  Planned Surgical Procedure(s):    Case: [id number] Date/Time: 10/19/22 0855   Procedure: PROSTATE BIOPSY URONAV   Anesthesia type: Choice   Pre-op diagnosis: ELEVATED PSA   Location: [place] [facility] / [place] [facility] FOR ANESTHESIA GROUP   Surgeons: Royston Cowper, MD   NOTE: Available PAT nursing documentation and vital signs have been reviewed. Clinical nursing staff has updated patient's PMH/PSHx, current medication list, and drug allergies/intolerances to ensure comprehensive history available to assist in medical decision making as it pertains to the aforementioned surgical procedure and anticipated anesthetic course. Extensive review of available clinical information performed.  PMH and PSHx updated with any diagnoses/procedures that  may have been inadvertently omitted during his intake with the pre-admission testing department's nursing staff.  Clinical Discussion:  David Hardin is a 88 y.o. male who is submitted for pre-surgical anesthesia review and clearance prior to him undergoing the above procedure. Patient is a Former Smoker (quit 2015). Pertinent PMH includes: CAD (s/p MIDCAB), NSTEMI, atrial fibrillation, HFpEF, TAA, bradycardia, cardiomegaly, peripheral edema, aortic atherosclerosis, HTN, HLD, DOE, COPD, OA, lumbar DDD, ED (on PDE5i), elevated PSA, nephrolithiasis.  Patient is followed by cardiology Saralyn Pilar, MD). He was last seen in the cardiology clinic on 10/12/2022; notes reviewed.  At the time of his clinic visit, patient reported to be doing well overall from a cardiovascular perspective.  He was recently admitted here at [facility] on 09/08/2022 with acute on chronic diastolic heart failure, multifocal pneumonia, and atrial fibrillation.  Since discharge,  patient denies any episodes of chest pain, shortness breath, PND, orthopnea, palpitations, significant peripheral edema, vertiginous symptoms, or presyncope/syncope.  Patient with a past medical history significant for cardiovascular diagnoses.  Patient suffered an NSTEMI on 05/02/2012.  He subsequently underwent diagnostic LEFT heart c.  Atheterization on 05/03/2012 revealing a normal left ventricular systolic function with an EF of 56%.  There was apical dyskinesis.  Multivessel CAD noted; 99% mid LAD, 70% D1-1, 75% D1-2, 40% mid LCx, 50% proximal RCA, 99% distal RCA, and 99% RPDA.  Given the complexity of patient's disease, the decision was made to refer to CVTS for consideration of revascularization procedure.  Patient underwent two-vessel MIDCAB procedure on 05/13/2012 at [facility].  LIMA-LAD and SVG-PDA bypass grafts were placed.  Myocardial perfusion imaging study was performed on 05/09/2017 revealing a normal left ventricular systolic function with an EF of 61%.  There were no wall motion abnormalities noted.  There was no evidence of stress-induced myocardial ischemia or arrhythmia; no scintigraphic evidence of scar.  Study determined to be normal and low risk.  Most recent TTE was performed on 09/10/2022 revealing a normal left ventricular systolic function with an EF of 60-65%.  There were no regional wall motion abnormalities noted.  Left ventricular internal cavity size was mildly dilated.  There was mild LVH.  Left ventricular diastolic parameters indeterminant.  Right ventricular systolic function mildly reduced and size mildly enlarged.  There was moderate biatrial enlargement.  Mild mitral valve and aortic valve regurgitation noted.  Aortic valve also noted to be sclerotic with calcifications present.  There was no evidence of significant transvalvular gradient to suggest stenosis.  Again, patient has an atrial fibrillation diagnosis; CHA2DS2-VASc Score = 5 (age x 2,  HFpEF, HTN, NSTEMI). His rate and rhythm are currently being  maintained without pharmacological intervention. He is chronically anticoagulated using apixaban; reported to be compliant with therapy with no evidence or reports of GI bleeding.  Blood pressure reasonably controlled at 132/68 mmHg on currently prescribed CCB (amlodipine), diuretic (furosemide), and ARB (losartan) therapies. He is on a atorvastatin for his HLD diagnosis and further ASCVD prevention.  Additionally, patient is on an SGLT2i (dapagliflozin) for treatment of his HFrEF.  Of note, it is important to note that patient, in the setting of known cardiovascular diagnoses, is on a prescribed PDE5i (sildenafil).  He is not diabetic. Patient does not have an OSAH diagnosis.  Functional capacity is limited by patient's age and multiple medical comorbidities.  Patient questionably able to achieve 4 METS of physical activity without experiencing any angina/anginal equivalent symptoms.  No changes were made to his medication regimen.  Patient follow-up with outpatient cardiology in 3 months or sooner if needed.  David Hardin is scheduled for a PROSTATE BIOPSY URONAV on 10/19/2022 with Dr. Maryan Puls, MD.  Given patient's past medical history significant for cardiovascular diagnoses, presurgical cardiac clearance was sought by the PAT team. Per cardiology, "this patient is optimized for surgery and may proceed with the planned procedural course with an ACCEPTABLE risk of significant perioperative cardiovascular complications".  Again, this patient is on daily anticoagulation therapy.  He has been instructed on recommendations from his cardiologist for holding his apixaban for 2 days prior to his procedure with plans to restart since postoperative bleeding respectively minimized by his primary attending surgeon.  He is aware that his last dose of apixaban should be on 10/16/2022.  Additionally, patient will hold his daily low-dose ASA for 7 days prior  to his procedure (last dose 10/11/2022).  Patient denies previous perioperative complications with anesthesia in the past. In review of the available records, it is noted that patient underwent a general anesthetic course here at [facility] (ASA III) in 09/2019 without documented complications.      09/18/2022   10:27 AM 09/12/2022    3:22 PM 09/12/2022   12:30 PM  Vitals with BMI  Height _0     Weight 199 lbs 8 oz    BMI 70.17    Systolic 793 903 009  Diastolic 57 66 67  Pulse 61 61 53    Providers/Specialists:   NOTE: Primary physician provider listed below. Patient may have been seen by APP or partner within same practice.   PROVIDER ROLE / SPECIALTY LAST Towana Badger, MD Urology (Surgeon) 10/13/2022  Derinda Late, MD Primary Care Provider 08/28/2022  Isaias Cowman, MD Cardiology 10/12/2022   Allergies:  Patient has no known allergies.  Current Home Medications:   No current facility-administered medications for this encounter.    albuterol (VENTOLIN HFA) 108 (90 Base) MCG/ACT inhaler   amLODipine (NORVASC) 5 MG tablet   apixaban (ELIQUIS) 5 MG TABS tablet   aspirin 81 MG chewable tablet   atorvastatin (LIPITOR) 10 MG tablet   dapagliflozin propanediol (FARXIGA) 10 MG TABS tablet   fluticasone (FLONASE) 50 MCG/ACT nasal spray   furosemide (LASIX) 40 MG tablet   ipratropium-albuterol (DUONEB) 0.5-2.5 (3) MG/3ML SOLN   losartan (COZAAR) 50 MG tablet   metolazone (ZAROXOLYN) 5 MG tablet   montelukast (SINGULAIR) 10 MG tablet   Multiple Vitamins-Minerals (PRESERVISION AREDS) CAPS   sildenafil (REVATIO) 20 MG tablet   tamsulosin (FLOMAX) 0.4 MG CAPS capsule   TRELEGY ELLIPTA 100-62.5-25 MCG/ACT AEPB   History:   Past  Medical History:  Diagnosis Date   (HFpEF) heart failure with preserved ejection fraction ([place]) 05/03/2012   a.) TTE 05/03/2012: EF 50-55%, apical AK, mild AR; b.) TTE 05/11/2022: EF >55%, MAC, mod  BAE, mod RVE, triv AR/PR, mild MR/TR; c.) TTE 09/10/2022: EF 60-65%, mild LVH, mod BAE, mild MR, AoV sclerosis without stenosis   Aortic atherosclerosis (HCC)    Atrial fibrillation (HCC)    a.) CHA2DS2VASc = 5 (age x2, HFpEF, HTN, NSTEMI);  b.) rate/rhythm maintained without pharmacological intervention; chronically anticoagulated with apixaban   BPH (benign prostatic hyperplasia)    Bradycardia    Cardiomegaly    COPD (chronic obstructive pulmonary disease) ([place])    Coronary artery disease 05/03/2012   a.) LHC 05/03/2012:  EF 56%, apical dyskinesis, 99% mLAD, 70% D1-1, 75% D1-2, 40% mLCx, 50% pRCA, 99% dRCA, 99% RDPA --> consult CVTS; b.) 2v MIDCAB at [facility] (LIMA-LAD, SVG-PDA)   DDD (degenerative disc disease), lumbar    Diverticulosis    DOE (dyspnea on exertion)    Elevated PSA    Erectile dysfunction    a.) on PDE5i (sildenafil) PRN   GI bleed 09/29/2019   History of kidney stones    History of tobacco abuse    a.) quit 2015   Hyperlipidemia    Hypertension    Long term current use of anticoagulant    a.) apixaban   NSTEMI (non-ST elevated myocardial infarction) ([place]) 05/02/2012   a.) NSTEMI 05/02/2012 --> LHC 05/03/2012 at [facility] --> EF 56%, apical dyskinesis, 99% mLAD, 70% D1-1, 75% D1-2, 40% mLCx, 50% pRCA, 99% dRCA, 99% RDPA --> consult CVTS; b.) 2v MIDCAB at [facility] (LIMA-LAD, SVG-PDA)   Osteoarthritis    Peripheral edema    Pneumonia    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   a.) 2v MIDCAB --> LIMA-LAD, SVG-PDA   Squamous cell carcinoma of skin 11/21/2021   L dorsum hand - ED&C   Squamous cell carcinoma of skin 11/21/2021   L dorsum forearm - ED&C   Thoracic ascending aortic aneurysm ([place]) 06/16/2020   a.) CT chest 06/16/2020: measured 4.6 x 4.0 cm; b.) CT chest 04/26/2022: measured 4.1 cm; c.) CTA chest 09/08/2022: measured 4.4 cm   Past Surgical History:  Procedure Laterality Date   BYPASS AXILLA/BRACHIAL ARTERY     CHOLECYSTECTOMY     COLONOSCOPY WITH PROPOFOL N/A 10/02/2019    Procedure: COLONOSCOPY WITH PROPOFOL;  Surgeon: [place]edo, Benay Pike, MD;  Location: [facility] ENDOSCOPY;  Service: Gastroenterology;  Laterality: N/A;   MINIMALLY INVASIVE DIRECT CORONARY ARTERY BYPASS (MIDCAB) Left 05/13/2012   Procedure: MINIMALLY INVASIVE DIRECT CORONARY ARTERY BYPASS (2v MIDCAB); Location: [facility]   TOTAL HIP ARTHROPLASTY Left 2006   Family History  Problem Relation Age of Onset   Breast cancer Mother    Heart attack Father    Social History   Tobacco Use   Smoking status: Former    Types: Cigarettes    Quit date: 2015    Years since quitting: 8.8   Smokeless tobacco: Never  Vaping Use   Vaping Use: Never used  Substance Use Topics   Alcohol use: Yes    Alcohol/week: 5.0 standard drinks of alcohol    Types: 5 Shots of liquor per week   Drug use: No    Pertinent Clinical Results:  LABS: Labs reviewed: Acceptable for surgery.  Lab Results  Component Value Date   WBC 13.5 (H) 09/12/2022   HGB 14.7 09/12/2022   HCT 44.8 09/12/2022   MCV 90.7 09/12/2022  PLT 595 (H) 09/12/2022   Lab Results  Component Value Date   NA 141 09/12/2022   K 3.3 (L) 09/12/2022   CO2 32 09/12/2022   GLUCOSE 103 (H) 09/12/2022   BUN 24 (H) 09/12/2022   CREATININE 1.08 09/12/2022   CALCIUM 8.7 (L) 09/12/2022   GFRNONAA >60 09/12/2022    ECG: Date: 09/08/2022 Time ECG obtained: 2110 PM Rate: 83 bpm Rhythm: atrial fibrillation Axis (leads I and aVF): Normal Intervals: QRS 102 ms. QTc 459 6 ms. ST segment and T wave changes: No evidence of acute ST segment elevation or depression Comparison: Similar to previous tracing obtained on 04/24/2022   IMAGING / PROCEDURES: MR PROSTATE W WO CONTRAST performed on 10/03/2022 PI-RADS category 4 lesion of the left posteromedial peripheral zone and PI-RADS category 3 lesion of the right anterior transition zone. Targeting data sent to [place]. Prostatomegaly and benign prostatic hypertrophy. Extensive sigmoid colon diverticulosis. Trace  amount of free pelvic fluid.  TRANSTHORACIC ECHOCARDIOGRAM performed on 09/10/2022 Left ventricular ejection fraction, by estimation, is 60 to 65%. The left ventricle has normal function. The left ventricle demonstrates regional wall motion abnormalities. The left ventricular internal cavity size was mildly dilated. There is mild left ventricular hypertrophy. Left ventricular diastolic parameters are indeterminate.  Right ventricular systolic function is mildly reduced. The right ventricular size is mildly enlarged. Mildly increased right ventricular wall thickness.  Left atrial size was moderately dilated.  Right atrial size was moderately dilated.  The mitral valve is grossly normal. Mild mitral valve regurgitation.  The aortic valve is calcified. Aortic valve regurgitation is mild. Aortic valve sclerosis/calcification is present, without any evidence of aortic stenosis.   CT ANGIO CHEST PE W AND/OR WO CONTRAST performed on 09/08/2022 Negative for acute pulmonary embolus. Cardiomegaly with small bilateral pleural effusions. Multifocal bilateral areas of tree-in-bud density and heterogeneous ground-glass densities consistent with multifocal infection/pneumonia, to include atypical organisms. Mild mediastinal adenopathy is probably reactive Ascending aortic aneurysm up to 4.4 cm.   MYOCARDIAL PERFUSION IMAGING STUDY (LEXISCAN) performed on 05/09/2017 Normal left ventricular systolic function with a normal LVEF of 61% Normal myocardial thickening and wall motion Left ventricular cavity size normal SPECT images demonstrate homogenous tracer distribution throughout the myocardium No evidence of stress-induced myocardial ischemia or arrhythmia Normal low risk study  Impression and Plan:  David Hardin has been referred for pre-anesthesia review and clearance prior to him undergoing the planned anesthetic and procedural courses. Available labs, pertinent testing, and imaging results were  personally reviewed by me. This patient has been appropriately cleared by cardiology with an overall ACCEPTABLE risk of significant perioperative cardiovascular complications.  Based on clinical review performed today (10/17/22), barring any significant acute changes in the patient's overall condition, it is anticipated that he will be able to proceed with the planned surgical intervention. Any acute changes in clinical condition may necessitate his procedure being postponed and/or cancelled. Patient will meet with anesthesia team (MD and/or CRNA) on the day of his procedure for preoperative evaluation/assessment. Questions regarding anesthetic course will be fielded at that time.   Pre-surgical instructions were reviewed with the patient during his PAT appointment and questions were fielded by PAT clinical staff. Patient was advised that if any questions or concerns arise prior to his procedure then he should return a call to PAT and/or his surgeon's office to discuss.  Honor Loh, MSN, APRN, FNP-C, CEN [facility]  Peri-operative Services Nurse Practitioner Phone: [phone] Fax: [phone] 10/17/22 12:51 PM  NOTE:  This note has been prepared using [employer]. Despite my best ability to proofread, there is always the potential that unintentional transcriptional errors may still occur from this process.

## 2022-10-18 MED ORDER — ORAL CARE MOUTH RINSE: 15.0000 mL | Freq: Once | OROMUCOSAL | Status: AC

## 2022-10-18 MED ORDER — LACTATED RINGERS IV SOLN: INTRAVENOUS | Status: DC

## 2022-10-18 MED ORDER — FAMOTIDINE 20 MG PO TABS
20.0000 mg | ORAL_TABLET | Freq: Once | ORAL | Status: AC
  Administered 2022-10-19: 20 mg via ORAL

## 2022-10-18 MED ORDER — CHLORHEXIDINE GLUCONATE 0.12 % MT SOLN
15.0000 mL | Freq: Once | OROMUCOSAL | Status: AC
  Administered 2022-10-19: 15 mL via OROMUCOSAL

## 2022-10-18 MED ORDER — CEFAZOLIN SODIUM-DEXTROSE 1-4 GM/50ML-% IV SOLN
1.0000 g | Freq: Once | INTRAVENOUS | Status: AC
  Administered 2022-10-19: 1 g via INTRAVENOUS

## 2022-10-18 MED ORDER — GENTAMICIN IN SALINE 1.6-0.9 MG/ML-% IV SOLN
80.0000 mg | INTRAVENOUS | Status: AC
  Administered 2022-10-19: 80 mg via INTRAVENOUS
  Filled 2022-10-18: qty 50

## 2022-10-18 MED ORDER — GENTAMICIN SULFATE 40 MG/ML IJ SOLN
80.0000 mg | Freq: Once | INTRAVENOUS | Status: DC
  Filled 2022-10-18: qty 2

## 2022-10-19 ENCOUNTER — Encounter: Payer: Self-pay | Admitting: Urology

## 2022-10-19 ENCOUNTER — Other Ambulatory Visit: Payer: Self-pay

## 2022-10-19 ENCOUNTER — Ambulatory Visit
Admission: RE | Admit: 2022-10-19 | Discharge: 2022-10-19 | Disposition: A | Discharge status: Home / Self Care | Payer: Medicare HMO | Attending: Urology | Admitting: Urology

## 2022-10-19 ENCOUNTER — Encounter
Admission: RE | Disposition: A | Discharge status: Home / Self Care | Payer: Self-pay | Source: Home / Self Care | Attending: Urology

## 2022-10-19 ENCOUNTER — Ambulatory Visit: Payer: Medicare HMO | Admitting: Urgent Care

## 2022-10-19 ENCOUNTER — Ambulatory Visit: Payer: Medicare HMO | Admitting: Family

## 2022-10-19 DIAGNOSIS — R972 Elevated prostate specific antigen [PSA]: Secondary | ICD-10-CM | POA: Diagnosis present

## 2022-10-19 DIAGNOSIS — E785 Hyperlipidemia, unspecified: Secondary | ICD-10-CM | POA: Diagnosis not present

## 2022-10-19 DIAGNOSIS — Z87891 Personal history of nicotine dependence: Secondary | ICD-10-CM | POA: Insufficient documentation

## 2022-10-19 DIAGNOSIS — I5032 Chronic diastolic (congestive) heart failure: Secondary | ICD-10-CM | POA: Diagnosis not present

## 2022-10-19 DIAGNOSIS — Z951 Presence of aortocoronary bypass graft: Secondary | ICD-10-CM | POA: Diagnosis not present

## 2022-10-19 DIAGNOSIS — I2510 Atherosclerotic heart disease of native coronary artery without angina pectoris: Secondary | ICD-10-CM | POA: Diagnosis not present

## 2022-10-19 DIAGNOSIS — I110 Hypertensive heart disease with heart failure: Secondary | ICD-10-CM | POA: Diagnosis not present

## 2022-10-19 DIAGNOSIS — J449 Chronic obstructive pulmonary disease, unspecified: Secondary | ICD-10-CM | POA: Diagnosis not present

## 2022-10-19 DIAGNOSIS — I4891 Unspecified atrial fibrillation: Secondary | ICD-10-CM | POA: Diagnosis not present

## 2022-10-19 DIAGNOSIS — Z79899 Other long term (current) drug therapy: Secondary | ICD-10-CM | POA: Diagnosis not present

## 2022-10-19 DIAGNOSIS — I252 Old myocardial infarction: Secondary | ICD-10-CM | POA: Diagnosis not present

## 2022-10-19 DIAGNOSIS — R001 Bradycardia, unspecified: Secondary | ICD-10-CM | POA: Insufficient documentation

## 2022-10-19 HISTORY — DX: Benign prostatic hyperplasia without lower urinary tract symptoms: N40.0

## 2022-10-19 HISTORY — PX: PROSTATE BIOPSY: SHX241

## 2022-10-19 HISTORY — DX: Diverticulosis of intestine, part unspecified, without perforation or abscess without bleeding: K57.90

## 2022-10-19 HISTORY — DX: Edema, unspecified: R60.9

## 2022-10-19 HISTORY — DX: Cardiomegaly: I51.7

## 2022-10-19 HISTORY — DX: Other intervertebral disc degeneration, lumbar region without mention of lumbar back pain or lower extremity pain: M51.369

## 2022-10-19 HISTORY — DX: Male erectile dysfunction, unspecified: N52.9

## 2022-10-19 HISTORY — DX: Other forms of dyspnea: R06.09

## 2022-10-19 HISTORY — DX: Long term (current) use of anticoagulants: Z79.01

## 2022-10-19 HISTORY — DX: Localized edema: R60.0

## 2022-10-19 HISTORY — DX: Personal history of nicotine dependence: Z87.891

## 2022-10-19 HISTORY — DX: Elevated prostate specific antigen (PSA): R97.20

## 2022-10-19 HISTORY — DX: Atherosclerosis of aorta: I70.0

## 2022-10-19 HISTORY — DX: Bradycardia, unspecified: R00.1

## 2022-10-19 HISTORY — DX: Unspecified osteoarthritis, unspecified site: M19.90

## 2022-10-19 HISTORY — DX: Other intervertebral disc degeneration, lumbar region: M51.36

## 2022-10-19 SURGERY — BIOPSY, PROSTATE
Anesthesia: General

## 2022-10-19 MED ORDER — PROPOFOL 1000 MG/100ML IV EMUL
INTRAVENOUS | Status: AC
  Filled 2022-10-19: qty 100

## 2022-10-19 MED ORDER — EPHEDRINE 5 MG/ML INJ
INTRAVENOUS | Status: AC
  Filled 2022-10-19: qty 5

## 2022-10-19 MED ORDER — CEFAZOLIN SODIUM-DEXTROSE 1-4 GM/50ML-% IV SOLN
INTRAVENOUS | Status: AC
  Filled 2022-10-19: qty 50

## 2022-10-19 MED ORDER — EPHEDRINE SULFATE (PRESSORS) 50 MG/ML IJ SOLN
INTRAMUSCULAR | Status: DC | PRN
  Administered 2022-10-19: 10 mg via INTRAVENOUS

## 2022-10-19 MED ORDER — LIDOCAINE HCL (CARDIAC) PF 100 MG/5ML IV SOSY
PREFILLED_SYRINGE | INTRAVENOUS | Status: DC | PRN
  Administered 2022-10-19: 60 mg via INTRATRACHEAL

## 2022-10-19 MED ORDER — PROPOFOL 500 MG/50ML IV EMUL
INTRAVENOUS | Status: DC | PRN
  Administered 2022-10-19: 100 ug/kg/min via INTRAVENOUS

## 2022-10-19 MED ORDER — LEVOFLOXACIN 500 MG PO TABS: 500.0000 mg | ORAL_TABLET | Freq: Every day | ORAL | 0 refills | Status: DC

## 2022-10-19 MED ORDER — DOCUSATE SODIUM 100 MG PO CAPS: 200.0000 mg | ORAL_CAPSULE | Freq: Two times a day (BID) | ORAL | 3 refills | Status: AC

## 2022-10-19 MED ORDER — CHLORHEXIDINE GLUCONATE 0.12 % MT SOLN
OROMUCOSAL | Status: AC
  Filled 2022-10-19: qty 15

## 2022-10-19 MED ORDER — GLYCOPYRROLATE 0.2 MG/ML IJ SOLN
INTRAMUSCULAR | Status: DC | PRN
  Administered 2022-10-19: .2 mg via INTRAVENOUS

## 2022-10-19 MED ORDER — FAMOTIDINE 20 MG PO TABS
ORAL_TABLET | ORAL | Status: AC
  Filled 2022-10-19: qty 1

## 2022-10-19 MED ORDER — FENTANYL CITRATE (PF) 100 MCG/2ML IJ SOLN
INTRAMUSCULAR | Status: AC
  Filled 2022-10-19: qty 2

## 2022-10-19 SURGICAL SUPPLY — 9 items
COVER MAYO STAND REUSABLE (DRAPES) ×1 IMPLANT
CUP MEDICINE 2OZ PLAST GRAD ST (MISCELLANEOUS) ×1 IMPLANT
GLOVE BIOGEL M STRL SZ7.5 (GLOVE) ×1 IMPLANT
INST BIOPSY MAXCORE 18GX25 (NEEDLE) ×1 IMPLANT
STRAP SAFETY 5IN WIDE (MISCELLANEOUS) ×1 IMPLANT
SURGILUBE 2OZ TUBE FLIPTOP (MISCELLANEOUS) ×1 IMPLANT
TOWEL OR 17X26 4PK STRL BLUE (TOWEL DISPOSABLE) ×1 IMPLANT
TRAP FLUID SMOKE EVACUATOR (MISCELLANEOUS) ×1 IMPLANT
WATER STERILE IRR 500ML POUR (IV SOLUTION) ×1 IMPLANT

## 2022-10-19 NOTE — Transfer of Care (Signed)
Immediate Anesthesia Transfer of Care Note  Patient: David Hardin  Procedure(s) Performed: PROSTATE BIOPSY URONAV  Patient Location: PACU  Anesthesia Type:General  Level of Consciousness: drowsy and patient cooperative  Airway & Oxygen Therapy: Patient Spontanous Breathing  Post-op Assessment: Report given to RN and Post -op Vital signs reviewed and stable  Post vital signs: Reviewed and stable  Last Vitals:  Vitals Value Taken Time  BP 132/75 10/19/22 0934  Temp 37 C 10/19/22 0934  Pulse 30 10/19/22 0938  Resp 13 10/19/22 0938  SpO2 98 % 10/19/22 0938  Vitals shown include unvalidated device data.  Last Pain:  Vitals:   10/19/22 0934  TempSrc:   PainSc: 0-No pain         Complications: No notable events documented.

## 2022-10-19 NOTE — Anesthesia Procedure Notes (Signed)
Date/Time: 10/19/2022 9:11 AM  Performed by: Jonna Clark, CRNAPre-anesthesia Checklist: Patient identified, Emergency Drugs available, Suction available and Patient being monitored Patient Re-evaluated:Patient Re-evaluated prior to induction Oxygen Delivery Method: Nasal cannula Preoxygenation: Pre-oxygenation with 100% oxygen Induction Type: IV induction Airway Equipment and Method: Bite block Placement Confirmation: positive ETCO2 Dental Injury: Teeth and Oropharynx as per pre-operative assessment

## 2022-10-19 NOTE — Anesthesia Postprocedure Evaluation (Signed)
Anesthesia Post Note  Patient: David Hardin  Procedure(s) Performed: PROSTATE BIOPSY URONAV  Patient location during evaluation: PACU Anesthesia Type: General Level of consciousness: awake and alert Pain management: pain level controlled Vital Signs Assessment: post-procedure vital signs reviewed and stable Respiratory status: spontaneous breathing, nonlabored ventilation, respiratory function stable and patient connected to nasal cannula oxygen Cardiovascular status: blood pressure returned to baseline and stable Postop Assessment: no apparent nausea or vomiting Anesthetic complications: no   No notable events documented.   Last Vitals:  Vitals:   10/19/22 0945 10/19/22 0959  BP: (!) 142/69 (!) 140/54  Pulse: 61 61  Resp: 14 20  Temp:  (!) 36.4 C  SpO2: 99% 100%    Last Pain:  Vitals:   10/19/22 0959  TempSrc:   PainSc: 0-No pain                 Ilene Qua

## 2022-10-19 NOTE — Anesthesia Preprocedure Evaluation (Signed)
Anesthesia Evaluation  Patient identified by MRN, date of birth, ID band Patient awake    Reviewed: Allergy & Precautions, NPO status , Patient's Chart, lab work & pertinent test results  History of Anesthesia Complications Negative for: history of anesthetic complications  Airway Mallampati: II  TM Distance: >3 FB Neck ROM: full    Dental  (+) Chipped   Pulmonary COPD,  COPD inhaler, former smoker   Pulmonary exam normal        Cardiovascular Exercise Tolerance: Good hypertension, On Medications + CAD, + Past MI, + CABG, +CHF and + DOE  + dysrhythmias (bradycardia) Atrial Fibrillation   Most recent TTE was performed on 09/10/2022 revealing a normal left ventricular systolic function with an EF of 60-65%.  There were no regional wall motion abnormalities noted.  Left ventricular internal cavity size was mildly dilated.  There was mild LVH.  Left ventricular diastolic parameters indeterminant.  Right ventricular systolic function mildly reduced and size mildly enlarged.  There was moderate biatrial enlargement.  Mild mitral valve and aortic valve regurgitation noted.  Aortic valve also noted to be sclerotic with calcifications present.  There was no evidence of significant transvalvular gradient to suggest stenosis.   Neuro/Psych negative neurological ROS  negative psych ROS   GI/Hepatic negative GI ROS, Neg liver ROS,,,  Endo/Other  negative endocrine ROS    Renal/GU      Musculoskeletal  (+) Arthritis , Osteoarthritis,  Lumbar DDD   Abdominal   Peds  Hematology negative hematology ROS (+)   Anesthesia Other Findings Patient is followed by cardiology Saralyn Pilar, MD). He was last seen in the cardiology clinic on 10/12/2022; notes reviewed.  At the time of his clinic visit, patient reported to be doing well overall from a cardiovascular perspective.  He was recently admitted here at [facility] on 09/08/2022 with acute on chronic diastolic heart failure, multifocal pneumonia, and atrial fibrillation.  Since discharge, patient denies any episodes of chest pain, shortness breath, PND, orthopnea, palpitations, significant peripheral edema, vertiginous symptoms, or presyncope/syncope.  Past Medical History: 05/03/2012: (HFpEF) heart failure with preserved ejection fraction  ([place])     Comment:  a.) TTE 05/03/2012: EF 50-55%, apical AK, mild AR; b.)               TTE 05/11/2022: EF >55%, MAC, mod BAE, mod RVE, triv               AR/PR, mild MR/TR; c.) TTE 09/10/2022: EF 60-65%, mild               LVH, mod BAE, mild MR, AoV sclerosis without stenosis No date: Aortic atherosclerosis (HCC) No date: Atrial fibrillation (HCC)     Comment:  a.) CHA2DS2VASc = 5 (age x2, HFpEF, HTN, NSTEMI);  b.)               rate/rhythm maintained without pharmacological               intervention; chronically anticoagulated with apixaban No date: BPH (benign prostatic hyperplasia) No date: Bradycardia No date: Cardiomegaly No date: COPD (chronic obstructive pulmonary disease) ([place]) 05/03/2012: Coronary artery disease     Comment:  a.) LHC 05/03/2012:  EF 56%, apical dyskinesis, 99%               mLAD, 70% D1-1, 75% D1-2, 40% mLCx, 50% pRCA, 99% dRCA,               99% RDPA --> consult  CVTS; b.) 2v MIDCAB at [facility]               (LIMA-LAD, SVG-PDA) No date: DDD (degenerative disc disease), lumbar No date: Diverticulosis No date: DOE (dyspnea on exertion) No date: Elevated PSA No date: Erectile dysfunction     Comment:  a.) on PDE5i (sildenafil) PRN 09/29/2019: GI bleed No date: History of kidney stones No date: History of tobacco abuse     Comment:  a.) quit 2015 No date: Hyperlipidemia No date: Hypertension No date: Long term current use of anticoagulant     Comment:  a.) apixaban 05/02/2012: NSTEMI (non-ST elevated myocardial infarction) ([place])     Comment:  a.) NSTEMI 05/02/2012 --> LHC  05/03/2012 at [facility] --> EF               56%, apical dyskinesis, 99% mLAD, 70% D1-1, 75% D1-2, 40%              mLCx, 50% pRCA, 99% dRCA, 99% RDPA --> consult CVTS; b.)               2v MIDCAB at [facility] (LIMA-LAD, SVG-PDA) No date: Osteoarthritis No date: Peripheral edema No date: Pneumonia 05/13/2012: S/P CABG x 2     Comment:  a.) 2v MIDCAB --> LIMA-LAD, SVG-PDA 11/21/2021: Squamous cell carcinoma of skin     Comment:  L dorsum hand - ED&C 11/21/2021: Squamous cell carcinoma of skin     Comment:  L dorsum forearm - ED&C 06/16/2020: Thoracic ascending aortic aneurysm ([place])     Comment:  a.) CT chest 06/16/2020: measured 4.6 x 4.0 cm; b.) CT               chest 04/26/2022: measured 4.1 cm; c.) CTA chest               09/08/2022: measured 4.4 cm  Past Surgical History: No date: BYPASS AXILLA/BRACHIAL ARTERY No date: CHOLECYSTECTOMY 10/02/2019: COLONOSCOPY WITH PROPOFOL; N/A     Comment:  Procedure: COLONOSCOPY WITH PROPOFOL;  Surgeon: [place],               Benay Pike, MD;  Location: [facility] ENDOSCOPY;  Service:               Gastroenterology;  Laterality: N/A; 05/13/2012: MINIMALLY INVASIVE DIRECT CORONARY ARTERY BYPASS  (MIDCAB); Left     Comment:  Procedure: MINIMALLY INVASIVE DIRECT CORONARY ARTERY               BYPASS (2v MIDCAB); Location: [facility] 2006: TOTAL HIP ARTHROPLASTY; Left  BMI    Body Mass Index: 31.32 kg/m      Reproductive/Obstetrics negative OB ROS                              Anesthesia Physical Anesthesia Plan  ASA: 3  Anesthesia Plan: General LMA   Post-op Pain Management: Toradol IV (intra-op)* and Ofirmev IV (intra-op)*   Induction: Intravenous  PONV Risk Score and Plan: 2 and Dexamethasone, Ondansetron and Treatment may vary due to age or medical condition  Airway Management Planned: LMA  Additional Equipment:   Intra-op Plan:   Post-operative Plan: Extubation in OR  Informed Consent: I have reviewed the patients  History and Physical, chart, labs and discussed the procedure including the risks, benefits and alternatives for the proposed anesthesia with the patient or authorized representative who has indicated his/her understanding and acceptance.     Dental Advisory Given  Plan Discussed with: Anesthesiologist, CRNA and Surgeon  Anesthesia Plan Comments: (Patient consented for risks of anesthesia including but not limited to:  - adverse reactions to medications - damage to eyes, teeth, lips or other oral mucosa - nerve damage due to positioning  - sore throat or hoarseness - Damage to heart, brain, nerves, lungs, other parts of body or loss of life  Patient voiced understanding.)         Anesthesia Quick Evaluation

## 2022-10-19 NOTE — H&P (Signed)
Date of Initial H&P: 10/17/22  History reviewed, patient examined, no change in status, stable for surgery.

## 2022-10-19 NOTE — Op Note (Signed)
Preoperative diagnosis: 1.  Elevated PSA (R97.2)                                           2.  Abnormal prostate gland MRI scan (D40.0)  Postoperative diagnosis: Same  Procedure: UroNav fusion biopsy of the prostate (CPT [phone], [id number])  Surgeon: Otelia Limes. Yves Dill MD  Anesthesia: General  Indications:See the history and physical also. 88 year old ( [place]: [birth year]) white male who initially presented with urinary retention, but has been able to void successfully since he has been on tamsulosin and dutasteride.  He was found to have a PSA level of 16.1 ng/mL. This was evaluated with MRI scan 10/24 indicating prostate volume of 123 mL with a PIRADS category 4 lesion on the left side and measuring 1.3 x 0.6 x 0.7 cm as well as a PIRADS category 3 lesion on the right side measuring 1.3 x 1.1 x 0.6 cm.  He comes in now for UroNav fusion biopsy of the prostate.  He may have had a prostate biopsy many years ago in [place], but those records are not available to me.  He does recall that the biopsy was done in office setting without UroNav guidance and without prior MRI scan and was benign. After informed consent the above procedure(s) were requested     Technique and findings: After adequate sedation had been obtained the patient was placed into left lateral decubitus position and DRE was performed.  The rectal vault was noted to be clean.  The ultrasound probe was placed and images acquired.  The ultrasound images were then fused with the MRI images.  Region of interest #1 was identified and 4 core biopsies obtained here.  Region of interest #2 was identified and 4 core biopsies obtained here.  At this point the ultrasound probe was removed and procedure terminated.  Blood loss was minimal.  The patient tolerated the procedure well and was transferred to the recovery room in stable condition.

## 2022-10-19 NOTE — Progress Notes (Signed)
Patient ID: David Hardin, male    DOB: 1927-05-19, 88 y.o.   MRN: [id number]  HPI  Mr Dimartino is a 88 y/o male with a history of CAD, hyperlipidemia, HTN, atrial fibrillation, COPD, previous tobacco use and chronic heart failure.   Echo report from 09/10/22 reviewed and showed an EF of 60-65% along with mild LVH, moderate LAE, moderate RAE and mild MR/AR.   Was in the ED 09/13/22 due to urinary retention after foley removed yesterday. Foley catheter replaced and symptoms improved and he was released. Admitted 09/08/22 due to weight gain and lower extremity edema that didn't improve after lasix increased. Chest CTA negative for PE but positive for HF and pneumonia. Cardiology consult obtained. Initially given IV lasix/ antibiotics with transition to oral medications. Foley placed for urinary retention with subsequent removal and successful voiding. Discharged after 4 days.   He presents today with a chief complaint of a follow-up visit. He currently voices no concerns and specifically denies any difficulty sleeping, dizziness, abdominal distention, palpitations, pedal edema, chest pain, wheezing, shortness of breath, cough or fatigue.   Has had some weight gain but had a prostate biopsy done yesterday and was given IV fluids. Will be going on a cruise later this year.   Past Medical History:  Diagnosis Date   (HFpEF) heart failure with preserved ejection fraction ([place]) 05/03/2012   a.) TTE 05/03/2012: EF 50-55%, apical AK, mild AR; b.) TTE 05/11/2022: EF >55%, MAC, mod BAE, mod RVE, triv AR/PR, mild MR/TR; c.) TTE 09/10/2022: EF 60-65%, mild LVH, mod BAE, mild MR, AoV sclerosis without stenosis   Aortic atherosclerosis (HCC)    Atrial fibrillation (HCC)    a.) CHA2DS2VASc = 5 (age x2, HFpEF, HTN, NSTEMI);  b.) rate/rhythm maintained without pharmacological intervention; chronically anticoagulated with apixaban   BPH (benign prostatic hyperplasia)    Bradycardia    Cardiomegaly    COPD (chronic  obstructive pulmonary disease) ([place])    Coronary artery disease 05/03/2012   a.) LHC 05/03/2012:  EF 56%, apical dyskinesis, 99% mLAD, 70% D1-1, 75% D1-2, 40% mLCx, 50% pRCA, 99% dRCA, 99% RDPA --> consult CVTS; b.) 2v MIDCAB at [facility] (LIMA-LAD, SVG-PDA)   DDD (degenerative disc disease), lumbar    Diverticulosis    DOE (dyspnea on exertion)    Elevated PSA    Erectile dysfunction    a.) on PDE5i (sildenafil) PRN   GI bleed 09/29/2019   History of kidney stones    History of tobacco abuse    a.) quit 2015   Hyperlipidemia    Hypertension    Long term current use of anticoagulant    a.) apixaban   NSTEMI (non-ST elevated myocardial infarction) ([place]) 05/02/2012   a.) NSTEMI 05/02/2012 --> LHC 05/03/2012 at [facility] --> EF 56%, apical dyskinesis, 99% mLAD, 70% D1-1, 75% D1-2, 40% mLCx, 50% pRCA, 99% dRCA, 99% RDPA --> consult CVTS; b.) 2v MIDCAB at [facility] (LIMA-LAD, SVG-PDA)   Osteoarthritis    Peripheral edema    Pneumonia    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   a.) 2v MIDCAB --> LIMA-LAD, SVG-PDA   Squamous cell carcinoma of skin 11/21/2021   L dorsum hand - ED&C   Squamous cell carcinoma of skin 11/21/2021   L dorsum forearm - ED&C   Thoracic ascending aortic aneurysm ([place]) 06/16/2020   a.) CT chest 06/16/2020: measured 4.6 x 4.0 cm; b.) CT chest 04/26/2022: measured 4.1 cm; c.) CTA chest 09/08/2022: measured 4.4 cm   Past Surgical History:  Procedure Laterality Date   BYPASS AXILLA/BRACHIAL ARTERY     CHOLECYSTECTOMY     COLONOSCOPY WITH PROPOFOL N/A 10/02/2019   Procedure: COLONOSCOPY WITH PROPOFOL;  Surgeon: Toledo, Benay Pike, MD;  Location: [facility] END[facility];  Service: Gastroenterology;  Laterality: N/A;   MINIMALLY INVASIVE DIRECT CORONARY ARTERY BYPASS (MIDCAB) Left 05/13/2012   Procedure: MINIMALLY INVASIVE DIRECT CORONARY ARTERY BYPASS (2v MIDCAB); Location: [facility]   PROSTATE BIOPSY N/A 10/19/2022   Procedure: PROSTATE BIOPSY Vernelle Emerald;  Surgeon: Royston Cowper, MD;  Location: [facility] ORS;   Service: Urology;  Laterality: N/A;   TOTAL HIP ARTHROPLASTY Left 2006   Family History  Problem Relation Age of Onset   Breast cancer Mother    Heart attack Father    Social History   Tobacco Use   Smoking status: Former    Types: Cigarettes    Quit date: 2015    Years since quitting: 8.8   Smokeless tobacco: Never  Substance Use Topics   Alcohol use: Yes    Alcohol/week: 5.0 standard drinks of alcohol    Types: 5 Shots of liquor per week   No Known Allergies  Prior to Admission medications   Medication Sig Start Date End Date Taking? Authorizing Provider  albuterol (VENTOLIN HFA) 108 (90 Base) MCG/ACT inhaler Inhale 2 puffs into the lungs every 6 (six) hours as needed. 05/29/22  Yes [provider]  amLODipine (NORVASC) 5 MG tablet Take 5 mg by mouth daily. 10/21/18  Yes [provider]  apixaban (ELIQUIS) 5 MG TABS tablet Take 5 mg by mouth every 12 (twelve) hours. 11/25/18  Yes [provider]  aspirin 81 MG chewable tablet Chew 81 mg by mouth daily.   Yes [provider]  atorvastatin (LIPITOR) 10 MG tablet Take 10 mg by mouth daily. 08/22/22  Yes [provider]  dapagliflozin propanediol (FARXIGA) 10 MG TABS tablet Take 1 tablet (10 mg total) by mouth daily. 09/13/22  Yes Shawna Clamp, MD  docusate sodium (COLACE) 100 MG capsule Take 2 capsules (200 mg total) by mouth 2 (two) times daily. 10/19/22  Yes Royston Cowper, MD  fluticasone [facility]) 50 MCG/ACT nasal spray Place 2 sprays into the nose daily as needed. 04/29/18  Yes [provider]  furosemide (LASIX) 40 MG tablet Take 40 mg by mouth daily. 08/22/22  Yes [provider]  levofloxacin (LEVAQUIN) 500 MG tablet Take 1 tablet (500 mg total) by mouth daily. 10/19/22  Yes Royston Cowper, MD  losartan (COZAAR) 50 MG tablet Take 1 tablet (50 mg total) by mouth daily. 09/13/22  Yes Shawna Clamp, MD  montelukast (SINGULAIR) 10 MG tablet Take 10 mg by mouth at  bedtime. 12/06/18  Yes [provider]  Multiple Vitamins-Minerals (PRESERVISION AREDS) CAPS Take 1 capsule by mouth 2 (two) times daily.   Yes [provider]  potassium chloride SA (KLOR-CON M) 20 MEQ tablet Take 1 tablet (20 mEq total) by mouth as needed. Take this if you take metolazone 10/20/22  Yes Darylene Price A, FNP  sildenafil (REVATIO) 20 MG tablet  05/23/18  Yes [provider]  tamsulosin (FLOMAX) 0.4 MG CAPS capsule Take 0.8 mg by mouth daily. Take 1 capsule (0.4 mg total) by mouth once daily Take 30 minutes after same meal each day. 05/25/22  Yes [provider]  TRELEGY ELLIPTA 100-62.5-25 MCG/ACT AEPB Inhale 1 puff into the lungs daily. 08/23/22  Yes [provider]  ipratropium-albuterol (DUONEB) 0.5-2.5 (3) MG/3ML SOLN Take 3 mLs by nebulization  every 6 (six) hours as needed (shortness of breath, wheezing.). Patient not taking: Reported on 10/20/2022 12/13/18   Henreitta Leber, MD    Review of Systems  Constitutional:  Negative for appetite change and fatigue.  HENT:  Negative for congestion, postnasal drip and sore throat.   Eyes: Negative.   Respiratory:  Negative for cough, chest tightness, shortness of breath and wheezing.   Cardiovascular:  Negative for chest pain, palpitations and leg swelling.  Gastrointestinal:  Negative for abdominal distention and abdominal pain.  Endocrine: Negative.   Genitourinary: Negative.   Musculoskeletal:  Negative for back pain and neck pain.  Skin: Negative.   Allergic/Immunologic: Negative.   Neurological:  Negative for dizziness and headaches.  Hematological:  Negative for adenopathy. Does not bruise/bleed easily.  Psychiatric/Behavioral:  Negative for dysphoric mood and sleep disturbance. The patient is not nervous/anxious.    Vitals:   10/20/22 0923  BP: (!) 145/52  Pulse: (!) 48  Resp: 18  SpO2: 98%  Weight: 205 lb 4 oz (93.1 kg)  Height: _0  (1.575 m)   Wt Readings from Last 3  Encounters:  10/20/22 205 lb 4 oz (93.1 kg)  10/19/22 200 lb (90.7 kg)  09/18/22 199 lb 8 oz (90.5 kg)   Lab Results  Component Value Date   CREATININE 1.08 09/12/2022   CREATININE 1.06 09/11/2022   CREATININE 1.07 09/10/2022   Physical Exam Vitals and nursing note reviewed. Exam conducted with a chaperone present (daughter).  Constitutional:      Appearance: Normal appearance.  HENT:     Head: Normocephalic and atraumatic.  Cardiovascular:     Rate and Rhythm: Bradycardia present. Rhythm irregular.  Pulmonary:     Effort: Pulmonary effort is normal. No respiratory distress.     Breath sounds: No wheezing.  Abdominal:     General: There is no distension.     Palpations: Abdomen is soft.     Tenderness: There is no abdominal tenderness.  Musculoskeletal:        General: No tenderness.     Cervical back: Normal range of motion and neck supple.     Right lower leg: No edema.     Left lower leg: No edema.  Skin:    General: Skin is warm and dry.  Neurological:     General: No focal deficit present.     Mental Status: He is alert and oriented to person, place, and time.  Psychiatric:        Mood and Affect: Mood normal.        Behavior: Behavior normal.        Thought Content: Thought content normal.    Assessment & Plan:  1: Chronic heart failure with preserved ejection fraction with structural change (LVH)- - NYHA I - euvolemic today - weighing daily; reminded to call for an overnight weight gain of > 2 pounds or a weekly weight gain of > 5 pounds - weight up 6 pounds from last visit here 1 month ago - not adding salt and his daughters are cooking meals for him; daughter has ordered low sodium meals for his cruise trip but he knows he will still be getting more sodium than normal - drinking 32 ounces of tea, 12 ounces soda along with some coffee/ juice and he feels like he is staying <64 ounces/ day - on GDMT of farxiga and losartan - has metolazone that he can take  PRN so RX for potassium sent in so that if he  takes the metolazone, he will take a potassium tablet - likes to be active and encouraged him to be active but if he starts to feel SOB/ fatigued to stop and rest for a few minutes - BNP 09/08/22 was 544.8 - has gotten his flu vaccine for the season  2: HTN- - BP mildly elevated (145/52) - saw PCP Baldemar Lenis) 08/28/22 - BMP 09/13/22 reviewed and showed sodium 142, potassium 3.8, creatinine 1.16 and GFR 59  3: Atrial fibrillation- - saw cardiology (Paras[place]) 10/12/22 - HR (I) but he says that he doesn't notice palpitations - currently on apixaban and aspirion  4: COPD- - saw pulmonology Raul Del) 07/28/22 - using trelegy, nebulizer and sildenafil  5: Urinary retention- - prostate biopsy done 10/19/22   Medication list reviewed.   Due to HF stability, will not make a return appointment at this time. Advised patient and his daughter to follow closely with cardiology and PCP but that they could call back at anytime for questions or to make another appointment and they were comfortable with this plan.

## 2022-10-19 NOTE — Discharge Instructions (Addendum)
Transrectal Ultrasound-Guided Prostate Biopsy, Care After What can I expect after the procedure? After the procedure, it is common to have: Pain and discomfort near your butt (rectum), especially while sitting. Pink-colored pee (urine). This is due to small amounts of blood in your pee. A burning feeling while peeing. Blood in your poop (stool). Bleeding from your butt. Blood in your semen. Follow these instructions at home: Medicines Take over-the-counter and prescription medicines only as told by your doctor. If you were given a sedative during your procedure, do not drive or use machines until your doctor says that it is safe. A sedative is a medicine that helps you relax. If you were prescribed an antibiotic medicine, take it as told by your doctor. Do not stop taking it even if you start to feel better. Activity  Return to your normal activities when your doctor says that it is safe. Ask your doctor when it is okay for you to have sex. You may have to avoid lifting. Ask your doctor how much you can safely lift. General instructions  Drink enough water to keep your pee pale yellow. Watch your pee, poop, and semen for new bleeding or bleeding that gets worse. Keep all follow-up visits. Contact a doctor if: You have any of these: Blood clots in your pee or poop. Blood in your pee more than 2 weeks after the procedure. Blood in your semen more than 2 months after the procedure. New or worse bleeding in your pee, poop, or semen. Very bad belly pain. Your pee smells bad or unusual. You have trouble peeing. Your lower belly feels firm. You have problems getting an erection. You feel like you may vomit (are nauseous), or you vomit. Get help right away if: You have a fever or chills. You have bright red pee. You have very bad pain that does not get better with medicine. You cannot pee. Summary After this procedure, it is common to have pain and discomfort near your butt,  especially while sitting. You may have blood in your pee and poop. It is common to have blood in your semen. Get help right away if you have a fever or chills. This information is not intended to replace advice given to you by your health care provider. Make sure you discuss any questions you have with your health care provider. Document Revised: 05/23/2021 Document Reviewed: 05/23/2021 Elsevier Patient Education  [place]   The drugs that you were given will stay in your system until tomorrow so for the next 24 hours you should not:  Drive an automobile Make any legal decisions Drink any alcoholic beverage   You may resume regular meals tomorrow.  Today it is better to start with liquids and gradually work up to solid foods.  You may eat anything you prefer, but it is better to start with liquids, then soup and crackers, and gradually work up to solid foods.   Please notify your doctor immediately if you have any unusual bleeding, trouble breathing, redness and pain at the surgery site, drainage, fever, or pain not relieved by medication.    Additional Instructions: MAy resume all blood thinners in the am   Please contact your physician with any problems or Same Day Surgery at [phone], Monday through Friday 6 am to 4 pm, or [place] at [facility] number at [phone].

## 2022-10-20 ENCOUNTER — Ambulatory Visit: Payer: Medicare HMO | Attending: Family | Admitting: Family

## 2022-10-20 ENCOUNTER — Encounter: Payer: Self-pay | Admitting: Family

## 2022-10-20 VITALS — BP 145/52 | HR 48 | Resp 18 | Ht 62.0 in | Wt 205.2 lb

## 2022-10-20 DIAGNOSIS — I110 Hypertensive heart disease with heart failure: Secondary | ICD-10-CM | POA: Insufficient documentation

## 2022-10-20 DIAGNOSIS — I480 Paroxysmal atrial fibrillation: Secondary | ICD-10-CM | POA: Diagnosis not present

## 2022-10-20 DIAGNOSIS — Z87891 Personal history of nicotine dependence: Secondary | ICD-10-CM | POA: Insufficient documentation

## 2022-10-20 DIAGNOSIS — I2510 Atherosclerotic heart disease of native coronary artery without angina pectoris: Secondary | ICD-10-CM | POA: Diagnosis not present

## 2022-10-20 DIAGNOSIS — I4891 Unspecified atrial fibrillation: Secondary | ICD-10-CM | POA: Diagnosis not present

## 2022-10-20 DIAGNOSIS — Z7984 Long term (current) use of oral hypoglycemic drugs: Secondary | ICD-10-CM | POA: Diagnosis not present

## 2022-10-20 DIAGNOSIS — E785 Hyperlipidemia, unspecified: Secondary | ICD-10-CM | POA: Diagnosis not present

## 2022-10-20 DIAGNOSIS — J449 Chronic obstructive pulmonary disease, unspecified: Secondary | ICD-10-CM | POA: Insufficient documentation

## 2022-10-20 DIAGNOSIS — Z79899 Other long term (current) drug therapy: Secondary | ICD-10-CM | POA: Insufficient documentation

## 2022-10-20 DIAGNOSIS — R338 Other retention of urine: Secondary | ICD-10-CM | POA: Insufficient documentation

## 2022-10-20 DIAGNOSIS — I10 Essential (primary) hypertension: Secondary | ICD-10-CM | POA: Diagnosis not present

## 2022-10-20 DIAGNOSIS — R339 Retention of urine, unspecified: Secondary | ICD-10-CM

## 2022-10-20 DIAGNOSIS — I5032 Chronic diastolic (congestive) heart failure: Secondary | ICD-10-CM | POA: Insufficient documentation

## 2022-10-20 MED ORDER — POTASSIUM CHLORIDE CRYS ER 20 MEQ PO TBCR: 20.0000 meq | EXTENDED_RELEASE_TABLET | ORAL | 3 refills | Status: DC | PRN

## 2022-10-20 NOTE — Patient Instructions (Addendum)
Continue weighing daily and call for an overnight weight gain of 3 pounds or more or a weekly weight gain of more than 5 pounds. ? ? ?If you have voicemail, please make sure your mailbox is cleaned out so that we may leave a message and please make sure to listen to any voicemails.  ? ? ?Call us in the future if you need us for anything ?

## 2022-10-23 LAB — SURGICAL PATHOLOGY

## 2023-01-31 ENCOUNTER — Ambulatory Visit (INDEPENDENT_AMBULATORY_CARE_PROVIDER_SITE_OTHER): Payer: Medicare HMO | Admitting: Dermatology

## 2023-01-31 VITALS — BP 158/75 | HR 53

## 2023-01-31 DIAGNOSIS — D1801 Hemangioma of skin and subcutaneous tissue: Secondary | ICD-10-CM

## 2023-01-31 DIAGNOSIS — L578 Other skin changes due to chronic exposure to nonionizing radiation: Secondary | ICD-10-CM

## 2023-01-31 DIAGNOSIS — L814 Other melanin hyperpigmentation: Secondary | ICD-10-CM | POA: Diagnosis not present

## 2023-01-31 DIAGNOSIS — Z1283 Encounter for screening for malignant neoplasm of skin: Secondary | ICD-10-CM

## 2023-01-31 DIAGNOSIS — D692 Other nonthrombocytopenic purpura: Secondary | ICD-10-CM

## 2023-01-31 DIAGNOSIS — Z85828 Personal history of other malignant neoplasm of skin: Secondary | ICD-10-CM

## 2023-01-31 DIAGNOSIS — L570 Actinic keratosis: Secondary | ICD-10-CM

## 2023-01-31 DIAGNOSIS — D229 Melanocytic nevi, unspecified: Secondary | ICD-10-CM

## 2023-01-31 DIAGNOSIS — L821 Other seborrheic keratosis: Secondary | ICD-10-CM

## 2023-01-31 DIAGNOSIS — Z8589 Personal history of malignant neoplasm of other organs and systems: Secondary | ICD-10-CM

## 2023-01-31 NOTE — Progress Notes (Signed)
Follow-Up Visit   Subjective  David Hardin is a 89 y.o. male who presents for the following: Actinic Keratosis (Patient here today to recheck for precancerous skin lesions. He defers TBSE at this time.). The patient has spots, moles and lesions to be evaluated, some may be new or changing.  The following portions of the chart were reviewed this encounter and updated as appropriate:   Tobacco  Allergies  Meds  Problems  Med Hx  Surg Hx  Fam Hx     Review of Systems:  No other skin or systemic complaints except as noted in HPI or Assessment and Plan.  Objective  Well appearing patient in no apparent distress; mood and affect are within normal limits.  A full examination was performed including scalp, head, eyes, ears, nose, lips, neck, chest, axillae, abdomen, back, buttocks, bilateral upper extremities, bilateral lower extremities, hands, feet, fingers, toes, fingernails, and toenails. All findings within normal limits unless otherwise noted below.  Face x 8 (8) Erythematous thin papules/macules with gritty scale.    Assessment & Plan  AK (actinic keratosis) (8) Face x 8  Destruction of lesion - Face x 8 Complexity: simple   Destruction method: cryotherapy   Informed consent: discussed and consent obtained   Timeout:  patient name, date of birth, surgical site, and procedure verified Lesion destroyed using liquid nitrogen: Yes   Region frozen until ice ball extended beyond lesion: Yes   Outcome: patient tolerated procedure well with no complications   Post-procedure details: wound care instructions given    Lentigines - Scattered tan macules - Due to sun exposure - Benign-appearing, observe - Recommend daily broad spectrum sunscreen SPF 30+ to sun-exposed areas, reapply every 2 hours as needed. - Call for any changes  Seborrheic Keratoses - Stuck-on, waxy, tan-brown papules and/or plaques  - Benign-appearing - Discussed benign etiology and prognosis. -  Observe - Call for any changes  Melanocytic Nevi - Tan-brown and/or pink-flesh-colored symmetric macules and papules - Benign appearing on exam today - Observation - Call clinic for new or changing moles - Recommend daily use of broad spectrum spf 30+ sunscreen to sun-exposed areas.   Hemangiomas - Red papules - Discussed benign nature - Observe - Call for any changes  Actinic Damage - Chronic condition, secondary to cumulative UV/sun exposure - diffuse scaly erythematous macules with underlying dyspigmentation - Recommend daily broad spectrum sunscreen SPF 30+ to sun-exposed areas, reapply every 2 hours as needed.  - Staying in the shade or wearing long sleeves, sun glasses (UVA+UVB protection) and wide brim hats (4-inch brim around the entire circumference of the hat) are also recommended for sun protection.  - Call for new or changing lesions.  History of Squamous Cell Carcinoma of the Skin - No evidence of recurrence today - No lymphadenopathy - Recommend regular full body skin exams - Recommend daily broad spectrum sunscreen SPF 30+ to sun-exposed areas, reapply every 2 hours as needed.  - Call if any new or changing lesions are noted between office visits  Purpura - Chronic; persistent and recurrent.  Treatable, but not curable. - Violaceous macules and patches - Benign - Related to trauma, age, sun damage and/or use of blood thinners, chronic use of topical and/or oral steroids - Observe - Can use OTC arnica containing moisturizer such as Dermend Bruise Formula if desired - Call for worsening or other concerns  Return in about 6 months (around 08/01/2023) for AK follow up/sun exposed areas.  Luther Redo, CMA, am  acting as scribe for Sarina Ser, MD . Documentation: I have reviewed the above documentation for accuracy and completeness, and I agree with the above.  Sarina Ser, MD

## 2023-01-31 NOTE — Patient Instructions (Signed)
Due to recent changes in healthcare laws, you may see results of your pathology and/or laboratory studies on MyChart before the doctors have had a chance to review them. We understand that in some cases there may be results that are confusing or concerning to you. Please understand that not all results are received at the same time and often the doctors may need to interpret multiple results in order to provide you with the best plan of care or course of treatment. Therefore, we ask that you please give us 2 business days to thoroughly review all your results before contacting the office for clarification. Should we see a critical lab result, you will be contacted sooner.   If You Need Anything After Your Visit  If you have any questions or concerns for your doctor, please call our main line at [phone] and press option 4 to reach your doctor's medical assistant. If no one answers, please leave a voicemail as directed and we will return your call as soon as possible. Messages left after 4 pm will be answered the following business day.   You may also send us a message via MyChart. We typically respond to MyChart messages within 1-2 business days.  For prescription refills, please ask your pharmacy to contact our office. Our fax number is [phone].  If you have an urgent issue when the clinic is closed that cannot wait until the next business day, you can page your doctor at the number below.    Please note that while we do our best to be available for urgent issues outside of office hours, we are not available 24/7.   If you have an urgent issue and are unable to reach us, you may choose to seek medical care at your doctor's office, retail clinic, urgent care center, or emergency room.  If you have a medical emergency, please immediately call 911 or go to the emergency department.  Pager Numbers  - Dr. Kowalski: [phone]  - Dr. Moye: [phone]  - Dr. Stewart:  [phone]  In the event of inclement weather, please call our main line at [phone] for an update on the status of any delays or closures.  Dermatology Medication Tips: Please keep the boxes that topical medications come in in order to help keep track of the instructions about where and how to use these. Pharmacies typically print the medication instructions only on the boxes and not directly on the medication tubes.   If your medication is too expensive, please contact our office at [phone] option 4 or send us a message through MyChart.   We are unable to tell what your co-pay for medications will be in advance as this is different depending on your insurance coverage. However, we may be able to find a substitute medication at lower cost or fill out paperwork to get insurance to cover a needed medication.   If a prior authorization is required to get your medication covered by your insurance company, please allow us 1-2 business days to complete this process.  Drug prices often vary depending on where the prescription is filled and some pharmacies may offer cheaper prices.  The website www.goodrx.com contains coupons for medications through different pharmacies. The prices here do not account for what the cost may be with help from insurance (it may be cheaper with your insurance), but the website can give you the price if you did not use any insurance.  - You can print the associated coupon and take it with   your prescription to the pharmacy.  - You may also stop by our office during regular business hours and pick up a GoodRx coupon card.  - If you need your prescription sent electronically to a different pharmacy, notify our office through [place] MyChart or by phone at [phone] option 4.     Si Usted Necesita Algo Despus de Su Visita  Tambin puede enviarnos un mensaje a travs de MyChart. Por lo general respondemos a los mensajes de MyChart en el transcurso de 1 a 2  das hbiles.  Para renovar recetas, por favor pida a su farmacia que se ponga en contacto con nuestra oficina. Nuestro nmero de fax es el [phone].  Si tiene un asunto urgente cuando la clnica est cerrada y que no puede esperar hasta el siguiente da hbil, puede llamar/localizar a su doctor(a) al nmero que aparece a continuacin.   Por favor, tenga en cuenta que aunque hacemos todo lo posible para estar disponibles para asuntos urgentes fuera del horario de oficina, no estamos disponibles las 24 horas del da, los 7 das de la semana.   Si tiene un problema urgente y no puede comunicarse con nosotros, puede optar por buscar atencin mdica  en el consultorio de su doctor(a), en una clnica privada, en un centro de atencin urgente o en una sala de emergencias.  Si tiene una emergencia mdica, por favor llame inmediatamente al 911 o vaya a la sala de emergencias.  Nmeros de bper  - Dr. Kowalski: [phone]  - Dra. Moye: [phone]  - Dra. Stewart: [phone]  En caso de inclemencias del tiempo, por favor llame a nuestra lnea principal al [phone] para una actualizacin sobre el estado de cualquier retraso o cierre.  Consejos para la medicacin en dermatologa: Por favor, guarde las cajas en las que vienen los medicamentos de uso tpico para ayudarle a seguir las instrucciones sobre dnde y cmo usarlos. Las farmacias generalmente imprimen las instrucciones del medicamento slo en las cajas y no directamente en los tubos del medicamento.   Si su medicamento es muy caro, por favor, pngase en contacto con nuestra oficina llamando al [phone] y presione la opcin 4 o envenos un mensaje a travs de MyChart.   No podemos decirle cul ser su copago por los medicamentos por adelantado ya que esto es diferente dependiendo de la cobertura de su seguro. Sin embargo, es posible que podamos encontrar un medicamento sustituto a menor costo o llenar un formulario para que el  seguro cubra el medicamento que se considera necesario.   Si se requiere una autorizacin previa para que su compaa de seguros cubra su medicamento, por favor permtanos de 1 a 2 das hbiles para completar este proceso.  Los precios de los medicamentos varan con frecuencia dependiendo del lugar de dnde se surte la receta y alguna farmacias pueden ofrecer precios ms baratos.  El sitio web www.goodrx.com tiene cupones para medicamentos de diferentes farmacias. Los precios aqu no tienen en cuenta lo que podra costar con la ayuda del seguro (puede ser ms barato con su seguro), pero el sitio web puede darle el precio si no utiliz ningn seguro.  - Puede imprimir el cupn correspondiente y llevarlo con su receta a la farmacia.  - Tambin puede pasar por nuestra oficina durante el horario de atencin regular y recoger una tarjeta de cupones de GoodRx.  - Si necesita que su receta se enve electrnicamente a una farmacia diferente, informe a nuestra oficina a travs de MyChart de [place]   o por telfono llamando al [phone] y presione la opcin 4.  

## 2023-02-06 ENCOUNTER — Encounter: Payer: Self-pay | Admitting: Dermatology

## 2023-02-27 ENCOUNTER — Telehealth: Payer: Self-pay | Admitting: *Deleted

## 2023-02-27 NOTE — Telephone Encounter (Signed)
Pts daughter Steffanie Dunn called stating pts weight is up 4lbs in 4 days and his left leg is swollen. Pt is supposed to go out of town tomorrow but did not want to go until he heard from [employer].  Call back # [phone]  Routed to [facility]

## 2023-02-27 NOTE — Telephone Encounter (Signed)
Spoke with pts daughter she is aware.  

## 2023-03-09 ENCOUNTER — Ambulatory Visit: Payer: Medicare HMO | Attending: Family | Admitting: Family

## 2023-03-09 ENCOUNTER — Encounter: Payer: Self-pay | Admitting: Family

## 2023-03-09 VITALS — BP 172/58 | HR 50 | Wt 211.6 lb

## 2023-03-09 DIAGNOSIS — I4891 Unspecified atrial fibrillation: Secondary | ICD-10-CM | POA: Insufficient documentation

## 2023-03-09 DIAGNOSIS — I480 Paroxysmal atrial fibrillation: Secondary | ICD-10-CM

## 2023-03-09 DIAGNOSIS — I5032 Chronic diastolic (congestive) heart failure: Secondary | ICD-10-CM | POA: Insufficient documentation

## 2023-03-09 DIAGNOSIS — I110 Hypertensive heart disease with heart failure: Secondary | ICD-10-CM | POA: Insufficient documentation

## 2023-03-09 DIAGNOSIS — I2510 Atherosclerotic heart disease of native coronary artery without angina pectoris: Secondary | ICD-10-CM | POA: Diagnosis not present

## 2023-03-09 DIAGNOSIS — J449 Chronic obstructive pulmonary disease, unspecified: Secondary | ICD-10-CM | POA: Insufficient documentation

## 2023-03-09 DIAGNOSIS — Z79899 Other long term (current) drug therapy: Secondary | ICD-10-CM | POA: Diagnosis not present

## 2023-03-09 DIAGNOSIS — Z7901 Long term (current) use of anticoagulants: Secondary | ICD-10-CM | POA: Diagnosis not present

## 2023-03-09 DIAGNOSIS — I10 Essential (primary) hypertension: Secondary | ICD-10-CM | POA: Diagnosis not present

## 2023-03-09 DIAGNOSIS — Z87891 Personal history of nicotine dependence: Secondary | ICD-10-CM | POA: Diagnosis not present

## 2023-03-09 DIAGNOSIS — Z7982 Long term (current) use of aspirin: Secondary | ICD-10-CM | POA: Diagnosis not present

## 2023-03-09 DIAGNOSIS — R338 Other retention of urine: Secondary | ICD-10-CM | POA: Diagnosis not present

## 2023-03-09 DIAGNOSIS — R339 Retention of urine, unspecified: Secondary | ICD-10-CM

## 2023-03-09 DIAGNOSIS — E785 Hyperlipidemia, unspecified: Secondary | ICD-10-CM | POA: Diagnosis not present

## 2023-03-09 DIAGNOSIS — Z8249 Family history of ischemic heart disease and other diseases of the circulatory system: Secondary | ICD-10-CM | POA: Diagnosis not present

## 2023-03-09 MED ORDER — POTASSIUM CHLORIDE CRYS ER 20 MEQ PO TBCR: 20.0000 meq | EXTENDED_RELEASE_TABLET | ORAL | 3 refills | Status: DC | PRN

## 2023-03-09 MED ORDER — METOLAZONE 2.5 MG PO TABS: 2.5000 mg | ORAL_TABLET | ORAL | 0 refills | Status: DC | PRN

## 2023-03-09 NOTE — Progress Notes (Signed)
REDS VEST READING= 37 CHEST RULER=36  VEST FITTING TASKS: POSTURE=sitting HEIGHT MARKER=D CENTER STRIP=aligned  COMMENTS:

## 2023-03-09 NOTE — Patient Instructions (Signed)
Take the metolazone and potassium tomorrow morning and, again, Monday morning. Take this 1/2 hour before your furosemide.

## 2023-03-09 NOTE — Progress Notes (Signed)
Patient ID: David Hardin, male    DOB: 05/16/1927, 89 y.o.   MRN: [id number]  HPI  David Hardin is Hardin 89 y/o male with Hardin history of CAD, hyperlipidemia, HTN, atrial fibrillation, COPD, previous tobacco use and chronic heart failure.   Echo 09/10/22: EF of 60-65% along with mild LVH, moderate LAE, moderate RAE and mild David/AR.   Was in the ED 09/13/22 due to urinary retention after foley removed yesterday. Foley catheter replaced and symptoms improved and he was released. Admitted 09/08/22 due to weight gain and lower extremity edema that didn't improve after lasix increased. Chest CTA negative for PE but positive for HF and pneumonia. Cardiology consult obtained. Initially given IV lasix/ antibiotics with transition to oral medications. Foley placed for urinary retention with subsequent removal and successful voiding. Discharged after 4 days.   He presents today for Hardin HF visit with Hardin chief complaint of pedal edema. Says that this has worsened over the last couple of weeks since taking Hardin trip to [facility]. Has associated SOB & weight gain along with this. Denies difficulty sleeping, dizziness, abdominal distention, palpitations, chest pain, wheezing, cough or fatigue.   Did take Hardin couple of extra doses of furosemide/ potassium without any improvement.   Past Medical History:  Diagnosis Date   (HFpEF) heart failure with preserved ejection fraction ([place]) 05/03/2012   Hardin.) TTE 05/03/2012: EF 50-55%, apical AK, mild AR; b.) TTE 05/11/2022: EF >55%, MAC, mod BAE, mod RVE, triv AR/PR, mild David/TR; c.) TTE 09/10/2022: EF 60-65%, mild LVH, mod BAE, mild David, AoV sclerosis without stenosis   Aortic atherosclerosis (HCC)    Atrial fibrillation (HCC)    Hardin.) CHA2DS2VASc = 5 (age x2, HFpEF, HTN, NSTEMI);  b.) rate/rhythm maintained without pharmacological intervention; chronically anticoagulated with apixaban   BPH (benign prostatic hyperplasia)    Bradycardia    Cardiomegaly    COPD (chronic obstructive  pulmonary disease) ([place])    Coronary artery disease 05/03/2012   Hardin.) LHC 05/03/2012:  EF 56%, apical dyskinesis, 99% mLAD, 70% D1-1, 75% D1-2, 40% mLCx, 50% pRCA, 99% dRCA, 99% RDPA --> consult CVTS; b.) 2v MIDCAB at [facility] (LIMA-LAD, SVG-PDA)   DDD (degenerative disc disease), lumbar    Diverticulosis    DOE (dyspnea on exertion)    Elevated PSA    Erectile dysfunction    Hardin.) on PDE5i (sildenafil) PRN   GI bleed 09/29/2019   History of kidney stones    History of tobacco abuse    Hardin.) quit 2015   Hyperlipidemia    Hypertension    Long term current use of anticoagulant    Hardin.) apixaban   NSTEMI (non-ST elevated myocardial infarction) ([place]) 05/02/2012   Hardin.) NSTEMI 05/02/2012 --> LHC 05/03/2012 [facility] --> EF 56%, apical dyskinesis, 99% mLAD, 70% D1-1, 75% D1-2, 40% mLCx, 50% pRCA, 99% dRCA, 99% RDPA --> consult CVTS; b.) 2v MIDCAB at [facility] (LIMA-LAD, SVG-PDA)   Osteoarthritis    Peripheral edema    Pneumonia    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   Hardin.) 2v MIDCAB --> LIMA-LAD, SVG-PDA   Squamous cell carcinoma of skin 11/21/2021   L dorsum hand - ED&C   Squamous cell carcinoma of skin 11/21/2021   L dorsum forearm - ED&C   Thoracic ascending aortic aneurysm ([place]) 06/16/2020   Hardin.) CT chest 06/16/2020: measured 4.6 x 4.0 cm; b.) CT chest 04/26/2022: measured 4.1 cm; c.) CTA chest 09/08/2022: measured 4.4 cm   Past Surgical History:  Procedure Laterality  Date   BYPASS AXILLA/BRACHIAL ARTERY     CHOLECYSTECTOMY     COLONOSCOPY WITH PROPOFOL N/Hardin 10/02/2019   Procedure: COLONOSCOPY WITH PROPOFOL;  Surgeon: Tol[place], Benay Pike, David Hardin;  Location: [facility] ENDOSCOPY;  Service: Gastroenterology;  Laterality: N/Hardin;   MINIMALLY INVASIVE DIRECT CORONARY ARTERY BYPASS (MIDCAB) Left 05/13/2012   Procedure: MINIMALLY INVASIVE DIRECT CORONARY ARTERY BYPASS (2v MIDCAB); Location: [facility]   PROSTATE BIOPSY N/Hardin 10/19/2022   Procedure: PROSTATE BIOPSY David Hardin;  Surgeon: David Cowper, David Hardin;  Location: [facility] ORS;  Service: Urology;   Laterality: N/Hardin;   TOTAL HIP ARTHROPLASTY Left 2006   Family History  Problem Relation Age of Onset   Breast cancer Mother    Heart attack Father    Social History   Tobacco Use   Smoking status: Former    Types: Cigarettes    Quit date: 2015    Years since quitting: 9.2   Smokeless tobacco: Never  Substance Use Topics   Alcohol use: Yes    Alcohol/week: 5.0 standard drinks of alcohol    Types: 5 Shots of liquor per week   No Known Allergies  Prior to Admission medications   Medication Sig Start Date End Date Taking? Authorizing Provider  albuterol (VENTOLIN HFA) 108 (90 Base) MCG/ACT inhaler Inhale 2 puffs into the lungs every 6 (six) hours as needed. 05/29/22  Yes Provider, Historical, David Hardin  amLODipine (NORVASC) 5 MG tablet Take 5 mg by mouth daily. 10/21/18  Yes Provider, Historical, David Hardin  apixaban (ELIQUIS) 5 MG TABS tablet Take 5 mg by mouth every 12 (twelve) hours. 11/25/18  Yes Provider, Historical, David Hardin  aspirin 81 MG chewable tablet Chew 81 mg by mouth daily.   Yes Provider, Historical, David Hardin  atorvastatin (LIPITOR) 10 MG tablet Take 10 mg by mouth daily. 08/22/22  Yes Provider, Historical, David Hardin  dapagliflozin propanediol (FARXIGA) 10 MG TABS tablet Take 1 tablet (10 mg total) by mouth daily. 09/13/22  Yes David Brady, David Hardin  docusate sodium (COLACE) 100 MG capsule Take 2 capsules (200 mg total) by mouth 2 (two) times daily. 10/19/22  Yes David Cowper, David Hardin  fluticasone [facility]) 50 MCG/ACT nasal spray Place 2 sprays into the nose daily as needed. 04/29/18  Yes Provider, Historical, David Hardin  furosemide (LASIX) 40 MG tablet Take 40 mg by mouth daily. 08/22/22  Yes Provider, Historical, David Hardin  levofloxacin (LEVAQUIN) 500 MG tablet Take 1 tablet (500 mg total) by mouth daily. 10/19/22  Yes David Cowper, David Hardin  losartan (COZAAR) 50 MG tablet Take 1 tablet (50 mg total) by mouth daily. 09/13/22  Yes David Brady, David Hardin  montelukast (SINGULAIR) 10 MG tablet Take 10 mg by mouth at bedtime. 12/06/18   Yes Provider, Historical, David Hardin  Multiple Vitamins-Minerals (PRESERVISION AREDS) CAPS Take 1 capsule by mouth 2 (two) times daily.   Yes Provider, Historical, David Hardin  sildenafil (REVATIO) 20 MG tablet  05/23/18  Yes Provider, Historical, David Hardin  tamsulosin (FLOMAX) 0.4 MG CAPS capsule Take 0.8 mg by mouth daily. Take 1 capsule (0.4 mg total) by mouth once daily Take 30 minutes after same meal each day. 05/25/22  Yes Provider, Historical, David Hardin  TRELEGY ELLIPTA 100-62.5-25 MCG/ACT AEPB Inhale 1 puff into the lungs daily. 08/23/22  Yes Provider, Historical, David Hardin  ipratropium-albuterol (DUONEB) 0.5-2.5 (3) MG/3ML SOLN Take 3 mLs by nebulization every 6 (six) hours as needed (shortness of breath, wheezing.). Patient not taking: Reported on 10/20/2022 12/13/18   David Leber, David Hardin  metolazone (ZAROXOLYN) 2.5 MG tablet Take 1 tablet (2.5 mg  total) by mouth as needed. 03/09/23   David Price Hardin, David Hardin  potassium chloride SA (KLOR-CON M) 20 MEQ tablet Take 1 tablet (20 mEq total) by mouth as needed. Take this if you take metolazone 03/09/23   David Graff, David Hardin   Review of Systems  Constitutional:  Negative for appetite change and fatigue.  HENT:  Negative for congestion, postnasal drip and sore throat.   Eyes: Negative.   Respiratory:  Positive for shortness of breath (last couple of days). Negative for cough, chest tightness and wheezing.   Cardiovascular:  Positive for leg swelling. Negative for chest pain and palpitations.  Gastrointestinal:  Negative for abdominal distention and abdominal pain.  Endocrine: Negative.   Genitourinary: Negative.   Musculoskeletal:  Negative for back pain and neck pain.  Skin: Negative.   Allergic/Immunologic: Negative.   Neurological:  Negative for dizziness and headaches.  Hematological:  Negative for adenopathy. Does not bruise/bleed easily.  Psychiatric/Behavioral:  Negative for dysphoric mood and sleep disturbance. The patient is not nervous/anxious.    Vitals:   03/09/23 1125   BP: (!) 172/58  Pulse: (!) 50  SpO2: 96%  Weight: 211 lb 9.6 oz (96 kg)   Wt Readings from Last 3 Encounters:  03/09/23 211 lb 9.6 oz (96 kg)  10/20/22 205 lb 4 oz (93.1 kg)  10/19/22 200 lb (90.7 kg)   Lab Results  Component Value Date   CREATININE 1.08 09/12/2022   CREATININE 1.06 09/11/2022   CREATININE 1.07 09/10/2022   Physical Exam Vitals and nursing note reviewed. Exam conducted with Hardin chaperone present (daughter).  Constitutional:      Appearance: Normal appearance.  HENT:     Head: Normocephalic and atraumatic.  Cardiovascular:     Rate and Rhythm: Bradycardia present. Rhythm irregular.  Pulmonary:     Effort: Pulmonary effort is normal. No respiratory distress.     Breath sounds: No wheezing.  Abdominal:     General: There is no distension.     Palpations: Abdomen is soft.     Tenderness: There is no abdominal tenderness.  Musculoskeletal:        General: No tenderness.     Cervical back: Normal range of motion and neck supple.     Right lower leg: Edema (2+ pitting) present.     Left lower leg: Edema (2+ pitting) present.  Skin:    General: Skin is warm and dry.  Neurological:     General: No focal deficit present.     Mental Status: He is alert and oriented to person, place, and time.  Psychiatric:        Mood and Affect: Mood normal.        Behavior: Behavior normal.        Thought Content: Thought content normal.    Assessment & Plan:  1: Chronic heart failure with preserved ejection fraction with structural change (LVH)- - NYHA II - mildly fluid overloaded with weight gain, edema and elevated ReDs reading - weighing daily; reminded to call for an overnight weight gain of > 2 pounds or Hardin weekly weight gain of > 5 pounds - weight up 6 pounds from last visit here 4 months ago; home weight has risen from 201-210 pounds - echo 09/10/22: EF of 60-65% along with mild LVH, moderate LAE, moderate RAE and mild David/AR. - not adding salt and his daughters are  cooking meals for him; did go to [facility] and daughter notes that symptoms started after his return -  continue farxiga 10mg  daily - continue losartan 50mg  daily - ReDs clip reading was 37% - advised to take 2.5mg  metolazone/ 35meq potassium tomorrow (Sat) and, again on Monday - will check BMP next week - likes to be active and encouraged him to be active but if he starts to feel SOB/ fatigued to stop and rest for Hardin few minutes - BNP 09/08/22 was 544.8  2: HTN- - BP 172/58 - saw PCP David Hardin) 01/16/23 - BMP 09/13/22 reviewed and showed sodium 142, potassium 3.8, creatinine 1.16 and GFR 59  3: Atrial fibrillation- - saw cardiology (David Hardin) 01/30/23 - HR (I) but he says that he doesn't notice palpitations - continue apixaban 5mg  BID - continue aspirin 81mg  daily  4: COPD- - saw pulmonology David Hardin) 07/28/22 - using trelegy, nebulizer and sildenafil  5: Urinary retention- - prostate biopsy done 10/19/22   Medication list reviewed.   Return next week for recheck of symptoms and to check labs. Sooner if needed

## 2023-03-13 NOTE — Progress Notes (Unsigned)
Patient ID: David Hardin, male    DOB: 20-Nov-1927, 89 y.o.   MRN: [id number]  HPI  David Hardin is a 89 y/o male with a history of CAD, hyperlipidemia, HTN, atrial fibrillation, COPD, previous tobacco use and chronic heart failure.   Echo 09/10/22: EF of 60-65% along with mild LVH, moderate LAE, moderate RAE and mild David/AR.   Was in the ED 09/13/22 due to urinary retention after foley removed yesterday. Foley catheter replaced and symptoms improved and he was released.   He presents today for a HF visit with a chief complaint of minimal edema in lower legs. Intermittent in nature but much improved from last visit here. Has no other symptoms and specifically denies difficulty sleeping, dizziness, abdominal distention, palpitations, chest pain, SOB, cough, fatigue or weight gain.   Took 2 doses of metolazone 2.5mg  / 81meq potassium since last here 5 days ago.   Past Medical History:  Diagnosis Date   (HFpEF) heart failure with preserved ejection fraction ([place]) 05/03/2012   a.) TTE 05/03/2012: EF 50-55%, apical AK, mild AR; b.) TTE 05/11/2022: EF >55%, MAC, mod BAE, mod RVE, triv AR/PR, mild David/TR; c.) TTE 09/10/2022: EF 60-65%, mild LVH, mod BAE, mild David, AoV sclerosis without stenosis   Aortic atherosclerosis (HCC)    Atrial fibrillation (HCC)    a.) CHA2DS2VASc = 5 (age x2, HFpEF, HTN, NSTEMI);  b.) rate/rhythm maintained without pharmacological intervention; chronically anticoagulated with apixaban   BPH (benign prostatic hyperplasia)    Bradycardia    Cardiomegaly    COPD (chronic obstructive pulmonary disease) ([place])    Coronary artery disease 05/03/2012   a.) LHC 05/03/2012:  EF 56%, apical dyskinesis, 99% mLAD, 70% D1-1, 75% D1-2, 40% mLCx, 50% pRCA, 99% dRCA, 99% RDPA --> consult CVTS; b.) 2v MIDCAB at [facility] (LIMA-LAD, SVG-PDA)   DDD (degenerative disc disease), lumbar    Diverticulosis    DOE (dyspnea on exertion)    Elevated PSA    Erectile dysfunction    a.) on PDE5i (sildenafil)  PRN   GI bleed 09/29/2019   History of kidney stones    History of tobacco abuse    a.) quit 2015   Hyperlipidemia    Hypertension    Long term current use of anticoagulant    a.) apixaban   NSTEMI (non-ST elevated myocardial infarction) ([place]) 05/02/2012   a.) NSTEMI 05/02/2012 --> LHC 05/03/2012 at [facility] --> EF 56%, apical dyskinesis, 99% mLAD, 70% D1-1, 75% D1-2, 40% mLCx, 50% pRCA, 99% dRCA, 99% RDPA --> consult CVTS; b.) 2v MIDCAB at [facility] (LIMA-LAD, SVG-PDA)   Osteoarthritis    Peripheral edema    Pneumonia    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   a.) 2v MIDCAB --> LIMA-LAD, SVG-PDA   Squamous cell carcinoma of skin 11/21/2021   L dorsum hand - ED&C   Squamous cell carcinoma of skin 11/21/2021   L dorsum forearm - ED&C   Thoracic ascending aortic aneurysm ([place]) 06/16/2020   a.) CT chest 06/16/2020: measured 4.6 x 4.0 cm; b.) CT chest 04/26/2022: measured 4.1 cm; c.) CTA chest 09/08/2022: measured 4.4 cm   Past Surgical History:  Procedure Laterality Date   BYPASS AXILLA/BRACHIAL ARTERY     CHOLECYSTECTOMY     COLONOSCOPY WITH PROPOFOL N/A 10/02/2019   Procedure: COLONOSCOPY WITH PROPOFOL;  Surgeon: Toledo, Benay Pike, MD;  Location: [facility] ENDOSCOPY;  Service: Gastroenterology;  Laterality: N/A;   MINIMALLY INVASIVE DIRECT CORONARY ARTERY BYPASS (MIDCAB) Left 05/13/2012   Procedure: MINIMALLY INVASIVE DIRECT  CORONARY ARTERY BYPASS (2v MIDCAB); Location: [facility]   PROSTATE BIOPSY N/A 10/19/2022   Procedure: PROSTATE BIOPSY Vernelle Emerald;  Surgeon: Royston Cowper, MD;  Location: [facility] ORS;  Service: Urology;  Laterality: N/A;   TOTAL HIP ARTHROPLASTY Left 2006   Family History  Problem Relation Age of Onset   Breast cancer Mother    Heart attack Father    Social History   Tobacco Use   Smoking status: Former    Types: Cigarettes    Quit date: 2015    Years since quitting: 9.2   Smokeless tobacco: Never  Substance Use Topics   Alcohol use: Yes    Alcohol/week: 5.0 standard drinks of alcohol     Types: 5 Shots of liquor per week   No Known Allergies  Prior to Admission medications   Medication Sig Start Date End Date Taking? Authorizing Provider  amLODipine (NORVASC) 5 MG tablet Take 5 mg by mouth daily. 10/21/18  Yes [provider]  apixaban (ELIQUIS) 5 MG TABS tablet Take 5 mg by mouth every 12 (twelve) hours. 11/25/18  Yes [provider]  aspirin 81 MG chewable tablet Chew 81 mg by mouth daily.   Yes [provider]  atorvastatin (LIPITOR) 10 MG tablet Take 10 mg by mouth daily. 08/22/22  Yes [provider]  dapagliflozin propanediol (FARXIGA) 10 MG TABS tablet Take 1 tablet (10 mg total) by mouth daily. 09/13/22  Yes Duard Brady, MD  docusate sodium (COLACE) 100 MG capsule Take 2 capsules (200 mg total) by mouth 2 (two) times daily. 10/19/22  Yes Royston Cowper, MD  dutasteride (AVODART) 0.5 MG capsule Take 0.5 mg by mouth daily. 02/21/23  Yes [provider]  furosemide (LASIX) 40 MG tablet Take 40 mg by mouth daily. 08/22/22  Yes [provider]  losartan (COZAAR) 50 MG tablet Take 1 tablet (50 mg total) by mouth daily. 09/13/22  Yes Duard Brady, MD  metolazone (ZAROXOLYN) 2.5 MG tablet Take 1 tablet (2.5 mg total) by mouth as needed. 03/09/23  Yes Adair Lauderback A, FNP  montelukast (SINGULAIR) 10 MG tablet Take 10 mg by mouth at bedtime. 12/06/18  Yes [provider]  Multiple Vitamins-Minerals (PRESERVISION AREDS) CAPS Take 1 capsule by mouth 2 (two) times daily.   Yes [provider]  potassium chloride SA (KLOR-CON M) 20 MEQ tablet Take 1 tablet (20 mEq total) by mouth as needed. Take this if you take metolazone 03/09/23  Yes Darylene Price A, FNP  tamsulosin (FLOMAX) 0.4 MG CAPS capsule Take 0.8 mg by mouth daily. Take 1 capsule (0.4 mg total) by mouth once daily Take 30 minutes after same meal each day. 05/25/22  Yes [provider]  TRELEGY ELLIPTA 100-62.5-25 MCG/ACT AEPB Inhale 1 puff  into the lungs daily. 08/23/22  Yes [provider]  albuterol (VENTOLIN HFA) 108 (90 Base) MCG/ACT inhaler Inhale 2 puffs into the lungs every 6 (six) hours as needed. Patient not taking: Reported on 03/14/2023 05/29/22   [provider]  fluticasone (FLONASE) 50 MCG/ACT nasal spray Place 2 sprays into the nose daily as needed. Patient not taking: Reported on 03/14/2023 04/29/18   [provider]  ipratropium-albuterol (DUONEB) 0.5-2.5 (3) MG/3ML SOLN Take 3 mLs by nebulization every 6 (six) hours as needed (shortness of breath, wheezing.). Patient not taking: Reported on 10/20/2022 12/13/18   Henreitta Leber, MD    Review of Systems  Constitutional:  Negative for appetite change and fatigue.  HENT:  Negative for  congestion, postnasal drip and sore throat.   Eyes: Negative.   Respiratory:  Negative for cough, chest tightness, shortness of breath and wheezing.   Cardiovascular:  Positive for leg swelling. Negative for chest pain and palpitations.  Gastrointestinal:  Negative for abdominal distention and abdominal pain.  Endocrine: Negative.   Genitourinary: Negative.   Musculoskeletal:  Negative for back pain and neck pain.  Skin: Negative.   Allergic/Immunologic: Negative.   Neurological:  Negative for dizziness and headaches.  Hematological:  Negative for adenopathy. Does not bruise/bleed easily.  Psychiatric/Behavioral:  Negative for dysphoric mood and sleep disturbance. The patient is not nervous/anxious.    Vitals:   03/14/23 0953  BP: (!) 143/83  Pulse: (!) 56  SpO2: 100%  Weight: 201 lb 4 oz (91.3 kg)   Wt Readings from Last 3 Encounters:  03/14/23 201 lb 4 oz (91.3 kg)  03/09/23 211 lb 9.6 oz (96 kg)  10/20/22 205 lb 4 oz (93.1 kg)   Lab Results  Component Value Date   CREATININE 1.08 09/12/2022   CREATININE 1.06 09/11/2022   CREATININE 1.07 09/10/2022   Physical Exam Vitals and nursing note reviewed. Exam conducted with a chaperone present  (daughter).  Constitutional:      Appearance: Normal appearance.  HENT:     Head: Normocephalic and atraumatic.  Cardiovascular:     Rate and Rhythm: Bradycardia present. Rhythm irregular.  Pulmonary:     Effort: Pulmonary effort is normal. No respiratory distress.     Breath sounds: No wheezing.  Abdominal:     General: There is no distension.     Palpations: Abdomen is soft.     Tenderness: There is no abdominal tenderness.  Musculoskeletal:        General: No tenderness.     Cervical back: Normal range of motion and neck supple.     Right lower leg: Edema (trace pitting) present.     Left lower leg: No edema.  Skin:    General: Skin is warm and dry.  Neurological:     General: No focal deficit present.     Mental Status: He is alert and oriented to person, place, and time.  Psychiatric:        Mood and Affect: Mood normal.        Behavior: Behavior normal.        Thought Content: Thought content normal.    Assessment & Plan:  1: Chronic heart failure with preserved ejection fraction with LVH- - NYHA I - euvolemic today - weighing daily; reminded to call for an overnight weight gain of > 2 pounds or a weekly weight gain of > 5 pounds - weight down 10 pounds from last visit here 1 week ago - echo 09/10/22: EF of 60-65% along with mild LVH, moderate LAE, moderate RAE and mild David/AR. - not adding salt and his daughters are cooking meals for him - continue farxiga 10mg  daily - continue losartan 50mg  daily - ReDs clip reading last week was 37%; today is now 35% - BMP today as he took 2.5mg  metolazone/ 51meq potassium twice since last here 5 days ago - likes to be active and encouraged him to be active but if he starts to feel SOB/ fatigued to stop and rest for a few minutes - BNP 09/08/22 was 544.8 - PharmD reconciled meds w/ patient  2: HTN- - BP 143/83 - plan to stop amlodipine and begin spironolactone 25mg  daily once we get his labs back - meds are bubble  packed so he  can finish his current pack and then will start spironolactone - saw PCP Baldemar Lenis) 01/16/23 - BMP 09/12/22 reviewed and showed sodium 141, potassium 3.3, creatinine 1.08 and GFR >60  3: Atrial fibrillation- - saw cardiology ([facility]) 01/30/23 - HR (I) but he says that he doesn't notice palpitations - continue apixaban 5mg  BID - continue aspirin 81mg  daily  4: COPD- - saw pulmonology Raul Del) 07/28/22 - using trelegy, nebulizer and sildenafil  5: Urinary retention- - prostate biopsy done 10/19/22   Return in 6 weeks, sooner if needed.

## 2023-03-14 ENCOUNTER — Encounter: Payer: Self-pay | Admitting: Pharmacist

## 2023-03-14 ENCOUNTER — Telehealth: Payer: Self-pay

## 2023-03-14 ENCOUNTER — Other Ambulatory Visit: Payer: Self-pay | Admitting: Family

## 2023-03-14 ENCOUNTER — Encounter: Payer: Self-pay | Admitting: Family

## 2023-03-14 ENCOUNTER — Other Ambulatory Visit
Admission: RE | Admit: 2023-03-14 | Discharge: 2023-03-14 | Disposition: A | Discharge status: Home / Self Care | Payer: Medicare HMO | Source: Ambulatory Visit | Attending: Family | Admitting: Family

## 2023-03-14 ENCOUNTER — Ambulatory Visit (HOSPITAL_BASED_OUTPATIENT_CLINIC_OR_DEPARTMENT_OTHER): Payer: Medicare HMO | Admitting: Family

## 2023-03-14 VITALS — BP 143/83 | HR 56 | Wt 201.2 lb

## 2023-03-14 DIAGNOSIS — J449 Chronic obstructive pulmonary disease, unspecified: Secondary | ICD-10-CM

## 2023-03-14 DIAGNOSIS — I480 Paroxysmal atrial fibrillation: Secondary | ICD-10-CM

## 2023-03-14 DIAGNOSIS — R339 Retention of urine, unspecified: Secondary | ICD-10-CM

## 2023-03-14 DIAGNOSIS — I5032 Chronic diastolic (congestive) heart failure: Secondary | ICD-10-CM

## 2023-03-14 DIAGNOSIS — I10 Essential (primary) hypertension: Secondary | ICD-10-CM

## 2023-03-14 LAB — BASIC METABOLIC PANEL
Anion gap: 9 (ref 5–15)
BUN: 27 mg/dL — ABNORMAL HIGH (ref 8–23)
CO2: 29 mmol/L (ref 22–32)
Calcium: 9.5 mg/dL (ref 8.9–10.3)
Chloride: 102 mmol/L (ref 98–111)
Creatinine, Ser: 1.25 mg/dL — ABNORMAL HIGH (ref 0.61–1.24)
GFR, Estimated: 54 mL/min — ABNORMAL LOW (ref >60)
Glucose, Bld: 103 mg/dL — ABNORMAL HIGH (ref 70–99)
Potassium: 3.3 mmol/L — ABNORMAL LOW (ref 3.5–5.1)
Sodium: 140 mmol/L (ref 135–145)

## 2023-03-14 MED ORDER — SPIRONOLACTONE 25 MG PO TABS: 25.0000 mg | ORAL_TABLET | Freq: Every day | ORAL | 5 refills | Status: DC

## 2023-03-14 NOTE — Patient Instructions (Signed)
We will call you after we get your lab work back.

## 2023-03-14 NOTE — Telephone Encounter (Signed)
David Handler, RN      Unable to reach daughter at this time. Left vm to call back. Spoke with pt's wife David Hardin) gave the medications instructions to start spironolactone 25 mg daily with next pill pack and to finish amlodipine in current pack and then to stop. pt & wife were agreeable with getting labs rechecked after being on spironolactone for 1 wk. Pt & wife aware, agreeable, and verbalized understanding with plan.   David Graff, FNP      Please call daughter: Potassium is a little low but we are starting spironolactone 25mg  daily with your next pill pack. This medication helps your body hold onto potassium Finish the amlodipine in your current pack and then it will be stopped. After being on spironolactone for 1 week, please go to the [facility] and get labs checked to make sure potassium and kidney function are stable.    Lab orders placed.

## 2023-03-14 NOTE — Progress Notes (Signed)
Patient ID: David Hardin, male   DOB: 07-09-1927, 89 y.o.   MRN: [id number] [place] - PHARMACIST COUNSELING NOTE  *HFpEF*  Guideline-Directed Medical Therapy/Evidence Based Medicine  ACE/ARB/ARNI: Losartan 50 mg daily Beta Blocker:  none Aldosterone Antagonist:  none Diuretic: Furosemide 40 mg daily and metolazone 2.5mg  2x/week SGLT2i: Dapagliflozin 10 mg daily  Adherence Assessment  Do you ever forget to take your medication? [] Yes [] No  Do you ever skip doses due to side effects? [] Yes [] No  Do you have trouble affording your medicines? [] Yes [] No  Are you ever unable to pick up your medication due to transportation difficulties? [] Yes [] No  Do you ever stop taking your medications because you don't believe they are helping? [] Yes [] No  Do you check your weight daily? [] Yes [] No   Adherence strategy: ***  Barriers to obtaining medications: ***  Vital signs: HR ***, BP ***, weight (pounds) *** ECHO: Date ***, EF ***, notes *** Cath: Date ***, EF ***, notes ***     Latest Ref Rng & Units 09/12/2022    5:45 AM 09/11/2022    5:35 AM 09/10/2022    4:36 AM  BMP  Glucose 70 - 99 mg/dL 103  91  105   BUN 8 - 23 mg/dL 24  24  24    Creatinine 0.61 - 1.24 mg/dL 1.08  1.06  1.07   Sodium 135 - 145 mmol/L 141  139  142   Potassium 3.5 - 5.1 mmol/L 3.3  3.4  2.8   Chloride 98 - 111 mmol/L 100  98  99   CO2 22 - 32 mmol/L 32  30  32   Calcium 8.9 - 10.3 mg/dL 8.7  8.5  8.6     Past Medical History:  Diagnosis Date   (HFpEF) heart failure with preserved ejection fraction ([place]) 05/03/2012   a.) TTE 05/03/2012: EF 50-55%, apical AK, mild AR; b.) TTE 05/11/2022: EF >55%, MAC, mod BAE, mod RVE, triv AR/PR, mild MR/TR; c.) TTE 09/10/2022: EF 60-65%, mild LVH, mod BAE, mild MR, AoV sclerosis without stenosis   Aortic atherosclerosis (HCC)    Atrial fibrillation (HCC)    a.) CHA2DS2VASc = 5 (age x2, HFpEF, HTN, NSTEMI);  b.)  rate/rhythm maintained without pharmacological intervention; chronically anticoagulated with apixaban   BPH (benign prostatic hyperplasia)    Bradycardia    Cardiomegaly    COPD (chronic obstructive pulmonary disease) ([place])    Coronary artery disease 05/03/2012   a.) LHC 05/03/2012:  EF 56%, apical dyskinesis, 99% mLAD, 70% D1-1, 75% D1-2, 40% mLCx, 50% pRCA, 99% dRCA, 99% RDPA --> consult CVTS; b.) 2v MIDCAB at [facility] (LIMA-LAD, SVG-PDA)   DDD (degenerative disc disease), lumbar    Diverticulosis    DOE (dyspnea on exertion)    Elevated PSA    Erectile dysfunction    a.) on PDE5i (sildenafil) PRN   GI bleed 09/29/2019   History of kidney stones    History of tobacco abuse    a.) quit 2015   Hyperlipidemia    Hypertension    Long term current use of anticoagulant    a.) apixaban   NSTEMI (non-ST elevated myocardial infarction) ([place]) 05/02/2012   a.) NSTEMI 05/02/2012 --> LHC 05/03/2012 at [facility] --> EF 56%, apical dyskinesis, 99% mLAD, 70% D1-1, 75% D1-2, 40% mLCx, 50% pRCA, 99% dRCA, 99% RDPA --> consult CVTS; b.) 2v MIDCAB at [facility] (LIMA-LAD, SVG-PDA)   Osteoarthritis    Peripheral edema  Pneumonia    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   a.) 2v MIDCAB --> LIMA-LAD, SVG-PDA   Squamous cell carcinoma of skin 11/21/2021   L dorsum hand - ED&C   Squamous cell carcinoma of skin 11/21/2021   L dorsum forearm - ED&C   Thoracic ascending aortic aneurysm ([place]) 06/16/2020   a.) CT chest 06/16/2020: measured 4.6 x 4.0 cm; b.) CT chest 04/26/2022: measured 4.1 cm; c.) CTA chest 09/08/2022: measured 4.4 cm    ASSESSMENT *** year old {Gender Description:[id number]} who presents to the HF clinic ***  Recent ED Visit (past 6 months): Date - ***, CC - ***  PLAN     Time spent: *** minutes  Corinthian Kemler Rodriguez-Guzman PharmD, BCPS 03/14/2023 8:34 AM    Current Outpatient Medications:    albuterol (VENTOLIN HFA) 108 (90 Base) MCG/ACT inhaler, Inhale 2 puffs into the lungs every 6 (six) hours as  needed., Disp: , Rfl:    amLODipine (NORVASC) 5 MG tablet, Take 5 mg by mouth daily., Disp: , Rfl:    apixaban (ELIQUIS) 5 MG TABS tablet, Take 5 mg by mouth every 12 (twelve) hours., Disp: , Rfl:    aspirin 81 MG chewable tablet, Chew 81 mg by mouth daily., Disp: , Rfl:    atorvastatin (LIPITOR) 10 MG tablet, Take 10 mg by mouth daily., Disp: , Rfl:    dapagliflozin propanediol (FARXIGA) 10 MG TABS tablet, Take 1 tablet (10 mg total) by mouth daily., Disp: 30 tablet, Rfl: 1   docusate sodium (COLACE) 100 MG capsule, Take 2 capsules (200 mg total) by mouth 2 (two) times daily., Disp: 120 capsule, Rfl: 3   fluticasone (FLONASE) 50 MCG/ACT nasal spray, Place 2 sprays into the nose daily as needed., Disp: , Rfl:    furosemide (LASIX) 40 MG tablet, Take 40 mg by mouth daily., Disp: , Rfl:    ipratropium-albuterol (DUONEB) 0.5-2.5 (3) MG/3ML SOLN, Take 3 mLs by nebulization every 6 (six) hours as needed (shortness of breath, wheezing.). (Patient not taking: Reported on 10/20/2022), Disp: 360 mL, Rfl: 0   levofloxacin (LEVAQUIN) 500 MG tablet, Take 1 tablet (500 mg total) by mouth daily., Disp: 7 tablet, Rfl: 0   losartan (COZAAR) 50 MG tablet, Take 1 tablet (50 mg total) by mouth daily., Disp: 30 tablet, Rfl: 1   metolazone (ZAROXOLYN) 2.5 MG tablet, Take 1 tablet (2.5 mg total) by mouth as needed., Disp: 10 tablet, Rfl: 0   montelukast (SINGULAIR) 10 MG tablet, Take 10 mg by mouth at bedtime., Disp: , Rfl:    Multiple Vitamins-Minerals (PRESERVISION AREDS) CAPS, Take 1 capsule by mouth 2 (two) times daily., Disp: , Rfl:    potassium chloride SA (KLOR-CON M) 20 MEQ tablet, Take 1 tablet (20 mEq total) by mouth as needed. Take this if you take metolazone, Disp: 10 tablet, Rfl: 3   sildenafil (REVATIO) 20 MG tablet, , Disp: , Rfl:    tamsulosin (FLOMAX) 0.4 MG CAPS capsule, Take 0.8 mg by mouth daily. Take 1 capsule (0.4 mg total) by mouth once daily Take 30 minutes after same meal each day., Disp: , Rfl:     TRELEGY ELLIPTA 100-62.5-25 MCG/ACT AEPB, Inhale 1 puff into the lungs daily., Disp: , Rfl:    MEDICATION ADHERENCES TIPS AND STRATEGIES Taking medication as prescribed improves patient outcomes in heart failure (reduces hospitalizations, improves symptoms, increases survival) Side effects of medications can be managed by decreasing doses, switching agents, stopping drugs, or adding additional therapy. Please let someone in the Heart  Failure Clinic know if you have having bothersome side effects so we can modify your regimen. Do not alter your medication regimen without talking to [place].  Medication reminders can help patients remember to take drugs on time. If you are missing or forgetting doses you can try linking behaviors, using pill boxes, or an electronic reminder like an alarm on your phone or an app. Some people can also get automated phone calls as medication reminders.   Marland KitchenHF

## 2023-03-14 NOTE — Progress Notes (Signed)
REDS VEST READING= 35 CHEST RULER=36  VEST FITTING TASKS: POSTURE=sitting HEIGHT MARKER=D CENTER STRIP=aligned  COMMENTS:

## 2023-04-13 ENCOUNTER — Telehealth: Payer: Self-pay

## 2023-04-13 ENCOUNTER — Other Ambulatory Visit
Admission: RE | Admit: 2023-04-13 | Discharge: 2023-04-13 | Disposition: A | Discharge status: Home / Self Care | Payer: Medicare HMO | Attending: Family | Admitting: Family

## 2023-04-13 DIAGNOSIS — I5033 Acute on chronic diastolic (congestive) heart failure: Secondary | ICD-10-CM

## 2023-04-13 DIAGNOSIS — I5032 Chronic diastolic (congestive) heart failure: Secondary | ICD-10-CM

## 2023-04-13 LAB — BASIC METABOLIC PANEL
Anion gap: 9 (ref 5–15)
BUN: 28 mg/dL — ABNORMAL HIGH (ref 8–23)
CO2: 25 mmol/L (ref 22–32)
Calcium: 8.9 mg/dL (ref 8.9–10.3)
Chloride: 107 mmol/L (ref 98–111)
Creatinine, Ser: 1.04 mg/dL (ref 0.61–1.24)
GFR, Estimated: >60 mL/min (ref >60)
Glucose, Bld: 128 mg/dL — ABNORMAL HIGH (ref 70–99)
Potassium: 3.4 mmol/L — ABNORMAL LOW (ref 3.5–5.1)
Sodium: 141 mmol/L (ref 135–145)

## 2023-04-13 NOTE — Telephone Encounter (Addendum)
Spoke with pts wife [name] regarding Clarisa Kindred , FNP recommendations. Pt wife aware, agreeable, and verbalized understanding of taking 20 mEq potassium tablet twice a week. Will come Friday 04/20/23 for repeat lab work.  ----- Message from Delma Freeze, [place] sent at 04/13/2023 11:38 AM EDT ----- Kidney function looks great! Potassium is a little low so take potassium tablet twice a week and we will check labs at next visit.

## 2023-04-16 ENCOUNTER — Telehealth: Payer: Self-pay

## 2023-04-16 NOTE — Telephone Encounter (Signed)
Patient's daughter Silva Bandy called to verify Potassium prescription instructions which is 1 tablet twice a week and have lab drawn on 04/20/23. Daughter states that patient will be out of town but will come in on 04/23/23 for the lab draw. Silva Bandy also asked if clinic staff could call her first for any results, appointments, and medication changes.

## 2023-04-24 ENCOUNTER — Other Ambulatory Visit
Admission: RE | Admit: 2023-04-24 | Discharge: 2023-04-24 | Disposition: A | Discharge status: Home / Self Care | Payer: Medicare HMO | Attending: Family | Admitting: Family

## 2023-04-24 DIAGNOSIS — I5033 Acute on chronic diastolic (congestive) heart failure: Secondary | ICD-10-CM

## 2023-04-24 LAB — BASIC METABOLIC PANEL
Anion gap: 7 (ref 5–15)
BUN: 33 mg/dL — ABNORMAL HIGH (ref 8–23)
CO2: 24 mmol/L (ref 22–32)
Calcium: 8.8 mg/dL — ABNORMAL LOW (ref 8.9–10.3)
Chloride: 108 mmol/L (ref 98–111)
Creatinine, Ser: 1.23 mg/dL (ref 0.61–1.24)
GFR, Estimated: 55 mL/min — ABNORMAL LOW (ref >60)
Glucose, Bld: 95 mg/dL (ref 70–99)
Potassium: 4 mmol/L (ref 3.5–5.1)
Sodium: 139 mmol/L (ref 135–145)

## 2023-05-09 ENCOUNTER — Other Ambulatory Visit
Admission: RE | Admit: 2023-05-09 | Discharge: 2023-05-09 | Disposition: A | Discharge status: Home / Self Care | Payer: Medicare HMO | Source: Ambulatory Visit | Attending: Family | Admitting: Family

## 2023-05-09 ENCOUNTER — Encounter: Payer: Self-pay | Admitting: Family

## 2023-05-09 ENCOUNTER — Ambulatory Visit (HOSPITAL_BASED_OUTPATIENT_CLINIC_OR_DEPARTMENT_OTHER): Payer: Medicare HMO | Admitting: Family

## 2023-05-09 VITALS — BP 134/63 | HR 49 | Ht 67.0 in | Wt 204.0 lb

## 2023-05-09 DIAGNOSIS — I480 Paroxysmal atrial fibrillation: Secondary | ICD-10-CM

## 2023-05-09 DIAGNOSIS — I5032 Chronic diastolic (congestive) heart failure: Secondary | ICD-10-CM

## 2023-05-09 DIAGNOSIS — I10 Essential (primary) hypertension: Secondary | ICD-10-CM

## 2023-05-09 DIAGNOSIS — J449 Chronic obstructive pulmonary disease, unspecified: Secondary | ICD-10-CM | POA: Diagnosis not present

## 2023-05-09 LAB — BASIC METABOLIC PANEL
Anion gap: 9 (ref 5–15)
BUN: 28 mg/dL — ABNORMAL HIGH (ref 8–23)
CO2: 26 mmol/L (ref 22–32)
Calcium: 8.8 mg/dL — ABNORMAL LOW (ref 8.9–10.3)
Chloride: 106 mmol/L (ref 98–111)
Creatinine, Ser: 1 mg/dL (ref 0.61–1.24)
GFR, Estimated: >60 mL/min (ref >60)
Glucose, Bld: 89 mg/dL (ref 70–99)
Potassium: 3.8 mmol/L (ref 3.5–5.1)
Sodium: 141 mmol/L (ref 135–145)

## 2023-05-09 NOTE — Progress Notes (Signed)
PCP: Kandyce Rud, MD (last seen 05/24) Primary Cardiologist: Marcina Millard, MD (last seen 02/24)  HPI:  Mr Holtzclaw is a 89 y/o male with a history of CAD, hyperlipidemia, HTN, atrial fibrillation, COPD, previous tobacco use and chronic heart failure.   Echo 09/10/22: EF of 60-65% along with mild LVH, moderate LAE, moderate RAE and mild MR/AR.   Was in the ED 09/13/22 due to urinary retention after foley removed yesterday. Foley catheter replaced and symptoms improved and he was released.   He presents today with a chief complaint of a HF visit. Currently denies any fatigue, SOB, cough, palpitations, abdominal/ pedal edema, dizziness, difficulty sleeping or overnight weight gain. Says that he hasn't taken any metolazone since the last time he was here. Overall, he voices no complaints and says that he feels good.   ROS: All systems negative except as listed in HPI, PMH and Problem List.  SH:  Social History   Socioeconomic History   Marital status: Married    Spouse name: [name]   Number of children: Not on file   Years of education: Not on file   Highest education level: Not on file  Occupational History   Occupation: retired  Tobacco Use   Smoking status: Former    Types: Cigarettes    Quit date: 2015    Years since quitting: 9.4   Smokeless tobacco: Never  Vaping Use   Vaping Use: Never used  Substance and Sexual Activity   Alcohol use: Yes    Alcohol/week: 5.0 standard drinks of alcohol    Types: 5 Shots of liquor per week   Drug use: No   Sexual activity: Never  Other Topics Concern   Not on file  Social History Narrative   Not on file   Social Determinants of Health   Financial Resource Strain: Not on file  Food Insecurity: No Food Insecurity (09/09/2022)   Hunger Vital Sign    Worried About Running Out of Food in the Last Year: Never true    Ran Out of Food in the Last Year: Never true  Transportation Needs: No Transportation Needs (09/09/2022)    PRAPARE - Administrator, Civil Service (Medical): No    Lack of Transportation (Non-Medical): No  Physical Activity: Not on file  Stress: Not on file  Social Connections: Not on file  Intimate Partner Violence: Not At Risk (09/09/2022)   Humiliation, Afraid, Rape, and Kick questionnaire    Fear of Current or Ex-Partner: No    Emotionally Abused: No    Physically Abused: No    Sexually Abused: No    FH:  Family History  Problem Relation Age of Onset   Breast cancer Mother    Heart attack Father     Past Medical History:  Diagnosis Date   (HFpEF) heart failure with preserved ejection fraction (HCC) 05/03/2012   a.) TTE 05/03/2012: EF 50-55%, apical AK, mild AR; b.) TTE 05/11/2022: EF >55%, MAC, mod BAE, mod RVE, triv AR/PR, mild MR/TR; c.) TTE 09/10/2022: EF 60-65%, mild LVH, mod BAE, mild MR, AoV sclerosis without stenosis   Aortic atherosclerosis (HCC)    Atrial fibrillation (HCC)    a.) CHA2DS2VASc = 5 (age x2, HFpEF, HTN, NSTEMI);  b.) rate/rhythm maintained without pharmacological intervention; chronically anticoagulated with apixaban   BPH (benign prostatic hyperplasia)    Bradycardia    Cardiomegaly    COPD (chronic obstructive pulmonary disease) (HCC)    Coronary artery disease 05/03/2012   a.) LHC 05/03/2012:  EF 56%, apical dyskinesis, 99% mLAD, 70% D1-1, 75% D1-2, 40% mLCx, 50% pRCA, 99% dRCA, 99% RDPA --> consult CVTS; b.) 2v MIDCAB at [facility] (LIMA-LAD, SVG-PDA)   DDD (degenerative disc disease), lumbar    Diverticulosis    DOE (dyspnea on exertion)    Elevated PSA    Erectile dysfunction    a.) on PDE5i (sildenafil) PRN   GI bleed 09/29/2019   History of kidney stones    History of tobacco abuse    a.) quit 2015   Hyperlipidemia    Hypertension    Long term current use of anticoagulant    a.) apixaban   NSTEMI (non-ST elevated myocardial infarction) (HCC) 05/02/2012   a.) NSTEMI 05/02/2012 --> LHC 05/03/2012 at [facility] --> EF 56%, apical dyskinesis,  99% mLAD, 70% D1-1, 75% D1-2, 40% mLCx, 50% pRCA, 99% dRCA, 99% RDPA --> consult CVTS; b.) 2v MIDCAB at [facility] (LIMA-LAD, SVG-PDA)   Osteoarthritis    Peripheral edema    Pneumonia    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 (Patient not taking: Reported on 03/14/2023)     apixaban (ELIQUIS) 5 MG TABS tablet Take 5 mg by mouth every 12 (twelve) hours.     aspirin 81 MG chewable tablet Chew 81 mg by mouth daily.     atorvastatin (LIPITOR) 10 MG tablet Take 10 mg by mouth daily.     dapagliflozin propanediol (FARXIGA) 10 MG TABS tablet Take 1 tablet (10 mg total) by mouth daily. 30 tablet 1   docusate sodium (COLACE) 100 MG capsule Take 2 capsules (200 mg total) by mouth 2 (two) times daily. 120 capsule 3   dutasteride (AVODART) 0.5 MG capsule Take 0.5 mg by mouth daily.     fluticasone (FLONASE) 50 MCG/ACT nasal spray Place 2 sprays into the nose daily as needed. (Patient not taking: Reported on 03/14/2023)     furosemide (LASIX) 40 MG tablet Take 40 mg by mouth daily.     ipratropium-albuterol (DUONEB) 0.5-2.5 (3) MG/3ML SOLN Take 3 mLs by nebulization every 6 (six) hours as needed (shortness of breath, wheezing.). (Patient not taking: Reported on 10/20/2022) 360 mL 0   losartan (COZAAR) 50 MG tablet Take 1 tablet (50 mg total) by mouth daily. 30 tablet 1   metolazone (ZAROXOLYN) 2.5 MG tablet Take 1 tablet (2.5 mg total) by mouth as needed. 10 tablet 0   montelukast  (SINGULAIR) 10 MG tablet Take 10 mg by mouth at bedtime.     Multiple Vitamins-Minerals (PRESERVISION AREDS) CAPS Take 1 capsule by mouth 2 (two) times daily.     potassium chloride SA (KLOR-CON M) 20 MEQ tablet Take 1 tablet (20 mEq total) by mouth as needed. Take this if you take metolazone 10 tablet 3   spironolactone (ALDACTONE) 25 MG tablet Take 1 tablet (25 mg total) by mouth daily. 30 tablet 5   tamsulosin (FLOMAX) 0.4 MG CAPS capsule  Take 0.8 mg by mouth daily. Take 1 capsule (0.4 mg total) by mouth once daily Take 30 minutes after same meal each day.     TRELEGY ELLIPTA 100-62.5-25 MCG/ACT AEPB Inhale 1 puff into the lungs daily.     No current facility-administered medications for this visit.   Vitals:   05/09/23 1005  BP: 134/63  Pulse: (!) 49  SpO2: 96%  Weight: 204 lb (92.5 kg)  Height: 5\' 7"  (1.702 m)   Wt Readings from Last 3 Encounters:  05/09/23 204 lb (92.5 kg)  03/14/23 201 lb 4 oz (91.3 kg)  03/09/23 211 lb 9.6 oz (96 kg)   Lab Results  Component Value Date   CREATININE 1.00 05/09/2023   CREATININE 1.23 04/24/2023   CREATININE 1.04 04/13/2023   PHYSICAL EXAM:  General:  Well appearing. No resp difficulty HEENT: normal Neck: supple. JVP flat. No lymphadenopathy or thryomegaly appreciated. Cor: PMI normal. Regular rhythm. Bradycardic. No rubs, gallops or murmurs. Lungs: clear Abdomen: soft, nontender, nondistended. No hepatosplenomegaly. No bruits or masses.  Extremities: no cyanosis, clubbing, rash, edema Neuro: alert & orientedx3, cranial nerves grossly intact. Moves all 4 extremities w/o difficulty. Affect pleasant.   ECG: not done   ASSESSMENT & PLAN:  1: NICM with preserved ejection fraction with LVH- - likely HTN/ AF - NYHA I - euvolemic today - weighing daily; reminded to call for an overnight weight gain of > 2 pounds or a weekly weight gain of > 5 pounds - weight up 3 pounds from last visit here 6 weeks ago - echo 09/10/22: EF of 60-65%  along with mild LVH, moderate LAE, moderate RAE and mild MR/AR. - not adding salt and his daughters are cooking meals for him - continue farxiga 10mg  daily - continue losartan 50mg  daily - continue spironolactone 25mg  daily - continue furosemide 40mg  daily - continue metolazone 2.5mg / potassium 20mg  PRN; last used this ~ 2 months ago - BNP 09/08/22 was 544.8 - PharmD reconciled meds w/ patient  2: HTN- - BP 134/63 - saw PCP Larwance Sachs) 05/24 - BMP 04/24/23 reviewed and showed sodium 139, potassium 4.0, creatinine 1.23 and GFR 55 - BMP today  3: Atrial fibrillation- - continue apixaban 5mg  BID - continue aspirin 81mg  daily  4: COPD- - saw pulmonology Meredeth Ide) 07/28/22 - using trelegy & nebulizer   Return in 6 months, sooner if needed.

## 2023-05-09 NOTE — Progress Notes (Signed)
[facility] HEART FAILURE CLINIC - Pharmacist Note  David Hardin is a 89 y.o. male with HFpEF (EF >50%) presenting to the Heart Failure Clinic for follow up. He is here with his daughter today. She helps manage his medications at home, though the patient is also knowledgeable of his medications. They report no issues on current regimen. He reports no signs or symptoms of volume overload and adherence to fluid restriction.  Recent ED Visit (past 6 months): none  Guideline-Directed Medical Therapy/Evidence Based Medicine ACE/ARB/ARNI: Losartan 50 mg daily Beta Blocker:  none Aldosterone Antagonist: Spironolactone 25 mg daily Diuretic: Furosemide 40 mg daily  + metolazone 2.5 mg PRN SGLT2i: Dapagliflozin 10 mg daily  Adherence Assessment Do you ever forget to take your medication? [] Yes [x] No  Do you ever skip doses due to side effects? [] Yes [x] No  Do you have trouble affording your medicines? [] Yes [x] No  Are you ever unable to pick up your medication due to transportation difficulties? [] Yes [x] No  Do you ever stop taking your medications because you don't believe they are helping? [] Yes [x] No  Do you check your weight daily? [x] Yes [] No  Adherence strategy: blister packs Barriers to obtaining medications: none reported  Diagnostics ECHO: Date 09/10/2022, EF 60-65%, RWMA, mild LVH  Vitals    03/14/2023    9:53 AM 03/09/2023   11:25 AM 01/31/2023    8:09 AM  Vitals with BMI  Weight 201 lbs 4 oz 211 lbs 10 oz   Systolic 143 172 161  Diastolic 83 58 75  Pulse 56 50 53     Recent Labs    Latest Ref Rng & Units 04/24/2023    9:56 AM 04/13/2023   10:46 AM 03/14/2023   10:55 AM  BMP  Glucose 70 - 99 mg/dL 95  096  045   BUN 8 - 23 mg/dL 33  28  27   Creatinine 0.61 - 1.24 mg/dL 4.09  8.11  9.14   Sodium 135 - 145 mmol/L 139  141  140   Potassium 3.5 - 5.1 mmol/L 4.0  3.4  3.3   Chloride 98 - 111 mmol/L 108  107  102   CO2 22 - 32 mmol/L 24  25  29    Calcium 8.9 - 10.3 mg/dL 8.8   8.9  9.5     Past Medical History Past Medical History:  Diagnosis Date   (HFpEF) heart failure with preserved ejection fraction (HCC) 05/03/2012   a.) TTE 05/03/2012: EF 50-55%, apical AK, mild AR; b.) TTE 05/11/2022: EF >55%, MAC, mod BAE, mod RVE, triv AR/PR, mild MR/TR; c.) TTE 09/10/2022: EF 60-65%, mild LVH, mod BAE, mild MR, AoV sclerosis without stenosis   Aortic atherosclerosis (HCC)    Atrial fibrillation (HCC)    a.) CHA2DS2VASc = 5 (age x2, HFpEF, HTN, NSTEMI);  b.) rate/rhythm maintained without pharmacological intervention; chronically anticoagulated with apixaban   BPH (benign prostatic hyperplasia)    Bradycardia    Cardiomegaly    COPD (chronic obstructive pulmonary disease) (HCC)    Coronary artery disease 05/03/2012   a.) LHC 05/03/2012:  EF 56%, apical dyskinesis, 99% mLAD, 70% D1-1, 75% D1-2, 40% mLCx, 50% pRCA, 99% dRCA, 99% RDPA --> consult CVTS; b.) 2v MIDCAB at [facility] (LIMA-LAD, SVG-PDA)   DDD (degenerative disc disease), lumbar    Diverticulosis    DOE (dyspnea on exertion)    Elevated PSA    Erectile dysfunction    a.) on PDE5i (sildenafil) PRN   GI bleed 09/29/2019  History of kidney stones    History of tobacco abuse    a.) quit 2015   Hyperlipidemia    Hypertension    Long term current use of anticoagulant    a.) apixaban   NSTEMI (non-ST elevated myocardial infarction) (HCC) 05/02/2012   a.) NSTEMI 05/02/2012 --> LHC 05/03/2012 at [facility] --> EF 56%, apical dyskinesis, 99% mLAD, 70% D1-1, 75% D1-2, 40% mLCx, 50% pRCA, 99% dRCA, 99% RDPA --> consult CVTS; b.) 2v MIDCAB at [facility] (LIMA-LAD, SVG-PDA)   Osteoarthritis    Peripheral edema    Pneumonia    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

## 2023-07-20 ENCOUNTER — Other Ambulatory Visit: Payer: Self-pay | Admitting: Family

## 2023-08-23 ENCOUNTER — Ambulatory Visit: Payer: Medicare HMO | Admitting: Dermatology

## 2023-08-27 ENCOUNTER — Encounter: Payer: Self-pay | Admitting: Family

## 2023-11-07 ENCOUNTER — Telehealth: Payer: Self-pay | Admitting: Family

## 2023-11-07 NOTE — Telephone Encounter (Signed)
Pt confirmed for appt 11/12/23

## 2023-11-12 ENCOUNTER — Encounter: Payer: Self-pay | Admitting: Family

## 2023-11-12 ENCOUNTER — Ambulatory Visit: Payer: Medicare HMO | Attending: Family | Admitting: Family

## 2023-11-12 VITALS — BP 170/40 | HR 69 | Resp 18 | Wt 209.1 lb

## 2023-11-12 DIAGNOSIS — D75839 Thrombocytosis, unspecified: Secondary | ICD-10-CM | POA: Diagnosis not present

## 2023-11-12 DIAGNOSIS — Z79899 Other long term (current) drug therapy: Secondary | ICD-10-CM | POA: Diagnosis not present

## 2023-11-12 DIAGNOSIS — Z8249 Family history of ischemic heart disease and other diseases of the circulatory system: Secondary | ICD-10-CM | POA: Insufficient documentation

## 2023-11-12 DIAGNOSIS — I4891 Unspecified atrial fibrillation: Secondary | ICD-10-CM | POA: Diagnosis not present

## 2023-11-12 DIAGNOSIS — I110 Hypertensive heart disease with heart failure: Secondary | ICD-10-CM | POA: Diagnosis present

## 2023-11-12 DIAGNOSIS — I5032 Chronic diastolic (congestive) heart failure: Secondary | ICD-10-CM | POA: Diagnosis not present

## 2023-11-12 DIAGNOSIS — E785 Hyperlipidemia, unspecified: Secondary | ICD-10-CM | POA: Diagnosis not present

## 2023-11-12 DIAGNOSIS — I480 Paroxysmal atrial fibrillation: Secondary | ICD-10-CM

## 2023-11-12 DIAGNOSIS — Z7982 Long term (current) use of aspirin: Secondary | ICD-10-CM | POA: Insufficient documentation

## 2023-11-12 DIAGNOSIS — I10 Essential (primary) hypertension: Secondary | ICD-10-CM | POA: Diagnosis not present

## 2023-11-12 DIAGNOSIS — Z951 Presence of aortocoronary bypass graft: Secondary | ICD-10-CM | POA: Insufficient documentation

## 2023-11-12 DIAGNOSIS — I2510 Atherosclerotic heart disease of native coronary artery without angina pectoris: Secondary | ICD-10-CM | POA: Diagnosis not present

## 2023-11-12 DIAGNOSIS — J449 Chronic obstructive pulmonary disease, unspecified: Secondary | ICD-10-CM

## 2023-11-12 DIAGNOSIS — Z87891 Personal history of nicotine dependence: Secondary | ICD-10-CM | POA: Insufficient documentation

## 2023-11-12 DIAGNOSIS — I428 Other cardiomyopathies: Secondary | ICD-10-CM | POA: Diagnosis not present

## 2023-11-12 DIAGNOSIS — Z7901 Long term (current) use of anticoagulants: Secondary | ICD-10-CM | POA: Diagnosis not present

## 2023-11-12 NOTE — Progress Notes (Signed)
REDS VEST READING= 37% CHEST RULER=32 HEIGHT MARKER=D

## 2023-11-12 NOTE — Patient Instructions (Signed)
Take the metolazone and potassium today and again, tomorrow if you need it.

## 2023-11-12 NOTE — Progress Notes (Signed)
PCP: Kandyce Rud, MD (last seen 08/24) Primary Cardiologist: Marcina Millard, MD (last seen 08/24)  HPI:  David Hardin is a 89 y/o male with a history of CAD with CABG X 2 with LIMA to LAD, SVG to PDA 05/03/2012, hyperlipidemia, HTN, atrial fibrillation, COPD, thrombocytosis (2020), previous tobacco use (stopped 2015) and chronic heart failure. Had total right hip arthroplasty 09/06/23.  Was in the ED 09/13/22 due to urinary retention after foley removed the day prior. Foley catheter replaced and symptoms improved and he was released.   Echo 09/10/22: EF of 60-65% along with mild LVH, moderate LAE, moderate RAE and mild David/AR.   Stress test 07/25/23: EF 55% No regional wall motion maladies  Focal apical scar without significant ischemia   He presents today for a HF follow-up visit with a chief complaint of minimal shortness of breath with moderate exertion. Has noticed this for the last few days. Has associated abdominal bloating/ breast enlargement along with this. Says that anytime he feels swollen over his breasts, it's due to fluid retention. Denies fatigue, chest pain, cough, palpitations, pedal edema, dizziness or difficulty sleeping. Has noticed that his weight has been slowly climbing over these last several days as well.   Has metolazone for PRN use but he hasn't taken any. Has not taken any of his medications yet today but will do so upon his return home.   Says that he's having upcoming lab work in 2 days. Sees cardiology in ~ 2 weeks.   ROS: All systems negative except as listed in HPI, PMH and Problem List.  SH:  Social History   Socioeconomic History   Marital status: Married    Spouse name: [name]   Number of children: Not on file   Years of education: Not on file   Highest education level: Not on file  Occupational History   Occupation: retired  Tobacco Use   Smoking status: Former    Current packs/day: 0.00    Types: Cigarettes    Quit date: 2015    Years  since quitting: 9.9   Smokeless tobacco: Never  Vaping Use   Vaping status: Never Used  Substance and Sexual Activity   Alcohol use: Yes    Alcohol/week: 5.0 standard drinks of alcohol    Types: 5 Shots of liquor per week   Drug use: No   Sexual activity: Never  Other Topics Concern   Not on file  Social History Narrative   Not on file   Social Determinants of Health   Financial Resource Strain: Low Risk  (09/07/2023)   Received from [facility]   Overall Financial Resource Strain (CARDIA)    Difficulty of Paying Living Expenses: Not hard at all  Food Insecurity: No Food Insecurity (09/07/2023)   Received from [facility]   Hunger Vital Sign    Worried About Running Out of Food in the Last Year: Never true    Ran Out of Food in the Last Year: Never true  Transportation Needs: No Transportation Needs (09/07/2023)   Received from [facility] - Transportation    In the past 12 months, has lack of transportation kept you from medical appointments or from getting medications?: No    Lack of Transportation (Non-Medical): No  Physical Activity: Inactive (07/26/2023)   Received from [facility]   Exercise Vital Sign    Days of Exercise per Week: 0 days    Minutes of Exercise per  Session: 0 min  Stress: Not on file  Social Connections: Socially Integrated (07/26/2023)   Received from [facility]   Social Connection and Isolation Panel [NHANES]    Frequency of Communication with Friends and Family: More than three times a week    Frequency of Social Gatherings with Friends and Family: Three times a week    Attends Religious Services: More than 4 times per year    Active Member of Clubs or Organizations: Yes    Attends Banker Meetings: Never    Marital Status: Married  Catering manager Violence: Not At Risk (09/09/2022)   Humiliation, Afraid, Rape, and Kick questionnaire     Fear of Current or Ex-Partner: No    Emotionally Abused: No    Physically Abused: No    Sexually Abused: No    FH:  Family History  Problem Relation Age of Onset   Breast cancer Mother    Heart attack Father     Past Medical History:  Diagnosis Date   (HFpEF) heart failure with preserved ejection fraction (HCC) 05/03/2012   a.) TTE 05/03/2012: EF 50-55%, apical AK, mild AR; b.) TTE 05/11/2022: EF >55%, MAC, mod BAE, mod RVE, triv AR/PR, mild David/TR; c.) TTE 09/10/2022: EF 60-65%, mild LVH, mod BAE, mild David, AoV sclerosis without stenosis   Aortic atherosclerosis (HCC)    Atrial fibrillation (HCC)    a.) CHA2DS2VASc = 5 (age x2, HFpEF, HTN, NSTEMI);  b.) rate/rhythm maintained without pharmacological intervention; chronically anticoagulated with apixaban   BPH (benign prostatic hyperplasia)    Bradycardia    Cardiomegaly    COPD (chronic obstructive pulmonary disease) (HCC)    Coronary artery disease 05/03/2012   a.) LHC 05/03/2012:  EF 56%, apical dyskinesis, 99% mLAD, 70% D1-1, 75% D1-2, 40% mLCx, 50% pRCA, 99% dRCA, 99% RDPA --> consult CVTS; b.) 2v MIDCAB at [facility] (LIMA-LAD, SVG-PDA)   DDD (degenerative disc disease), lumbar    Diverticulosis    DOE (dyspnea on exertion)    Elevated PSA    Erectile dysfunction    a.) on PDE5i (sildenafil) PRN   GI bleed 09/29/2019   History of kidney stones    History of tobacco abuse    a.) quit 2015   Hyperlipidemia    Hypertension    Long term current use of anticoagulant    a.) apixaban   NSTEMI (non-ST elevated myocardial infarction) (HCC) 05/02/2012   a.) NSTEMI 05/02/2012 --> LHC 05/03/2012 at [facility] --> EF 56%, apical dyskinesis, 99% mLAD, 70% D1-1, 75% D1-2, 40% mLCx, 50% pRCA, 99% dRCA, 99% RDPA --> consult CVTS; b.) 2v MIDCAB at [facility] (LIMA-LAD, SVG-PDA)   Osteoarthritis    Peripheral edema    Pneumonia    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     apixaban (ELIQUIS) 5 MG TABS tablet Take 5 mg by mouth every 12 (twelve) hours.  aspirin 81 MG chewable tablet Chew 81 mg by mouth daily.     atorvastatin (LIPITOR) 10 MG tablet Take 10 mg by mouth daily.     dapagliflozin propanediol (FARXIGA) 10 MG TABS tablet Take 1 tablet (10 mg total) by mouth daily. 30 tablet 1   docusate sodium (COLACE) 100 MG capsule Take 2 capsules (200 mg total) by mouth 2 (two) times daily. 120 capsule 3   dutasteride (AVODART) 0.5 MG capsule Take 0.5 mg by mouth daily.     fluticasone (FLONASE) 50 MCG/ACT nasal spray Place 2 sprays into the nose daily as needed.     furosemide (LASIX) 40 MG tablet Take 40 mg by mouth daily.     ipratropium-albuterol (DUONEB) 0.5-2.5 (3) MG/3ML SOLN Take 3 mLs by nebulization every 6 (six) hours as needed (shortness of breath, wheezing.). 360 mL 0   losartan (COZAAR) 50 MG tablet Take 1 tablet (50 mg total) by mouth daily. 30 tablet 1   metolazone (ZAROXOLYN) 2.5 MG tablet Take 1 tablet (2.5 mg total) by mouth as needed. 10 tablet 0   montelukast (SINGULAIR) 10 MG tablet Take 10 mg by mouth at bedtime.     Multiple Vitamins-Minerals (PRESERVISION AREDS) CAPS Take 1 capsule by mouth 2 (two) times daily.     potassium chloride SA (KLOR-CON M) 20 MEQ tablet Take 1 tablet (20 mEq total) by mouth as needed. Take this if you take metolazone 10 tablet 3   spironolactone (ALDACTONE) 25 MG tablet TAKE 1 TABLET BY MOUTH DAILY. 30 tablet 5   tamsulosin (FLOMAX) 0.4 MG CAPS capsule Take 0.8 mg  by mouth daily. Take 1 capsule (0.4 mg total) by mouth once daily Take 30 minutes after same meal each day.     TRELEGY ELLIPTA 100-62.5-25 MCG/ACT AEPB Inhale 1 puff into the lungs daily.     No current facility-administered medications for this visit.   Vitals:   11/12/23 0926  BP: (!) 170/40  Pulse: 69  Resp: 18  SpO2: 97%  Weight: 209 lb 2 oz (94.9 kg)   Wt Readings from Last 3 Encounters:  11/12/23 209 lb 2 oz (94.9 kg)  05/09/23 204 lb (92.5 kg)  03/14/23 201 lb 4 oz (91.3 kg)   Lab Results  Component Value Date   CREATININE 1.00 05/09/2023   CREATININE 1.23 04/24/2023   CREATININE 1.04 04/13/2023   PHYSICAL EXAM:  General:  Well appearing. No resp difficulty HEENT: normal Neck: supple. JVP flat. No lymphadenopathy or thryomegaly appreciated. Cor: PMI normal. Regular rhythm & rate. No rubs, gallops or murmurs. Lungs: clear Abdomen: soft, nontender, nondistended. No hepatosplenomegaly. No bruits or masses.  Extremities: no cyanosis, clubbing, rash, trace pitting edema bilateral lower legs Neuro: alert & orientedx3, cranial nerves grossly intact. Moves all 4 extremities w/o difficulty. Affect pleasant.   ECG: not done  ReDs: 37%   ASSESSMENT & PLAN:  1: NICM with preserved ejection fraction- - likely HTN/ AF - NYHA II - minimally fluid overloaded today with elevated ReDs, weight gain and worsening symptoms - weighing daily; reminded to call for an overnight weight gain of > 2 pounds or a weekly weight gain of > 5 pounds - weight up 5 pounds from last visit here 6 months ago - ReDs 37% - echo 09/10/22: EF of 60-65% along with mild LVH, moderate LAE, moderate RAE and mild David/AR. - not adding salt and his daughters are cooking meals for him - continue farxiga 10mg  daily - continue furosemide 40mg  daily -  continue losartan 50mg  daily - continue spironolactone 25mg  daily - continue metolazone 2.5mg / potassium 20mg  PRN; instructed to use this today and, if needed,  again tomorrow - reports having lab work already scheduled for 2 days from now - BNP 09/08/22 was 544.8  2: HTN- - BP 170/40 (hasn't taken any of his medications yet today but will upon his return home) - saw PCP Larwance Sachs) 08/24 - BMP 09/07/23 reviewed and showed sodium 138, potassium 4.3, creatinine 1 and GFR 70  3: Atrial fibrillation- - continue apixaban 5mg  BID - continue aspirin 81mg  daily - saw cardiology (Paraschos) 08/24; returns in 2 weeks  4: COPD- - saw pulmonology Meredeth Ide) 11/24 - using trelegy & nebulizer  5: Thrombocytosis- - had telemedicine visit with hematology Edgar Frisk) 10/24 - CBC 09/07/23 showed WBC 18.6, Hg 11.4, platelets 867 - denies any bleeding  Since he has cardiology f/u already scheduled for 2 weeks, will have him return in 6 months, sooner if needed.

## 2023-12-20 ENCOUNTER — Telehealth: Payer: Self-pay | Admitting: Family

## 2023-12-20 ENCOUNTER — Other Ambulatory Visit: Payer: Self-pay

## 2023-12-20 MED ORDER — POTASSIUM CHLORIDE CRYS ER 20 MEQ PO TBCR: 20.0000 meq | EXTENDED_RELEASE_TABLET | ORAL | 5 refills | Status: DC | PRN

## 2023-12-20 NOTE — Telephone Encounter (Signed)
 POTASSIUM CHLORIDE REFILL SENT IN.

## 2023-12-27 ENCOUNTER — Other Ambulatory Visit: Payer: Self-pay | Admitting: Family

## 2024-01-01 ENCOUNTER — Ambulatory Visit: Payer: Medicare HMO | Admitting: Dermatology

## 2024-01-01 DIAGNOSIS — L821 Other seborrheic keratosis: Secondary | ICD-10-CM | POA: Diagnosis not present

## 2024-01-01 DIAGNOSIS — L570 Actinic keratosis: Secondary | ICD-10-CM

## 2024-01-01 DIAGNOSIS — L578 Other skin changes due to chronic exposure to nonionizing radiation: Secondary | ICD-10-CM

## 2024-01-01 DIAGNOSIS — D692 Other nonthrombocytopenic purpura: Secondary | ICD-10-CM | POA: Diagnosis not present

## 2024-01-01 DIAGNOSIS — Z872 Personal history of diseases of the skin and subcutaneous tissue: Secondary | ICD-10-CM

## 2024-01-01 DIAGNOSIS — L820 Inflamed seborrheic keratosis: Secondary | ICD-10-CM | POA: Diagnosis not present

## 2024-01-01 DIAGNOSIS — W908XXA Exposure to other nonionizing radiation, initial encounter: Secondary | ICD-10-CM | POA: Diagnosis not present

## 2024-01-01 NOTE — Progress Notes (Unsigned)
Follow-Up Visit   Subjective  ADARIUS ORDERS is a 89 y.o. male who presents for the following: 6 month ak follow up, hx of aks at face  Patient would like arms checked.     The patient has spots, moles and lesions to be evaluated, some may be new or changing and the patient may have concern these could be cancer.   The following portions of the chart were reviewed this encounter and updated as appropriate: medications, allergies, medical history  Review of Systems:  No other skin or systemic complaints except as noted in HPI or Assessment and Plan.  Objective  Well appearing patient in no apparent distress; mood and affect are within normal limits.   A focused examination was performed of the following areas: Arms, hands, face  Relevant exam findings are noted in the Assessment and Plan.  right forearm x 2, right temple x 1 (3) Erythematous stuck-on, waxy papule or plaque right forehead x1 Erythematous thin papules/macules with gritty scale.   Assessment & Plan   Purpura - Chronic; persistent and recurrent.  Treatable, but not curable. - Violaceous macules and patches - Benign - Related to trauma, age, sun damage and/or use of blood thinners, chronic use of topical and/or oral steroids - Observe - Can use OTC arnica containing moisturizer such as Dermend Bruise Formula if desired - Call for worsening or other concerns  SEBORRHEIC KERATOSIS - Stuck-on, waxy, tan-brown papules and/or plaques  - Benign-appearing - Discussed benign etiology and prognosis. - Observe - Call for any changes  ACTINIC DAMAGE - chronic, secondary to cumulative UV radiation exposure/sun exposure over time - diffuse scaly erythematous macules with underlying dyspigmentation - Recommend daily broad spectrum sunscreen SPF 30+ to sun-exposed areas, reapply every 2 hours as needed.  - Recommend staying in the shade or wearing long sleeves, sun glasses (UVA+UVB protection) and wide brim hats  (4-inch brim around the entire circumference of the hat). - Call for new or changing lesions.   INFLAMED SEBORRHEIC KERATOSIS (3) right forearm x 2, right temple x 1 (3) Symptomatic, irritating, patient would like treated. Destruction of lesion - right forearm x 2, right temple x 1 (3) Complexity: simple   Destruction method: cryotherapy   Informed consent: discussed and consent obtained   Timeout:  patient name, date of birth, surgical site, and procedure verified Lesion destroyed using liquid nitrogen: Yes   Region frozen until ice ball extended beyond lesion: Yes   Outcome: patient tolerated procedure well with no complications   Post-procedure details: wound care instructions given   ACTINIC KERATOSIS right forehead x1 Actinic keratoses are precancerous spots that appear secondary to cumulative UV radiation exposure/sun exposure over time. They are chronic with expected duration over 1 year. A portion of actinic keratoses will progress to squamous cell carcinoma of the skin. It is not possible to reliably predict which spots will progress to skin cancer and so treatment is recommended to prevent development of skin cancer.  Recommend daily broad spectrum sunscreen SPF 30+ to sun-exposed areas, reapply every 2 hours as needed.  Recommend staying in the shade or wearing long sleeves, sun glasses (UVA+UVB protection) and wide brim hats (4-inch brim around the entire circumference of the hat). Call for new or changing lesions. Destruction of lesion - right forehead x1 Complexity: simple   Destruction method: cryotherapy   Informed consent: discussed and consent obtained   Timeout:  patient name, date of birth, surgical site, and procedure verified Lesion destroyed using liquid  nitrogen: Yes   Region frozen until ice ball extended beyond lesion: Yes   Outcome: patient tolerated procedure well with no complications   Post-procedure details: wound care instructions given    Return in  about 1 year (around 12/31/2024) for tbse .  IAsher Muir, CMA, am acting as scribe for David Sans, MD.   Documentation: I have reviewed the above documentation for accuracy and completeness, and I agree with the above.  David Sans, MD

## 2024-01-01 NOTE — Patient Instructions (Addendum)
Seborrheic Keratosis  What causes seborrheic keratoses? Seborrheic keratoses are harmless, common skin growths that first appear during adult life.  As time goes by, more growths appear.  Some people may develop a large number of them.  Seborrheic keratoses appear on both covered and uncovered body parts.  They are not caused by sunlight.  The tendency to develop seborrheic keratoses can be inherited.  They vary in color from skin-colored to gray, brown, or even black.  They can be either smooth or have a rough, warty surface.   Seborrheic keratoses are superficial and look as if they were stuck on the skin.  Under the microscope this type of keratosis looks like layers upon layers of skin.  That is why at times the top layer may seem to fall off, but the rest of the growth remains and re-grows.    Treatment Seborrheic keratoses do not need to be treated, but can easily be removed in the office.  Seborrheic keratoses often cause symptoms when they rub on clothing or jewelry.  Lesions can be in the way of shaving.  If they become inflamed, they can cause itching, soreness, or burning.  Removal of a seborrheic keratosis can be accomplished by freezing, burning, or surgery. If any spot bleeds, scabs, or grows rapidly, please return to have it checked, as these can be an indication of a skin cancer.  Cryotherapy Aftercare  Wash gently with soap and water everyday.   Apply Vaseline and Band-Aid daily until healed.   Actinic keratoses are precancerous spots that appear secondary to cumulative UV radiation exposure/sun exposure over time. They are chronic with expected duration over 1 year. A portion of actinic keratoses will progress to squamous cell carcinoma of the skin. It is not possible to reliably predict which spots will progress to skin cancer and so treatment is recommended to prevent development of skin cancer.  Recommend daily broad spectrum sunscreen SPF 30+ to sun-exposed areas, reapply every  2 hours as needed.  Recommend staying in the shade or wearing long sleeves, sun glasses (UVA+UVB protection) and wide brim hats (4-inch brim around the entire circumference of the hat). Call for new or changing lesions.      Due to recent changes in healthcare laws, you may see results of your pathology and/or laboratory studies on MyChart before the doctors have had a chance to review them. We understand that in some cases there may be results that are confusing or concerning to you. Please understand that not all results are received at the same time and often the doctors may need to interpret multiple results in order to provide you with the best plan of care or course of treatment. Therefore, we ask that you please give [place] 2 business days to thoroughly review all your results before contacting the office for clarification. Should we see a critical lab result, you will be contacted sooner.   If You Need Anything After Your Visit  If you have any questions or concerns for your doctor, please call our main line at [phone] and press option 4 to reach your doctor's medical assistant. If no one answers, please leave a voicemail as directed and we will return your call as soon as possible. Messages left after 4 pm will be answered the following business day.   You may also send [place] a message via MyChart. We typically respond to MyChart messages within 1-2 business days.  For prescription refills, please ask your pharmacy to contact our office. Our  fax number is [phone].  If you have an urgent issue when the clinic is closed that cannot wait until the next business day, you can page your doctor at the number below.    Please note that while we do our best to be available for urgent issues outside of office hours, we are not available 24/7.   If you have an urgent issue and are unable to reach [place], you may choose to seek medical care at your doctor's office, retail clinic, urgent care  center, or emergency room.  If you have a medical emergency, please immediately call 911 or go to the emergency department.  Pager Numbers  - Dr. Gwen Pounds: [phone]  - Dr. Roseanne Reno: [phone]  - Dr. Katrinka Blazing: [phone]   In the event of inclement weather, please call our main line at [phone] for an update on the status of any delays or closures.  Dermatology Medication Tips: Please keep the boxes that topical medications come in in order to help keep track of the instructions about where and how to use these. Pharmacies typically print the medication instructions only on the boxes and not directly on the medication tubes.   If your medication is too expensive, please contact our office at [phone] option 4 or send [place] a message through MyChart.   We are unable to tell what your co-pay for medications will be in advance as this is different depending on your insurance coverage. However, we may be able to find a substitute medication at lower cost or fill out paperwork to get insurance to cover a needed medication.   If a prior authorization is required to get your medication covered by your insurance company, please allow [place] 1-2 business days to complete this process.  Drug prices often vary depending on where the prescription is filled and some pharmacies may offer cheaper prices.  The website www.goodrx.com contains coupons for medications through different pharmacies. The prices here do not account for what the cost may be with help from insurance (it may be cheaper with your insurance), but the website can give you the price if you did not use any insurance.  - You can print the associated coupon and take it with your prescription to the pharmacy.  - You may also stop by our office during regular business hours and pick up a GoodRx coupon card.  - If you need your prescription sent electronically to a different pharmacy, notify our office through [facility] or by  phone at [phone] option 4.     Si Usted Necesita Algo Despus de Su Visita  Tambin puede enviarnos un mensaje a travs de Clinical cytogeneticist. Por lo general respondemos a los mensajes de MyChart en el transcurso de 1 a 2 das hbiles.  Para renovar recetas, por favor pida a su farmacia que se ponga en contacto con nuestra oficina. Annie Sable de fax es [place] [phone].  Si tiene un asunto urgente cuando la clnica est cerrada y que no puede esperar hasta el siguiente da hbil, puede llamar/localizar a su doctor(a) al nmero que aparece a continuacin.   Por favor, tenga en cuenta que aunque hacemos todo lo posible para estar disponibles para asuntos urgentes fuera del horario de [place], no estamos disponibles las 24 horas del da, los 7 [address]  [address] de la [place].   Si tiene un problema urgente y no puede comunicarse con nosotros, puede optar por buscar atencin mdica  en el consultorio de su doctor(a), en una clnica privada, en un  centro de atencin urgente o en una sala de emergencias.  Si tiene Engineer, drilling, por favor llame inmediatamente al 911 o vaya a la sala de emergencias.  Nmeros de bper  - Dr. Gwen Pounds: [phone]  - Dra. Roseanne Reno: [phone]  - Dr. Katrinka Blazing: [phone]   En caso de inclemencias del tiempo, por favor llame a Lacy Duverney principal al [phone] para una actualizacin sobre el [place] de cualquier retraso o cierre.  Consejos para la medicacin en dermatologa: Por favor, guarde las cajas en las que vienen los medicamentos de uso tpico para ayudarle a seguir las instrucciones sobre dnde y cmo usarlos. Las farmacias generalmente imprimen las instrucciones del medicamento slo en las cajas y no directamente en los tubos del [place].   Si su medicamento es muy caro, por favor, pngase en contacto con Rolm Gala llamando al [phone] y presione la opcin 4 o envenos un mensaje a travs de Clinical cytogeneticist.   No podemos decirle cul ser su copago  por los medicamentos por adelantado ya que esto es diferente dependiendo de la cobertura de su seguro. Sin embargo, es posible que podamos encontrar un medicamento sustituto a Audiological scientist un formulario para que el seguro cubra el medicamento que se considera necesario.   Si se requiere una autorizacin previa para que su compaa de seguros [place] su medicamento, por favor permtanos de 1 a 2 das hbiles para completar [address]  Los precios de los medicamentos varan con frecuencia dependiendo del Environmental consultant de dnde se surte la receta y alguna farmacias pueden ofrecer precios ms baratos.  El sitio web www.goodrx.com tiene cupones para medicamentos de Health and safety inspector. Los precios aqu no tienen en cuenta lo que podra costar con la ayuda del seguro (puede ser ms barato con su seguro), pero el sitio web puede darle el precio si no utiliz Tourist information centre manager.  - Puede imprimir el cupn correspondiente y llevarlo con su receta a la farmacia.  - Tambin puede pasar por nuestra oficina durante el horario de atencin regular y Education officer, museum una tarjeta de cupones de GoodRx.  - Si necesita que su receta se enve electrnicamente a una farmacia diferente, informe a nuestra oficina a travs de MyChart de [place] o por telfono llamando al [phone] y presione la opcin 4.

## 2024-01-21 ENCOUNTER — Encounter: Payer: Self-pay | Admitting: Dermatology

## 2024-01-31 ENCOUNTER — Encounter: Payer: Self-pay | Admitting: Family

## 2024-01-31 ENCOUNTER — Encounter: Payer: Medicare HMO | Admitting: Family

## 2024-01-31 ENCOUNTER — Ambulatory Visit: Payer: Medicare HMO | Attending: Family | Admitting: Family

## 2024-01-31 VITALS — BP 130/50 | HR 60 | Wt 208.0 lb

## 2024-01-31 DIAGNOSIS — I110 Hypertensive heart disease with heart failure: Secondary | ICD-10-CM | POA: Insufficient documentation

## 2024-01-31 DIAGNOSIS — D75839 Thrombocytosis, unspecified: Secondary | ICD-10-CM | POA: Diagnosis not present

## 2024-01-31 DIAGNOSIS — Z7982 Long term (current) use of aspirin: Secondary | ICD-10-CM | POA: Insufficient documentation

## 2024-01-31 DIAGNOSIS — I5032 Chronic diastolic (congestive) heart failure: Secondary | ICD-10-CM | POA: Diagnosis not present

## 2024-01-31 DIAGNOSIS — I428 Other cardiomyopathies: Secondary | ICD-10-CM | POA: Insufficient documentation

## 2024-01-31 DIAGNOSIS — Z951 Presence of aortocoronary bypass graft: Secondary | ICD-10-CM | POA: Diagnosis not present

## 2024-01-31 DIAGNOSIS — Z8774 Personal history of (corrected) congenital malformations of heart and circulatory system: Secondary | ICD-10-CM | POA: Diagnosis not present

## 2024-01-31 DIAGNOSIS — Z96641 Presence of right artificial hip joint: Secondary | ICD-10-CM | POA: Insufficient documentation

## 2024-01-31 DIAGNOSIS — J449 Chronic obstructive pulmonary disease, unspecified: Secondary | ICD-10-CM | POA: Insufficient documentation

## 2024-01-31 DIAGNOSIS — Z79899 Other long term (current) drug therapy: Secondary | ICD-10-CM | POA: Insufficient documentation

## 2024-01-31 DIAGNOSIS — I2510 Atherosclerotic heart disease of native coronary artery without angina pectoris: Secondary | ICD-10-CM | POA: Insufficient documentation

## 2024-01-31 DIAGNOSIS — I480 Paroxysmal atrial fibrillation: Secondary | ICD-10-CM

## 2024-01-31 DIAGNOSIS — Z7984 Long term (current) use of oral hypoglycemic drugs: Secondary | ICD-10-CM | POA: Insufficient documentation

## 2024-01-31 DIAGNOSIS — E785 Hyperlipidemia, unspecified: Secondary | ICD-10-CM | POA: Insufficient documentation

## 2024-01-31 DIAGNOSIS — R0602 Shortness of breath: Secondary | ICD-10-CM | POA: Diagnosis present

## 2024-01-31 DIAGNOSIS — Z87891 Personal history of nicotine dependence: Secondary | ICD-10-CM | POA: Diagnosis not present

## 2024-01-31 DIAGNOSIS — I5033 Acute on chronic diastolic (congestive) heart failure: Secondary | ICD-10-CM

## 2024-01-31 DIAGNOSIS — Z7901 Long term (current) use of anticoagulants: Secondary | ICD-10-CM | POA: Diagnosis not present

## 2024-01-31 DIAGNOSIS — I10 Essential (primary) hypertension: Secondary | ICD-10-CM

## 2024-01-31 NOTE — Patient Instructions (Signed)
Take metolazone tomorrow (Friday) & Saturday. Wait 1/2 hour after taking it and then take your other medications.   No more broccoli and cheese soup.

## 2024-01-31 NOTE — Progress Notes (Signed)
Advanced Heart Failure Clinic Note    PCP: Kandyce Rud, MD (last seen 02/25; returns 08/25) Primary Cardiologist: Marcina Millard, MD (last seen 12/24; returns 04/25)  Chief Complaint: shortness of breath  HPI:  [name] is a 89 y/o male with a history of CAD with CABG X 2 with LIMA to LAD, SVG to PDA 05/03/2012, hyperlipidemia, HTN, atrial fibrillation, COPD, thrombocytosis (2020), previous tobacco use (stopped 2015) and chronic heart failure. Had total right hip arthroplasty 09/06/23.  Was in the ED 09/13/22 due to urinary retention after foley removed the day prior. Foley catheter replaced and symptoms improved and he was released.   Echo 09/10/22: EF of 60-65% along with mild LVH, moderate LAE, moderate RAE and mild MR/AR.   Stress test 07/25/23: EF 55% No regional wall motion maladies  Focal apical scar without significant ischemia   He presents today, with his daughter, for an acute HF visit with a chief complaint of shortness of breath with exertion. Has worsened over the last few days after eating broccoli/ cheese soup 2 days ago. Has noticed chills, diarrhea, fatigue, chest heaviness when taking deep breaths and a decreased appetite along with this. Denies chest pain, palpitations, abdominal distention, pedal edema, dizziness or weight gain.   Has metolazone for PRN use but he hasn't taken any. Taking furosemide 20mg  BID  Drank OJ 6 oz, diet soda 12 oz, water 12 oz , 4 oz water, 1/2 cup coffee yesterday. He's not entirely certain of how many ounces the cup he drinks out of actually holds.   ROS: All systems negative except as listed in HPI, PMH and Problem List.  SH:  Social History   Socioeconomic History   Marital status: Married    Spouse name: [name]   Number of children: Not on file   Years of education: Not on file   Highest education level: Not on file  Occupational History   Occupation: retired  Tobacco Use   Smoking status: Former    Current  packs/day: 0.00    Types: Cigarettes    Quit date: 2015    Years since quitting: 10.1   Smokeless tobacco: Never  Vaping Use   Vaping status: Never Used  Substance and Sexual Activity   Alcohol use: Yes    Alcohol/week: 5.0 standard drinks of alcohol    Types: 5 Shots of liquor per week   Drug use: No   Sexual activity: Never  Other Topics Concern   Not on file  Social History Narrative   Not on file   Social Drivers of Health   Financial Resource Strain: Low Risk  (09/07/2023)   Received from [facility]   Overall Financial Resource Strain (CARDIA)    Difficulty of Paying Living Expenses: Not hard at all  Food Insecurity: No Food Insecurity (09/07/2023)   Received from [facility]   Hunger Vital Sign    Worried About Running Out of Food in the Last Year: Never true    Ran Out of Food in the Last Year: Never true  Transportation Needs: No Transportation Needs (09/07/2023)   Received from [facility] - Transportation    In the past 12 months, has lack of transportation kept you from medical appointments or from getting medications?: No    Lack of Transportation (Non-Medical): No  Physical Activity: Inactive (07/26/2023)   Received from [facility]   Exercise Vital Sign    Days of Exercise per  Week: 0 days    Minutes of Exercise per Session: 0 min  Stress: Not on file  Social Connections: Socially Integrated (07/26/2023)   Received from [facility]   Social Connection and Isolation Panel [NHANES]    Frequency of Communication with Friends and Family: More than three times a week    Frequency of Social Gatherings with Friends and Family: Three times a week    Attends Religious Services: More than 4 times per year    Active Member of Clubs or Organizations: Yes    Attends Banker Meetings: Never    Marital Status: Married  Catering manager Violence: Not At Risk  (09/09/2022)   Humiliation, Afraid, Rape, and Kick questionnaire    Fear of Current or Ex-Partner: No    Emotionally Abused: No    Physically Abused: No    Sexually Abused: No    FH:  Family History  Problem Relation Age of Onset   Breast cancer Mother    Heart attack Father     Past Medical History:  Diagnosis Date   (HFpEF) heart failure with preserved ejection fraction (HCC) 05/03/2012   a.) TTE 05/03/2012: EF 50-55%, apical AK, mild AR; b.) TTE 05/11/2022: EF >55%, MAC, mod BAE, mod RVE, triv AR/PR, mild MR/TR; c.) TTE 09/10/2022: EF 60-65%, mild LVH, mod BAE, mild MR, AoV sclerosis without stenosis   Aortic atherosclerosis (HCC)    Atrial fibrillation (HCC)    a.) CHA2DS2VASc = 5 (age x2, HFpEF, HTN, NSTEMI);  b.) rate/rhythm maintained without pharmacological intervention; chronically anticoagulated with apixaban   BPH (benign prostatic hyperplasia)    Bradycardia    Cardiomegaly    COPD (chronic obstructive pulmonary disease) (HCC)    Coronary artery disease 05/03/2012   a.) LHC 05/03/2012:  EF 56%, apical dyskinesis, 99% mLAD, 70% D1-1, 75% D1-2, 40% mLCx, 50% pRCA, 99% dRCA, 99% RDPA --> consult CVTS; b.) 2v MIDCAB at [facility] (LIMA-LAD, SVG-PDA)   DDD (degenerative disc disease), lumbar    Diverticulosis    DOE (dyspnea on exertion)    Elevated PSA    Erectile dysfunction    a.) on PDE5i (sildenafil) PRN   GI bleed 09/29/2019   History of kidney stones    History of tobacco abuse    a.) quit 2015   Hyperlipidemia    Hypertension    Long term current use of anticoagulant    a.) apixaban   NSTEMI (non-ST elevated myocardial infarction) (HCC) 05/02/2012   a.) NSTEMI 05/02/2012 --> LHC 05/03/2012 at [facility] --> EF 56%, apical dyskinesis, 99% mLAD, 70% D1-1, 75% D1-2, 40% mLCx, 50% pRCA, 99% dRCA, 99% RDPA --> consult CVTS; b.) 2v MIDCAB at [facility] (LIMA-LAD, SVG-PDA)   Osteoarthritis    Peripheral edema    Pneumonia    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     apixaban (ELIQUIS) 5 MG TABS tablet Take  5 mg by mouth every 12 (twelve) hours.     aspirin 81 MG chewable tablet Chew 81 mg by mouth daily.     atorvastatin (LIPITOR) 10 MG tablet Take 10 mg by mouth daily.     dapagliflozin propanediol (FARXIGA) 10 MG TABS tablet Take 1 tablet (10 mg total) by mouth daily. 30 tablet 1   docusate sodium (COLACE) 100 MG capsule Take 2 capsules (200 mg total) by mouth 2 (two) times daily. 120 capsule 3   dutasteride (AVODART) 0.5 MG capsule Take 0.5 mg by mouth daily.     fluticasone (FLONASE) 50 MCG/ACT nasal spray Place 2 sprays into the nose daily as needed.     furosemide (LASIX) 40 MG tablet Take 40 mg by mouth daily.     ipratropium-albuterol (DUONEB) 0.5-2.5 (3) MG/3ML SOLN Take 3 mLs by nebulization every 6 (six) hours as needed (shortness of breath, wheezing.). 360 mL 0   losartan (COZAAR) 50 MG tablet Take 1 tablet (50 mg total) by mouth daily. 30 tablet 1   metolazone (ZAROXOLYN) 2.5 MG tablet Take 1 tablet (2.5 mg total) by mouth as needed. 10 tablet 0   montelukast (SINGULAIR) 10 MG tablet Take 10 mg by mouth at bedtime.     Multiple Vitamins-Minerals (PRESERVISION AREDS) CAPS Take 1 capsule by mouth 2 (two) times daily.     potassium chloride SA (KLOR-CON M) 20 MEQ tablet Take 1 tablet (20 mEq total) by mouth as needed. Take this if you take metolazone 10 tablet 5   spironolactone (ALDACTONE) 25 MG tablet TAKE 1 TABLET BY MOUTH  DAILY. 30 tablet 5   tamsulosin (FLOMAX) 0.4 MG CAPS capsule Take 0.8 mg by mouth daily. Take 1 capsule (0.4 mg total) by mouth once daily Take 30 minutes after same meal each day.     TRELEGY ELLIPTA 100-62.5-25 MCG/ACT AEPB Inhale 1 puff into the lungs daily.     No current facility-administered medications for this visit.   Vitals:   01/31/24 1346  BP: (!) 130/50  Pulse: 60  SpO2: 92%  Weight: 208 lb (94.3 kg)   Wt Readings from Last 3 Encounters:  01/31/24 208 lb (94.3 kg)  11/12/23 209 lb 2 oz (94.9 kg)  05/09/23 204 lb (92.5 kg)   Lab Results  Component Value Date   CREATININE 1.00 05/09/2023   CREATININE 1.23 04/24/2023   CREATININE 1.04 04/13/2023    PHYSICAL EXAM:  General: Well appearing. No resp difficulty HEENT: normal Neck: supple, elevated JVD to bottom of earlobe Cor: Regular rhythm, rate. No rubs, gallops or murmurs Lungs: clear Abdomen: soft, nontender, nondistended. Extremities: no cyanosis, clubbing, rash, 2+ pitting edema in bilateral lower legs Neuro: alert & oriented X 3. Moves all 4 extremities w/o difficulty. Affect pleasant    ECG: not done  ReDs reading: 41 %, abnormal (see plan below)   ASSESSMENT & PLAN:  1: Acute on chronic NICM with preserved ejection fraction- - likely HTN/ AF - NYHA II - moderately fluid up with elevated ReDs and worsening symptoms - weighing daily; reminded to call for an overnight weight gain of > 2 pounds or a weekly weight gain of > 5 pounds - weight stable from last visit here 4 months ago - ReDs today is 41% - take metolazone 2.5mg / potassium X 2 days - echo 09/10/22: EF of 60-65% along with mild LVH, moderate LAE, moderate RAE and mild MR/AR. - not adding salt and his daughters are cooking meals for him - continue  farxiga 10mg  daily - continue furosemide 40mg  daily - continue losartan 50mg  daily - continue spironolactone 25mg  daily - continue metolazone 2.5mg / potassium PRN (taking for the  next 2 days) - BMET next visit - reviewed knowing how many ounces his cup holds and to keep daily fluid intake to 60-64 oz - reviewed high sodium content of broccoli/ cheese soup - BNP 09/08/22 was 544.8  2: HTN- - BP 130/50 - saw PCP Larwance Sachs) 02/25 - BMP 01/18/24 reviewed and showed sodium 143, potassium 4.6, creatinine 1.1 and GFR 62 - BMET next week  3: Atrial fibrillation- - continue apixaban 5mg  BID - continue aspirin 81mg  daily - saw cardiology (Paraschos) 12/24 - rate controlled today  4: COPD- - saw pulmonology Meredeth Ide) 11/24 - using trelegy & nebulizer  5: Thrombocytosis- - saw hematology Edgar Frisk) 12/24 - CBC 01/18/24 reviewed and showed WBC 10.7, Hg 14.4 - denies any bleeding   Return next week, sooner if needed.   Delma Freeze, FNP 01/31/24

## 2024-02-06 ENCOUNTER — Telehealth: Payer: Self-pay | Admitting: Family

## 2024-02-06 NOTE — Progress Notes (Unsigned)
 Advanced Heart Failure Clinic Note    PCP: Kandyce Rud, MD (last seen 02/25; returns 08/25) Primary Cardiologist: Marcina Millard, MD (last seen 12/24; returns 04/25)  Chief Complaint: fatigue   HPI:  David Hardin is a 89 y/o male with a history of CAD with CABG X 2 with LIMA to LAD, SVG to PDA 05/03/2012, hyperlipidemia, HTN, atrial fibrillation, COPD, thrombocytosis (2020), previous tobacco use (stopped 2015) and chronic heart failure. Had total right hip arthroplasty 09/06/23.  Was in the ED 09/13/22 due to urinary retention after foley removed the day prior. Foley catheter replaced and symptoms improved and he was released.   Echo 09/10/22: EF of 60-65% along with mild LVH, moderate LAE, moderate RAE and mild David/AR.   Stress test 07/25/23: EF 55% No regional wall motion maladies  Focal apical scar without significant ischemia   He presents today, with his daughter, for a HF follow-up visit with a chief complaint of minimal fatigue. Denies any shortness of breath, cough, chest pain/ heaviness, pedal edema or weight gain. Did see his PCP after last visit here and was treated with zpack and prednisone. Finishes his prednisone tomorrow. Since last here, he has taken 2 doses of metolazone/ potassium. Overall, he says that he feels "much better"  Leaving tomorrow to go to [place] to the casino as he's done for ~ 30 years. Does not add additional salt to his food but will be eating out.   ROS: All systems negative except as listed in HPI, PMH and Problem List.  SH:  Social History   Socioeconomic History   Marital status: Married    Spouse name: David Hardin   Number of children: Not on file   Years of education: Not on file   Highest education level: Not on file  Occupational History   Occupation: retired  Tobacco Use   Smoking status: Former    Current packs/day: 0.00    Types: Cigarettes    Quit date: 2015    Years since quitting: 10.1   Smokeless tobacco: Never  Vaping Use    Vaping status: Never Used  Substance and Sexual Activity   Alcohol use: Yes    Alcohol/week: 5.0 standard drinks of alcohol    Types: 5 Shots of liquor per week   Drug use: No   Sexual activity: Never  Other Topics Concern   Not on file  Social History Narrative   Not on file   Social Drivers of Health   Financial Resource Strain: Low Risk  (09/07/2023)   Received from [facility]   Overall Financial Resource Strain (CARDIA)    Difficulty of Paying Living Expenses: Not hard at all  Food Insecurity: No Food Insecurity (09/07/2023)   Received from [facility]   Hunger Vital Sign    Worried About Running Out of Food in the Last Year: Never true    Ran Out of Food in the Last Year: Never true  Transportation Needs: No Transportation Needs (09/07/2023)   Received from [facility] - Transportation    In the past 12 months, has lack of transportation kept you from medical appointments or from getting medications?: No    Lack of Transportation (Non-Medical): No  Physical Activity: Inactive (07/26/2023)   Received from [facility]   Exercise Vital Sign    Days of Exercise per Week: 0 days    Minutes of Exercise per Session: 0 min  Stress: Not on file  Social Connections: Socially Integrated (07/26/2023)   Received from [facility]   Social Connection and Isolation Panel [NHANES]    Frequency of Communication with Friends and Family: More than three times a week    Frequency of Social Gatherings with Friends and Family: Three times a week    Attends Religious Services: More than 4 times per year    Active Member of Clubs or Organizations: Yes    Attends Banker Meetings: Never    Marital Status: Married  Catering manager Violence: Not At Risk (09/09/2022)   Humiliation, Afraid, Rape, and Kick questionnaire    Fear of Current or Ex-Partner: No    Emotionally Abused: No     Physically Abused: No    Sexually Abused: No    FH:  Family History  Problem Relation Age of Onset   Breast cancer Mother    Heart attack Father     Past Medical History:  Diagnosis Date   (HFpEF) heart failure with preserved ejection fraction (HCC) 05/03/2012   a.) TTE 05/03/2012: EF 50-55%, apical AK, mild AR; b.) TTE 05/11/2022: EF >55%, MAC, mod BAE, mod RVE, triv AR/PR, mild David/TR; c.) TTE 09/10/2022: EF 60-65%, mild LVH, mod BAE, mild David, AoV sclerosis without stenosis   Aortic atherosclerosis (HCC)    Atrial fibrillation (HCC)    a.) CHA2DS2VASc = 5 (age x2, HFpEF, HTN, NSTEMI);  b.) rate/rhythm maintained without pharmacological intervention; chronically anticoagulated with apixaban   BPH (benign prostatic hyperplasia)    Bradycardia    Cardiomegaly    COPD (chronic obstructive pulmonary disease) (HCC)    Coronary artery disease 05/03/2012   a.) LHC 05/03/2012:  EF 56%, apical dyskinesis, 99% mLAD, 70% D1-1, 75% D1-2, 40% mLCx, 50% pRCA, 99% dRCA, 99% RDPA --> consult CVTS; b.) 2v MIDCAB at [facility] (LIMA-LAD, SVG-PDA)   DDD (degenerative disc disease), lumbar    Diverticulosis    DOE (dyspnea on exertion)    Elevated PSA    Erectile dysfunction    a.) on PDE5i (sildenafil) PRN   GI bleed 09/29/2019   History of kidney stones    History of tobacco abuse    a.) quit 2015   Hyperlipidemia    Hypertension    Long term current use of anticoagulant    a.) apixaban   NSTEMI (non-ST elevated myocardial infarction) (HCC) 05/02/2012   a.) NSTEMI 05/02/2012 --> [facility]/03/2012 at [facility] --> EF 56%, apical dyskinesis, 99% mLAD, 70% D1-1, 75% D1-2, 40% mLCx, 50% pRCA, 99% dRCA, 99% RDPA --> consult CVTS; b.) 2v MIDCAB at [facility] (LIMA-LAD, SVG-PDA)   Osteoarthritis    Peripheral edema    Pneumonia    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     apixaban (ELIQUIS) 5 MG TABS tablet Take 5 mg by mouth every 12 (twelve) hours.     aspirin 81 MG chewable tablet Chew 81  mg by mouth daily.     atorvastatin (LIPITOR) 10 MG tablet Take 10 mg by mouth daily.     dapagliflozin propanediol (FARXIGA) 10 MG TABS tablet Take 1 tablet (10 mg total) by mouth daily. 30 tablet 1   docusate sodium (COLACE) 100 MG capsule Take 2 capsules (200 mg total) by mouth 2 (two) times daily. 120 capsule 3   dutasteride (AVODART) 0.5 MG capsule Take 0.5 mg by mouth daily.     fluticasone (FLONASE) 50 MCG/ACT nasal spray Place 2 sprays into the nose daily as needed.     furosemide (LASIX) 40 MG tablet Take 20 mg by mouth 2 (two) times daily.     ipratropium-albuterol (DUONEB) 0.5-2.5 (3) MG/3ML SOLN Take 3 mLs by nebulization every 6 (six) hours as needed (shortness of breath, wheezing.). 360 mL 0   losartan (COZAAR) 50 MG tablet Take 1 tablet (50 mg total) by mouth daily. 30 tablet 1   metolazone (ZAROXOLYN) 2.5 MG tablet Take 1 tablet (2.5 mg total) by mouth as needed. 10 tablet 0   montelukast (SINGULAIR) 10 MG tablet Take 10 mg by mouth at bedtime.     Multiple Vitamins-Minerals (PRESERVISION AREDS) CAPS Take 1 capsule by mouth 2 (two) times daily.     potassium chloride SA (KLOR-CON M) 20 MEQ tablet Take 1 tablet (20 mEq total) by mouth as needed. Take this if you take metolazone 10 tablet 5   spironolactone (ALDACTONE) 25 MG tablet TAKE 1 TABLET BY MOUTH DAILY. 30 tablet 5   tamsulosin (FLOMAX) 0.4 MG CAPS capsule Take 0.8 mg by mouth daily. Take 1 capsule (0.4 mg total) by  mouth once daily Take 30 minutes after same meal each day.     TRELEGY ELLIPTA 100-62.5-25 MCG/ACT AEPB Inhale 1 puff into the lungs daily.     No current facility-administered medications for this visit.   Vitals:   02/07/24 0829  BP: (!) 160/53  Pulse: 65  SpO2: 95%  Weight: 204 lb 12.8 oz (92.9 kg)   Wt Readings from Last 3 Encounters:  02/07/24 204 lb 12.8 oz (92.9 kg)  01/31/24 208 lb (94.3 kg)  11/12/23 209 lb 2 oz (94.9 kg)   Lab Results  Component Value Date   CREATININE 1.00 05/09/2023   CREATININE 1.23 04/24/2023   CREATININE 1.04 04/13/2023    PHYSICAL EXAM:  General: Well appearing. No resp difficulty HEENT: normal Neck: supple, no JVD Cor: Regular rhythm, rate. No rubs, gallops or murmurs Lungs: clear Abdomen: soft, nontender, nondistended. Extremities: no cyanosis, clubbing, rash, edema Neuro: alert & oriented X 3. Moves all 4 extremities w/o difficulty. Affect pleasant   ECG: not done  ReDs reading: 39 %, abnormal but improving   ASSESSMENT & PLAN:  1: Chronic NICM with preserved ejection fraction- - likely HTN/ AF - NYHA II - euvolemic - weighing daily; reminded to call for an overnight weight gain of > 2 pounds or a weekly weight gain of > 5 pounds - weight down 4 pounds from last visit here 1 week ago - ReDs today is 39%; last week it was 41% - took metolazone 2.5mg / potassium X 2 days since last here - echo 09/10/22: EF of 60-65% along with mild LVH, moderate LAE, moderate RAE and mild David/AR. - not adding salt and his daughters are cooking meals for him - reviewed how to make better sodium choices when eating out while he's out of town - continue farxiga 10mg  daily - continue furosemide 20mg  BID -  continue losartan 50mg  daily - continue spironolactone 25mg  daily - continue metolazone 2.5mg / potassium PRN; he can take this while out of town if needed - BMET today - BNP 09/08/22 was 544.8  2: HTN- - BP 160/53; most likely this  could be related to current prednisone use - BP last visit was 130/50 - saw PCP Larwance Sachs) 02/25 - BMP 01/18/24 reviewed and showed sodium 143, potassium 4.6, creatinine 1.1 and GFR 62 - BMET today  3: Atrial fibrillation- - continue apixaban 5mg  BID - continue aspirin 81mg  daily - saw cardiology (Paraschos) 12/24 - rate controlled today  4: COPD- - saw pulmonology Meredeth Ide) 11/24 - using trelegy & nebulizer  5: Thrombocytosis- - saw hematology Edgar Frisk) 12/24 - CBC 01/18/24 reviewed and showed WBC 10.7, Hg 14.4 - denies any bleeding   Return in 2 weeks, sooner if needed.   Delma Freeze, FNP 02/06/24

## 2024-02-06 NOTE — Telephone Encounter (Signed)
 Pt confirmed appt for 02/07/24

## 2024-02-07 ENCOUNTER — Encounter: Payer: Self-pay | Admitting: Family

## 2024-02-07 ENCOUNTER — Ambulatory Visit: Payer: Medicare HMO | Attending: Family | Admitting: Family

## 2024-02-07 VITALS — BP 160/53 | HR 65 | Wt 204.8 lb

## 2024-02-07 DIAGNOSIS — Z7901 Long term (current) use of anticoagulants: Secondary | ICD-10-CM | POA: Diagnosis not present

## 2024-02-07 DIAGNOSIS — E785 Hyperlipidemia, unspecified: Secondary | ICD-10-CM | POA: Diagnosis not present

## 2024-02-07 DIAGNOSIS — Z79899 Other long term (current) drug therapy: Secondary | ICD-10-CM | POA: Diagnosis not present

## 2024-02-07 DIAGNOSIS — I428 Other cardiomyopathies: Secondary | ICD-10-CM | POA: Diagnosis not present

## 2024-02-07 DIAGNOSIS — Z8774 Personal history of (corrected) congenital malformations of heart and circulatory system: Secondary | ICD-10-CM | POA: Diagnosis not present

## 2024-02-07 DIAGNOSIS — I110 Hypertensive heart disease with heart failure: Secondary | ICD-10-CM | POA: Insufficient documentation

## 2024-02-07 DIAGNOSIS — I480 Paroxysmal atrial fibrillation: Secondary | ICD-10-CM | POA: Diagnosis not present

## 2024-02-07 DIAGNOSIS — I10 Essential (primary) hypertension: Secondary | ICD-10-CM

## 2024-02-07 DIAGNOSIS — Z951 Presence of aortocoronary bypass graft: Secondary | ICD-10-CM | POA: Diagnosis not present

## 2024-02-07 DIAGNOSIS — Z7982 Long term (current) use of aspirin: Secondary | ICD-10-CM | POA: Diagnosis not present

## 2024-02-07 DIAGNOSIS — Z87891 Personal history of nicotine dependence: Secondary | ICD-10-CM | POA: Insufficient documentation

## 2024-02-07 DIAGNOSIS — I2510 Atherosclerotic heart disease of native coronary artery without angina pectoris: Secondary | ICD-10-CM | POA: Insufficient documentation

## 2024-02-07 DIAGNOSIS — I4891 Unspecified atrial fibrillation: Secondary | ICD-10-CM | POA: Insufficient documentation

## 2024-02-07 DIAGNOSIS — I5032 Chronic diastolic (congestive) heart failure: Secondary | ICD-10-CM | POA: Diagnosis not present

## 2024-02-07 DIAGNOSIS — D75839 Thrombocytosis, unspecified: Secondary | ICD-10-CM | POA: Diagnosis not present

## 2024-02-07 DIAGNOSIS — J449 Chronic obstructive pulmonary disease, unspecified: Secondary | ICD-10-CM | POA: Insufficient documentation

## 2024-02-07 DIAGNOSIS — Z96641 Presence of right artificial hip joint: Secondary | ICD-10-CM | POA: Diagnosis not present

## 2024-02-07 NOTE — Progress Notes (Signed)
 ReDS Vest / Clip - 02/07/24 0800       ReDS Vest / Clip   Station Marker C    Ruler Value 30    ReDS Value Range Moderate volume overload    ReDS Actual Value 39

## 2024-02-07 NOTE — Progress Notes (Signed)
 2 bottles of Farxiga given at visit.  Farxiga 10mg  Lot: [id number] Exp: 09/09/2026

## 2024-02-07 NOTE — Patient Instructions (Addendum)
 Medication Changes:  No medication changes today  Lab Work:  Go DOWN to LOWER LEVEL (LL) to have your blood work completed inside of [employer] office.   We will only call you if the results are abnormal or if the provider would like to make medication changes.   Special Instructions // Education:  Do the following things EVERYDAY: Weigh yourself in the morning before breakfast. Write it down and keep it in a log. Take your medicines as prescribed Eat low salt foods--Limit salt (sodium) to 2000 mg per day.  Stay as active as you can everyday Limit all fluids for the day to less than 2 liters   Follow-Up in: Please follow up with the Advanced Heart Failure Clinic in 1-2 weeks with Inetta Fermo.  At the Advanced Heart Failure Clinic, you and your health needs are our priority. We have a designated team specialized in the treatment of Heart Failure. This Care Team includes your primary Heart Failure Specialized Cardiologist (physician), Advanced Practice Providers (APPs- Physician Assistants and Nurse Practitioners), and Pharmacist who all work together to provide you with the care you need, when you need it.   You may see any of the following providers on your designated Care Team at your next follow up:  Dr. Arvilla Meres Dr. Marca Ancona Dr. Dorthula Nettles Dr. Theresia Bough Clarisa Kindred, NP Tonye Becket, NP Robbie Lis, [place] [facility] [place], [place] Brynda Peon, NP [place] Lee, NP Karle Plumber, PharmD   Please be sure to bring in all your medications bottles to every appointment.   Need to Contact us:  If you have any questions or concerns before your next appointment please send [place] a message through [place] or call our office at [phone].    TO LEAVE A MESSAGE FOR THE NURSE SELECT OPTION 2, PLEASE LEAVE A MESSAGE INCLUDING: YOUR NAME DATE OF BIRTH CALL BACK NUMBER REASON FOR CALL**this is important as we prioritize the call backs  YOU WILL RECEIVE A CALL  BACK THE SAME DAY AS LONG AS YOU CALL BEFORE 4:00 PM

## 2024-02-08 ENCOUNTER — Encounter: Payer: Self-pay | Admitting: Family

## 2024-02-08 LAB — BASIC METABOLIC PANEL
BUN/Creatinine Ratio: 29 — ABNORMAL HIGH (ref 10–24)
BUN: 37 mg/dL — ABNORMAL HIGH (ref 10–36)
CO2: 24 mmol/L (ref 20–29)
Calcium: 9.6 mg/dL (ref 8.6–10.2)
Chloride: 102 mmol/L (ref 96–106)
Creatinine, Ser: 1.28 mg/dL — ABNORMAL HIGH (ref 0.76–1.27)
Glucose: 111 mg/dL — ABNORMAL HIGH (ref 70–99)
Potassium: 5.1 mmol/L (ref 3.5–5.2)
Sodium: 140 mmol/L (ref 134–144)
eGFR: 52 mL/min/{1.73_m2} — ABNORMAL LOW (ref >59)

## 2024-02-20 ENCOUNTER — Telehealth: Payer: Self-pay | Admitting: Family

## 2024-02-20 NOTE — Telephone Encounter (Signed)
 Unable to reach pt for appt on 02/21/24

## 2024-02-20 NOTE — Progress Notes (Unsigned)
 Advanced Heart Failure Clinic Note    PCP: Kandyce Rud, MD (last seen 02/25; returns 08/25) Primary Cardiologist: Marcina Millard, MD (last seen 12/24; returns 04/25)  Chief Complaint: fatigue  HPI:  David Hardin is a 89 y/o male with a history of CAD with CABG X 2 with LIMA to LAD, SVG to PDA 05/03/2012, hyperlipidemia, HTN, atrial fibrillation, COPD, thrombocytosis (2020), previous tobacco use (stopped 2015) and chronic heart failure. Had total right hip arthroplasty 09/06/23.  Was in the ED 09/13/22 due to urinary retention after foley removed the day prior. Foley catheter replaced and symptoms improved and he was released.   Echo 09/10/22: EF of 60-65% along with mild LVH, moderate LAE, moderate RAE and mild David/AR.   Stress test 07/25/23: EF 55% No regional wall motion maladies  Focal apical scar without significant ischemia   He presents today for a HF follow-up visit with a chief complaint of fatigue. Says that it's very mild and overall "feels great". Denies any shortness of breath, chest pain, palpitations, abdominal distention, pedal edema or dizziness. Went to the casino in [place] a couple of weeks ago and knows that he ate more salt than usual. Also had kielbasa sausage and cabbage last night. Home weight ranges 201-205 pounds.   Leaving in 4 days with his son to watch [facility] spring training in [place].   ROS: All systems negative except as listed in HPI, PMH and Problem List.  SH:  Social History   Socioeconomic History   Marital status: Married    Spouse name: David Hardin   Number of children: Not on file   Years of education: Not on file   Highest education level: Not on file  Occupational History   Occupation: retired  Tobacco Use   Smoking status: Former    Current packs/day: 0.00    Types: Cigarettes    Quit date: 2015    Years since quitting: 10.2   Smokeless tobacco: Never  Vaping Use   Vaping status: Never Used  Substance and Sexual Activity   Alcohol use:  Yes    Alcohol/week: 5.0 standard drinks of alcohol    Types: 5 Shots of liquor per week   Drug use: No   Sexual activity: Never  Other Topics Concern   Not on file  Social History Narrative   Not on file   Social Drivers of Health   Financial Resource Strain: Low Risk  (09/07/2023)   Received from [facility]   Overall Financial Resource Strain (CARDIA)    Difficulty of Paying Living Expenses: Not hard at all  Food Insecurity: No Food Insecurity (09/07/2023)   Received from [facility]   Hunger Vital Sign    Worried About Running Out of Food in the Last Year: Never true    Ran Out of Food in the Last Year: Never true  Transportation Needs: No Transportation Needs (09/07/2023)   Received from [facility] - Transportation    In the past 12 months, has lack of transportation kept you from medical appointments or from getting medications?: No    Lack of Transportation (Non-Medical): No  Physical Activity: Inactive (07/26/2023)   Received from [facility]   Exercise Vital Sign    Days of Exercise per Week: 0 days    Minutes of Exercise per Session: 0 min  Stress: Not on file  Social Connections: Socially Integrated (07/26/2023)   Received from [facility]  Social Connection and Isolation Panel [NHANES]    Frequency of Communication with Friends and Family: More than three times a week    Frequency of Social Gatherings with Friends and Family: Three times a week    Attends Religious Services: More than 4 times per year    Active Member of Clubs or Organizations: Yes    Attends Banker Meetings: Never    Marital Status: Married  Catering manager Violence: Not At Risk (09/09/2022)   Humiliation, Afraid, Rape, and Kick questionnaire    Fear of Current or Ex-Partner: No    Emotionally Abused: No    Physically Abused: No    Sexually Abused: No    FH:  Family History   Problem Relation Age of Onset   Breast cancer Mother    Heart attack Father     Past Medical History:  Diagnosis Date   (HFpEF) heart failure with preserved ejection fraction (HCC) 05/03/2012   a.) TTE 05/03/2012: EF 50-55%, apical AK, mild AR; b.) TTE 05/11/2022: EF >55%, MAC, mod BAE, mod RVE, triv AR/PR, mild David/TR; c.) TTE 09/10/2022: EF 60-65%, mild LVH, mod BAE, mild David, AoV sclerosis without stenosis   Aortic atherosclerosis (HCC)    Atrial fibrillation (HCC)    a.) CHA2DS2VASc = 5 (age x2, HFpEF, HTN, NSTEMI);  b.) rate/rhythm maintained without pharmacological intervention; chronically anticoagulated with apixaban   BPH (benign prostatic hyperplasia)    Bradycardia    Cardiomegaly    COPD (chronic obstructive pulmonary disease) (HCC)    Coronary artery disease 05/03/2012   a.) LHC 05/03/2012:  EF 56%, apical dyskinesis, 99% mLAD, 70% D1-1, 75% D1-2, 40% mLCx, 50% pRCA, 99% dRCA, 99% RDPA --> consult CVTS; b.) 2v MIDCAB at [facility] (LIMA-LAD, SVG-PDA)   DDD (degenerative disc disease), lumbar    Diverticulosis    DOE (dyspnea on exertion)    Elevated PSA    Erectile dysfunction    a.) on PDE5i (sildenafil) PRN   GI bleed 09/29/2019   History of kidney stones    History of tobacco abuse    a.) quit 2015   Hyperlipidemia    Hypertension    Long term current use of anticoagulant    a.) apixaban   NSTEMI (non-ST elevated myocardial infarction) (HCC) 05/02/2012   a.) NSTEMI 05/02/2012 --> LHC 05/03/2012 at [facility] --> EF 56%, apical dyskinesis, 99% mLAD, 70% D1-1, 75% D1-2, 40% mLCx, 50% pRCA, 99% dRCA, 99% RDPA --> consult CVTS; b.) 2v MIDCAB at [facility] (LIMA-LAD, SVG-PDA)   Osteoarthritis    Peripheral edema    Pneumonia    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     apixaban (ELIQUIS) 5 MG TABS tablet Take 5 mg by mouth every 12 (twelve) hours.     aspirin 81 MG chewable tablet Chew 81 mg by mouth daily.     atorvastatin (LIPITOR) 10 MG tablet Take 10  mg by mouth daily.     dapagliflozin propanediol (FARXIGA) 10 MG TABS tablet Take 1 tablet (10 mg total) by mouth daily. 30 tablet 1   docusate sodium (COLACE) 100 MG capsule Take 2 capsules (200 mg total) by mouth 2 (two) times daily. 120 capsule 3   dutasteride (AVODART) 0.5 MG capsule Take 0.5 mg by mouth daily.     fluticasone (FLONASE) 50 MCG/ACT nasal spray Place 2 sprays into the nose daily as needed.     furosemide (LASIX) 40 MG tablet Take 20 mg by mouth 2 (two) times daily.     hydroxyurea (HYDREA) 500 MG capsule Take 500 mg by mouth daily. May take with food to minimize GI side effects.     ipratropium-albuterol (DUONEB) 0.5-2.5 (3) MG/3ML SOLN Take 3 mLs by nebulization every 6 (six) hours as needed (shortness of breath, wheezing.). 360 mL 0   losartan (COZAAR) 50 MG tablet Take 1 tablet (50 mg total) by mouth daily. 30 tablet 1   metolazone (ZAROXOLYN) 2.5 MG tablet Take 1 tablet (2.5 mg total) by mouth as needed. 10 tablet 0   montelukast (SINGULAIR) 10 MG tablet Take 10 mg by mouth at bedtime.     Multiple Vitamins-Minerals (PRESERVISION AREDS) CAPS Take 1 capsule by mouth 2 (two) times daily.     potassium chloride SA (KLOR-CON M) 20 MEQ tablet Take 1 tablet (20 mEq total) by mouth as needed. Take this if you take metolazone 10 tablet 5   spironolactone (ALDACTONE) 25 MG tablet TAKE 1 TABLET BY MOUTH DAILY. 30 tablet 5   tamsulosin (FLOMAX) 0.4 MG CAPS capsule Take 0.8 mg by  mouth daily. Take 1 capsule (0.4 mg total) by mouth once daily Take 30 minutes after same meal each day.     TRELEGY ELLIPTA 100-62.5-25 MCG/ACT AEPB Inhale 1 puff into the lungs daily.     No current facility-administered medications for this visit.   Vitals:   02/21/24 0858  BP: (!) 157/57  Pulse: (!) 49  SpO2: 97%  Weight: 207 lb 12.8 oz (94.3 kg)   Wt Readings from Last 3 Encounters:  02/21/24 207 lb 12.8 oz (94.3 kg)  02/07/24 204 lb 12.8 oz (92.9 kg)  01/31/24 208 lb (94.3 kg)   Lab Results  Component Value Date   CREATININE 1.28 (H) 02/07/2024   CREATININE 1.00 05/09/2023   CREATININE 1.23 04/24/2023    PHYSICAL EXAM:  General: Well appearing. No resp difficulty HEENT: normal Neck: supple, no JVD Cor: Regular rhythm, bradycardic. No rubs, gallops or murmurs Lungs: clear Abdomen: soft, nontender, nondistended. Extremities: no cyanosis, clubbing, rash, trace pitting edema bilateral lower legs Neuro: alert & oriented X 3. Moves all 4 extremities w/o difficulty. Affect pleasant   ECG: not done  ReDs reading: 40 %, abnormal (see plan below)   ASSESSMENT & PLAN:  1: Chronic NICM with preserved ejection fraction- - likely due to HTN/ AF - NYHA II - mildly fluid up with elevated ReDs/ weight gain - weighing daily; reminded to call for an overnight weight gain of > 2 pounds or a weekly weight gain of > 5 pounds - weight up 3 pounds from last visit here 2 weeks ago - ReDs 40%; two weeks ago it was 39% - instructed to take metolazone 2.5mg  every other day X 2 doses - echo 09/10/22: EF of 60-65% along with mild LVH, moderate LAE, moderate RAE and mild David/AR. - not adding salt and his daughters are cooking meals for him -  reviewed how to make better sodium choices when eating out while he's out of town; was just in [place] at the casino ~ 1 week ago and headed to [facility] for [facility] spring training in a few days - continue farxiga 10mg  daily - continue furosemide 20mg  BID -  continue losartan 50mg  daily - continue spironolactone 25mg  daily - continue metolazone 2.5mg / potassium PRN - BMET next visit - BNP 09/08/22 was 544.8  2: HTN- - BP 157/57 - taking metolazone per above - saw PCP David Hardin) 02/25 - BMP 02/07/24 reviewed and showed sodium 140, potassium 5.1, creatinine 1.28 and GFR 52 - BMET next visit  3: Atrial fibrillation- - continue apixaban 5mg  BID - continue aspirin 81mg  daily - saw cardiology (David Hardin) 12/24 - bradycardic today  4: COPD- - saw pulmonology David Hardin) 11/24 - using trelegy & nebulizer  5: Thrombocytosis- - saw hematology David Hardin) 12/24 - CBC 01/18/24 reviewed and showed WBC 10.7, Hg 14.4 - PLT count 02/15/24 was 814 - denies any bleeding   Return in 1 month, sooner if needed.   Delma Freeze, FNP 02/20/24

## 2024-02-21 ENCOUNTER — Ambulatory Visit: Payer: Medicare HMO | Attending: Family | Admitting: Family

## 2024-02-21 ENCOUNTER — Encounter: Payer: Self-pay | Admitting: Family

## 2024-02-21 VITALS — BP 157/57 | HR 49 | Wt 207.8 lb

## 2024-02-21 DIAGNOSIS — I4891 Unspecified atrial fibrillation: Secondary | ICD-10-CM | POA: Insufficient documentation

## 2024-02-21 DIAGNOSIS — Z7901 Long term (current) use of anticoagulants: Secondary | ICD-10-CM | POA: Insufficient documentation

## 2024-02-21 DIAGNOSIS — Z7984 Long term (current) use of oral hypoglycemic drugs: Secondary | ICD-10-CM | POA: Insufficient documentation

## 2024-02-21 DIAGNOSIS — D75839 Thrombocytosis, unspecified: Secondary | ICD-10-CM | POA: Insufficient documentation

## 2024-02-21 DIAGNOSIS — Z79899 Other long term (current) drug therapy: Secondary | ICD-10-CM | POA: Insufficient documentation

## 2024-02-21 DIAGNOSIS — I5032 Chronic diastolic (congestive) heart failure: Secondary | ICD-10-CM | POA: Insufficient documentation

## 2024-02-21 DIAGNOSIS — Z96641 Presence of right artificial hip joint: Secondary | ICD-10-CM | POA: Insufficient documentation

## 2024-02-21 DIAGNOSIS — Z7982 Long term (current) use of aspirin: Secondary | ICD-10-CM | POA: Insufficient documentation

## 2024-02-21 DIAGNOSIS — J449 Chronic obstructive pulmonary disease, unspecified: Secondary | ICD-10-CM | POA: Diagnosis not present

## 2024-02-21 DIAGNOSIS — I428 Other cardiomyopathies: Secondary | ICD-10-CM | POA: Diagnosis not present

## 2024-02-21 DIAGNOSIS — I480 Paroxysmal atrial fibrillation: Secondary | ICD-10-CM

## 2024-02-21 DIAGNOSIS — I2510 Atherosclerotic heart disease of native coronary artery without angina pectoris: Secondary | ICD-10-CM | POA: Diagnosis not present

## 2024-02-21 DIAGNOSIS — I110 Hypertensive heart disease with heart failure: Secondary | ICD-10-CM | POA: Diagnosis not present

## 2024-02-21 DIAGNOSIS — Z951 Presence of aortocoronary bypass graft: Secondary | ICD-10-CM | POA: Insufficient documentation

## 2024-02-21 DIAGNOSIS — I10 Essential (primary) hypertension: Secondary | ICD-10-CM

## 2024-02-21 NOTE — Patient Instructions (Signed)
 Medication Changes:  Take Metolazone TODAY and SATURDAY   Follow-Up in: 1 month with Clarisa Kindred, FNP  At the [facility], you and your health needs are our priority. We have a designated team specialized in the treatment of Heart Failure. This Care Team includes your primary Heart Failure Specialized Cardiologist (physician), Advanced Practice Providers (APPs- Physician Assistants and Nurse Practitioners), and Pharmacist who all work together to provide you with the care you need, when you need it.   You may see any of the following providers on your designated Care Team at your next follow up:  Dr. Arvilla Meres Dr. Marca Ancona Dr. Dorthula Nettles Dr. Theresia Bough Clarisa Kindred, FNP Enos Fling, RPH-CPP  Please be sure to bring in all your medications bottles to every appointment.   Need to Contact us:  If you have any questions or concerns before your next appointment please send [place] a message through [place] or call our office at [phone].    TO LEAVE A MESSAGE FOR THE NURSE SELECT OPTION 2, PLEASE LEAVE A MESSAGE INCLUDING: YOUR NAME DATE OF BIRTH CALL BACK NUMBER REASON FOR CALL**this is important as we prioritize the call backs  YOU WILL RECEIVE A CALL BACK THE SAME DAY AS LONG AS YOU CALL BEFORE 4:00 PM

## 2024-02-21 NOTE — Progress Notes (Signed)
 ReDS Vest / Clip - 02/21/24 0900       ReDS Vest / Clip   Station Marker C    Ruler Value 29    ReDS Value Range Moderate volume overload    ReDS Actual Value 40

## 2024-03-26 ENCOUNTER — Telehealth: Payer: Self-pay | Admitting: Family

## 2024-03-26 NOTE — Progress Notes (Unsigned)
 [facility] Note    PCP: David Rud, MD (last seen 02/25; returns 08/25) Primary Cardiologist: David Millard, MD (last seen 12/24; returns 04/25)  Chief Complaint: fatigue  HPI:  [name] is a 89 y/o male with a history of CAD with CABG X 2 with LIMA to LAD, SVG to PDA 05/03/2012, hyperlipidemia, HTN, atrial fibrillation, COPD, thrombocytosis (2020), previous tobacco use (stopped 2015) and chronic heart failure. Had total right hip arthroplasty 09/06/23.  Was in the ED 09/13/22 due to urinary retention after foley removed the day prior. Foley catheter replaced and symptoms improved and he was released.   Echo 09/10/22: EF of 60-65% along with mild LVH, moderate LAE, moderate RAE and mild MR/AR.   Stress test 07/25/23: EF 55% No regional wall motion maladies  Focal apical scar without significant ischemia   He presents today for a HF follow-up visit with a chief complaint of fatigue. Says that it's very mild and overall "feels great". Denies any shortness of breath, chest pain, palpitations, abdominal distention, pedal edema or dizziness. Went to the casino in [place] a couple of weeks ago and knows that he ate more salt than usual. Also had kielbasa sausage and cabbage last night. Home weight ranges 201-205 pounds.   Leaving in 4 days with his son to watch [facility] spring training in [place].   ROS: All systems negative except as listed in HPI, PMH and Problem List.  SH:  Social History   Socioeconomic History   Marital status: Married    Spouse name: David Hardin   Number of children: Not on file   Years of education: Not on file   Highest education level: Not on file  Occupational History   Occupation: retired  Tobacco Use   Smoking status: Former    Current packs/day: 0.00    Types: Cigarettes    Quit date: 2015    Years since quitting: 10.2   Smokeless tobacco: Never  Vaping Use   Vaping status: Never Used  Substance and Sexual Activity   Alcohol use:  Yes    Alcohol/week: 5.0 standard drinks of alcohol    Types: 5 Shots of liquor per week   Drug use: No   Sexual activity: Never  Other Topics Concern   Not on file  Social History Narrative   Not on file   Social Drivers of Health   Financial Resource Strain: Low Risk  (09/07/2023)   Received from [facility]   Overall Financial Resource Strain (CARDIA)    Difficulty of Paying Living Expenses: Not hard at all  Food Insecurity: No Food Insecurity (09/07/2023)   Received from [facility]   Hunger Vital Sign    Worried About Running Out of Food in the Last Year: Never true    Ran Out of Food in the Last Year: Never true  Transportation Needs: No Transportation Needs (09/07/2023)   Received from [facility] - Transportation    In the past 12 months, has lack of transportation kept you from medical appointments or from getting medications?: No    Lack of Transportation (Non-Medical): No  Physical Activity: Inactive (07/26/2023)   Received from [facility]   Exercise Vital Sign    Days of Exercise per Week: 0 days    Minutes of Exercise per Session: 0 min  Stress: Not on file  Social Connections: Socially Integrated (07/26/2023)   Received from [facility]  Social Connection and Isolation Panel [NHANES]    Frequency of Communication with Friends and Family: More than three times a week    Frequency of Social Gatherings with Friends and Family: Three times a week    Attends Religious Services: More than 4 times per year    Active Member of Clubs or Organizations: Yes    Attends Banker Meetings: Never    Marital Status: Married  Catering manager Violence: Not At Risk (09/09/2022)   Humiliation, Afraid, Rape, and Kick questionnaire    Fear of Current or Ex-Partner: No    Emotionally Abused: No    Physically Abused: No    Sexually Abused: No    FH:  Family History   Problem Relation Age of Onset   Breast cancer Mother    Heart attack Father     Past Medical History:  Diagnosis Date   (HFpEF) heart failure with preserved ejection fraction (HCC) 05/03/2012   a.) TTE 05/03/2012: EF 50-55%, apical AK, mild AR; b.) TTE 05/11/2022: EF >55%, MAC, mod BAE, mod RVE, triv AR/PR, mild MR/TR; c.) TTE 09/10/2022: EF 60-65%, mild LVH, mod BAE, mild MR, AoV sclerosis without stenosis   Aortic atherosclerosis (HCC)    Atrial fibrillation (HCC)    a.) CHA2DS2VASc = 5 (age x2, HFpEF, HTN, NSTEMI);  b.) rate/rhythm maintained without pharmacological intervention; chronically anticoagulated with apixaban   BPH (benign prostatic hyperplasia)    Bradycardia    Cardiomegaly    COPD (chronic obstructive pulmonary disease) (HCC)    Coronary artery disease 05/03/2012   a.) LHC 05/03/2012:  EF 56%, apical dyskinesis, 99% mLAD, 70% D1-1, 75% D1-2, 40% mLCx, 50% pRCA, 99% dRCA, 99% RDPA --> consult CVTS; b.) 2v MIDCAB at [facility] (LIMA-LAD, SVG-PDA)   DDD (degenerative disc disease), lumbar    Diverticulosis    DOE (dyspnea on exertion)    Elevated PSA    Erectile dysfunction    a.) on PDE5i (sildenafil) PRN   GI bleed 09/29/2019   History of kidney stones    History of tobacco abuse    a.) quit 2015   Hyperlipidemia    Hypertension    Long term current use of anticoagulant    a.) apixaban   NSTEMI (non-ST elevated myocardial infarction) (HCC) 05/02/2012   a.) NSTEMI 05/02/2012 --> LHC 05/03/2012 at [facility] --> EF 56%, apical dyskinesis, 99% mLAD, 70% D1-1, 75% D1-2, 40% mLCx, 50% pRCA, 99% dRCA, 99% RDPA --> consult CVTS; b.) 2v MIDCAB at [facility] (LIMA-LAD, SVG-PDA)   Osteoarthritis    Peripheral edema    Pneumonia    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     apixaban (ELIQUIS) 5 MG TABS tablet Take 5 mg by mouth every 12 (twelve) hours.     aspirin 81 MG chewable tablet Chew 81 mg by mouth daily.     atorvastatin (LIPITOR) 10 MG tablet Take 10  mg by mouth daily.     dapagliflozin propanediol (FARXIGA) 10 MG TABS tablet Take 1 tablet (10 mg total) by mouth daily. 30 tablet 1   docusate sodium (COLACE) 100 MG capsule Take 2 capsules (200 mg total) by mouth 2 (two) times daily. 120 capsule 3   dutasteride (AVODART) 0.5 MG capsule Take 0.5 mg by mouth daily.     fluticasone (FLONASE) 50 MCG/ACT nasal spray Place 2 sprays into the nose daily as needed.     furosemide (LASIX) 40 MG tablet Take 20 mg by mouth 2 (two) times daily.     hydroxyurea (HYDREA) 500 MG capsule Take 500 mg by mouth daily. May take with food to minimize GI side effects.     ipratropium-albuterol (DUONEB) 0.5-2.5 (3) MG/3ML SOLN Take 3 mLs by nebulization every 6 (six) hours as needed (shortness of breath, wheezing.). 360 mL 0   losartan (COZAAR) 50 MG tablet Take 1 tablet (50 mg total) by mouth daily. 30 tablet 1   metolazone (ZAROXOLYN) 2.5 MG tablet Take 1 tablet (2.5 mg total) by mouth as needed. 10 tablet 0   montelukast (SINGULAIR) 10 MG tablet Take 10 mg by mouth at bedtime.     Multiple Vitamins-Minerals (PRESERVISION AREDS) CAPS Take 1 capsule by mouth 2 (two) times daily.     potassium chloride SA (KLOR-CON M) 20 MEQ tablet Take 1 tablet (20 mEq total) by mouth as needed. Take this if you take metolazone 10 tablet 5   spironolactone (ALDACTONE) 25 MG tablet TAKE 1 TABLET BY MOUTH DAILY. 30 tablet 5   tamsulosin (FLOMAX) 0.4 MG CAPS capsule Take 0.8 mg by  mouth daily. Take 1 capsule (0.4 mg total) by mouth once daily Take 30 minutes after same meal each day.     TRELEGY ELLIPTA 100-62.5-25 MCG/ACT AEPB Inhale 1 puff into the lungs daily.     No current facility-administered medications for this visit.   There were no vitals filed for this visit.  Wt Readings from Last 3 Encounters:  02/21/24 207 lb 12.8 oz (94.3 kg)  02/07/24 204 lb 12.8 oz (92.9 kg)  01/31/24 208 lb (94.3 kg)   Lab Results  Component Value Date   CREATININE 1.28 (H) 02/07/2024   CREATININE 1.00 05/09/2023   CREATININE 1.23 04/24/2023    PHYSICAL EXAM:  General: Well appearing. No resp difficulty HEENT: normal Neck: supple, no JVD Cor: Regular rhythm, bradycardic. No rubs, gallops or murmurs Lungs: clear Abdomen: soft, nontender, nondistended. Extremities: no cyanosis, clubbing, rash, trace pitting edema bilateral lower legs Neuro: alert & oriented X 3. Moves all 4 extremities w/o difficulty. Affect pleasant   ECG: not done  ReDs reading: 40 %, abnormal (see plan below)   ASSESSMENT & PLAN:  1: Chronic NICM with preserved ejection fraction- - likely due to HTN/ AF - NYHA II - mildly fluid up with elevated ReDs/ weight gain - weighing daily; reminded to call for an overnight weight gain of > 2 pounds or a weekly weight gain of > 5 pounds - weight up 3 pounds from last visit here 2 weeks ago - ReDs 40%; two weeks ago it was 39% - instructed to take metolazone 2.5mg  every other day X 2 doses - echo 09/10/22: EF of 60-65% along with mild LVH, moderate LAE, moderate RAE and mild MR/AR. - not adding salt and his daughters are cooking meals for him - reviewed how to make better sodium choices when eating out while he's out of town; was just  in [place] at the casino ~ 1 week ago and headed to [facility] for [facility] spring training in a few days - continue farxiga 10mg  daily - continue furosemide 20mg  BID - continue losartan 50mg  daily - continue spironolactone 25mg  daily -  continue metolazone 2.5mg / potassium 20meq PRN - BMET next visit - BNP 09/08/22 was 544.8  2: HTN- - BP 157/57 - taking metolazone per above - saw PCP David Hardin) 02/25 - BMP 02/07/24 reviewed and showed sodium 140, potassium 5.1, creatinine 1.28 and GFR 52 - BMET next visit  3: Atrial fibrillation- - continue apixaban 5mg  BID - continue aspirin 81mg  daily - saw cardiology (David Hardin) 12/24 - bradycardic today  4: COPD- - saw pulmonology David Hardin) 11/24 - using trelegy & nebulizer  5: Thrombocytosis- - saw hematology David Hardin) 12/24 - CBC 01/18/24 reviewed and showed WBC 10.7, Hg 14.4 - PLT count 02/15/24 was 814 - denies any bleeding   Return in 1 month, sooner if needed.   Charlette Console, FNP 03/26/24

## 2024-03-26 NOTE — Telephone Encounter (Incomplete)
 Called to confirm/remind patient of their appointment at the [facility] on 03/27/24***.   Appointment:   [] Confirmed  [x] Left mess   [] No answer/No voice mail  [] Phone not in service  Patient reminded to bring all medications and/or complete list.  Confirmed patient has transportation. Gave directions, instructed to utilize valet parking.

## 2024-03-27 ENCOUNTER — Ambulatory Visit: Attending: Family | Admitting: Family

## 2024-03-27 ENCOUNTER — Encounter: Payer: Self-pay | Admitting: Family

## 2024-03-27 ENCOUNTER — Other Ambulatory Visit (HOSPITAL_COMMUNITY): Payer: Self-pay

## 2024-03-27 VITALS — BP 142/49 | HR 57 | Wt 206.0 lb

## 2024-03-27 DIAGNOSIS — I428 Other cardiomyopathies: Secondary | ICD-10-CM | POA: Diagnosis not present

## 2024-03-27 DIAGNOSIS — Z87891 Personal history of nicotine dependence: Secondary | ICD-10-CM | POA: Diagnosis not present

## 2024-03-27 DIAGNOSIS — I5032 Chronic diastolic (congestive) heart failure: Secondary | ICD-10-CM | POA: Insufficient documentation

## 2024-03-27 DIAGNOSIS — Z7901 Long term (current) use of anticoagulants: Secondary | ICD-10-CM | POA: Insufficient documentation

## 2024-03-27 DIAGNOSIS — I480 Paroxysmal atrial fibrillation: Secondary | ICD-10-CM | POA: Diagnosis not present

## 2024-03-27 DIAGNOSIS — Z951 Presence of aortocoronary bypass graft: Secondary | ICD-10-CM | POA: Insufficient documentation

## 2024-03-27 DIAGNOSIS — I10 Essential (primary) hypertension: Secondary | ICD-10-CM | POA: Diagnosis not present

## 2024-03-27 DIAGNOSIS — J449 Chronic obstructive pulmonary disease, unspecified: Secondary | ICD-10-CM | POA: Insufficient documentation

## 2024-03-27 DIAGNOSIS — I110 Hypertensive heart disease with heart failure: Secondary | ICD-10-CM | POA: Insufficient documentation

## 2024-03-27 DIAGNOSIS — Z79899 Other long term (current) drug therapy: Secondary | ICD-10-CM | POA: Diagnosis not present

## 2024-03-27 DIAGNOSIS — E785 Hyperlipidemia, unspecified: Secondary | ICD-10-CM | POA: Insufficient documentation

## 2024-03-27 DIAGNOSIS — D75839 Thrombocytosis, unspecified: Secondary | ICD-10-CM | POA: Diagnosis not present

## 2024-03-27 DIAGNOSIS — Z96641 Presence of right artificial hip joint: Secondary | ICD-10-CM | POA: Diagnosis not present

## 2024-03-27 DIAGNOSIS — I2510 Atherosclerotic heart disease of native coronary artery without angina pectoris: Secondary | ICD-10-CM | POA: Diagnosis not present

## 2024-03-27 DIAGNOSIS — I4891 Unspecified atrial fibrillation: Secondary | ICD-10-CM | POA: Diagnosis not present

## 2024-03-27 DIAGNOSIS — R5383 Other fatigue: Secondary | ICD-10-CM | POA: Diagnosis present

## 2024-03-27 MED ORDER — ENTRESTO 24-26 MG PO TABS: 1.0000 | ORAL_TABLET | Freq: Two times a day (BID) | ORAL | 11 refills | Status: AC

## 2024-03-27 NOTE — Progress Notes (Signed)
 ReDS Vest / Clip - 03/27/24 1000       ReDS Vest / Clip   Station Marker C    Ruler Value 29    ReDS Value Range Moderate volume overload    ReDS Actual Value 39

## 2024-03-27 NOTE — Progress Notes (Signed)
 [facility] - HEART FAILURE CLINIC - PHARMACIST COUNSELING NOTE  Adherence Assessment  Do you ever forget to take your medication? [] Yes [x] No  Do you ever skip doses due to side effects? [] Yes [x] No  Do you have trouble affording your medicines? [] Yes [x] No  Are you ever unable to pick up your medication due to transportation difficulties? [] Yes [x] No  Do you ever stop taking your medications because you don't believe they are helping? [] Yes [x] No  Do you check your weight daily? Weight same at home ; 200-203 lbs  [x] Yes [] No  Do you check your blood pressure daily? [] Yes [x] No  Adherence strategy: Meds are blister packed by outpatient pharmacy   Barriers to obtaining medications: None reported     Vital signs: HR 45, BP 142/49, weight 206 lbs. ECHO: Date 09/10/2022, EF 60-65% Renal function: Date 03/07/24, GFR 62  Current Guideline-Directed Medical Therapy/Evidence Based Medicine  ACE/ARB/ARNI: Losartan 50 mg daily Target dose: 150 mg daily  Beta Blocker:  N/A Target dose: N/A  Aldosterone Antagonist: Spironolactone 25 mg daily Target dose: 25 - 50 mg daily   SGLT2i: Dapagliflozin 10 mg daily Target dose: 10 mg daily   Diuretic: Furosemide 20 mg twice daily / KCl 20 mEq & metolazone 2.5 mg daily PRN  ASSESSMENT 89 year old male who presents to the HF clinic for a follow-up appointment. PMH is significant for CAD with CABG X 2 with LIMA to LAD, SVG to PDA 05/03/2012, hyperlipidemia, HTN, atrial fibrillation, COPD, thrombocytosis (2020), previous tobacco use (stopped 2015) and chronic heart failure.   Recent ED visit or hospitalization (past 6 months): None   COUNSELING POINTS/CLINICAL PEARLS  Sacubitril-valsartan (Goal: 97-103 mg twice daily) Advise patient to report symptomatic hypotension.  Side effects may include hyperkalemia, cough, dizziness, or renal failure.   PLAN Continue current regimen as directed by NP Will plan to discontinue  losartan and start Entresto 24/26 mg BID  Due for an annual ECHO   Time spent: 20 minutes  Pansy Bogus, PharmD Pharmacy Resident  03/27/2024 10:51 AM

## 2024-03-27 NOTE — Patient Instructions (Signed)
 Medication Changes:  STOP LOSARTAN  START ENTRESTO 24/26 MG TWICE DAILY   Follow-Up in: 1 MONTH WITH TINA HACKNEY, FNP.  At the [facility], you and your health needs are our priority. We have a designated team specialized in the treatment of Heart Failure. This Care Team includes your primary Heart Failure Specialized Cardiologist (physician), Advanced Practice Providers (APPs- Physician Assistants and Nurse Practitioners), and Pharmacist who all work together to provide you with the care you need, when you need it.   You may see any of the following providers on your designated Care Team at your next follow up:  Dr. Jules Oar Dr. Peder Bourdon Dr. Alwin Baars Dr. Judyth Nunnery Shawnee Dellen, FNP Bevely Brush, RPH-CPP  Please be sure to bring in all your medications bottles to every appointment.   Need to Contact Us :  If you have any questions or concerns before your next appointment please send us  a message through [place] or call our office at [phone].    TO LEAVE A MESSAGE FOR THE NURSE SELECT OPTION 2, PLEASE LEAVE A MESSAGE INCLUDING: YOUR NAME DATE OF BIRTH CALL BACK NUMBER REASON FOR CALL**this is important as we prioritize the call backs  YOU WILL RECEIVE A CALL BACK THE SAME DAY AS LONG AS YOU CALL BEFORE 4:00 PM

## 2024-05-01 ENCOUNTER — Encounter: Admitting: Family

## 2024-05-09 ENCOUNTER — Telehealth: Payer: Self-pay | Admitting: Internal Medicine

## 2024-05-09 NOTE — Telephone Encounter (Signed)
 Called to confirm/remind patient of their appointment at the [facility] on 05/12/24.   Appointment:   [x] Confirmed  [] Left mess   [] No answer/No voice mail  [] VM Full/unable to leave message  [] Phone not in service  Patient reminded to bring all medications and/or complete list.  Confirmed patient has transportation. Gave directions, instructed to utilize valet parking.

## 2024-05-11 NOTE — Progress Notes (Unsigned)
 Advanced Heart Failure Clinic Note    PCP: Nestor Banter, MD (last seen 02/25; returns 08/25) Primary Cardiologist: Percival Brace, MD (last seen 04/25)  Chief Complaint: fatigue  HPI:  David Hardin is a 89 y/o male with a history of CAD with CABG X 2 with LIMA to LAD, SVG to PDA 05/03/2012, hyperlipidemia, HTN, atrial fibrillation, COPD, thrombocytosis (2020), previous tobacco use (stopped 2015) and chronic heart failure. Had total right hip arthroplasty 09/06/23.  Was in the ED 09/13/22 due to urinary retention after foley removed the day prior. Foley catheter replaced and symptoms improved and he was released.   Echo 09/10/22: EF of 60-65% along with mild LVH, moderate LAE, moderate RAE and mild David/AR.   Stress test 07/25/23: EF 55% No regional wall motion maladies  Focal apical scar without significant ischemia   Seen in [facility] 04/25 where losartan  was stopped and entresto  24/26mg  BID was started.   He presents today for a HF follow-up visit with a chief complaint of very minimal fatigue. Denies shortness of breath, chest pain, palpitations, cough, abdominal distention, pedal edema, dizziness or difficulty sleeping.   Has taken 1 dose of metolazone / potassium since last here as he went to a family cookout and "ate foods I shouldn't have ate and drank alcohol". Home weight went up to 207 pounds afterwards, so he took 1 dose of metolazone  and weight came back down. Headed to [place]  late July/ early August to be inducted into the [employer].   ROS: All systems negative except as listed in HPI, PMH and Problem List.  SH:  Social History   Socioeconomic History   Marital status: Married    Spouse name: David Hardin   Number of children: Not on file   Years of education: Not on file   Highest education level: Not on file  Occupational History   Occupation: retired  Tobacco Use   Smoking status: Former    Current packs/day: 0.00    Types: Cigarettes    Quit date: 2015     Years since quitting: 10.4   Smokeless tobacco: Never  Vaping Use   Vaping status: Never Used  Substance and Sexual Activity   Alcohol use: Yes    Alcohol/week: 5.0 standard drinks of alcohol    Types: 5 Shots of liquor per week   Drug use: No   Sexual activity: Never  Other Topics Concern   Not on file  Social History Narrative   Not on file   Social Drivers of Health   Financial Resource Strain: Low Risk  (09/07/2023)   Received from [facility]   Overall Financial Resource Strain (CARDIA)    Difficulty of Paying Living Expenses: Not hard at all  Food Insecurity: No Food Insecurity (09/07/2023)   Received from [facility]   Hunger Vital Sign    Worried About Running Out of Food in the Last Year: Never true    Ran Out of Food in the Last Year: Never true  Transportation Needs: No Transportation Needs (09/07/2023)   Received from [facility] - Transportation    In the past 12 months, has lack of transportation kept you from medical appointments or from getting medications?: No    Lack of Transportation (Non-Medical): No  Physical Activity: Inactive (07/26/2023)   Received from [facility]   Exercise Vital Sign    Days of Exercise per Week: 0 days    Minutes of Exercise  per Session: 0 min  Stress: Not on file  Social Connections: Socially Integrated (07/26/2023)   Received from [facility]   Social Connection and Isolation Panel [NHANES]    Frequency of Communication with Friends and Family: More than three times a week    Frequency of Social Gatherings with Friends and Family: Three times a week    Attends Religious Services: More than 4 times per year    Active Member of Clubs or Organizations: Yes    Attends Banker Meetings: Never    Marital Status: Married  Catering manager Violence: Not At Risk (09/09/2022)   Humiliation, Afraid, Rape, and Kick questionnaire     Fear of Current or Ex-Partner: No    Emotionally Abused: No    Physically Abused: No    Sexually Abused: No    FH:  Family History  Problem Relation Age of Onset   Breast cancer Mother    Heart attack Father     Past Medical History:  Diagnosis Date   (HFpEF) heart failure with preserved ejection fraction (HCC) 05/03/2012   a.) TTE 05/03/2012: EF 50-55%, apical AK, mild AR; b.) TTE 05/11/2022: EF >55%, MAC, mod BAE, mod RVE, triv AR/PR, mild David/TR; c.) TTE 09/10/2022: EF 60-65%, mild LVH, mod BAE, mild David, AoV sclerosis without stenosis   Aortic atherosclerosis (HCC)    Atrial fibrillation (HCC)    a.) CHA2DS2VASc = 5 (age x2, HFpEF, HTN, NSTEMI);  b.) rate/rhythm maintained without pharmacological intervention; chronically anticoagulated with apixaban    BPH (benign prostatic hyperplasia)    Bradycardia    Cardiomegaly    COPD (chronic obstructive pulmonary disease) (HCC)    Coronary artery disease 05/03/2012   a.) LHC 05/03/2012:  EF 56%, apical dyskinesis, 99% mLAD, 70% D1-1, 75% D1-2, 40% mLCx, 50% pRCA, 99% dRCA, 99% RDPA --> consult CVTS; b.) 2v MIDCAB at [facility] (LIMA-LAD, SVG-PDA)   DDD (degenerative disc disease), lumbar    Diverticulosis    DOE (dyspnea on exertion)    Elevated PSA    Erectile dysfunction    a.) on PDE5i (sildenafil) PRN   GI bleed 09/29/2019   History of kidney stones    History of tobacco abuse    a.) quit 2015   Hyperlipidemia    Hypertension    Long term current use of anticoagulant    a.) apixaban    NSTEMI (non-ST elevated myocardial infarction) (HCC) 05/02/2012   a.) NSTEMI 05/02/2012 --> LHC 05/03/2012 at [facility] --> EF 56%, apical dyskinesis, 99% mLAD, 70% D1-1, 75% D1-2, 40% mLCx, 50% pRCA, 99% dRCA, 99% RDPA --> consult CVTS; b.) 2v MIDCAB at [facility] (LIMA-LAD, SVG-PDA)   Osteoarthritis    Peripheral edema    Pneumonia    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     apixaban  (ELIQUIS ) 5 MG TABS tablet Take 5 mg by mouth every 12 (twelve) hours.  aspirin  81 MG chewable tablet Chew 81 mg by mouth daily.     atorvastatin  (LIPITOR) 10 MG tablet Take 10 mg by mouth daily.     dapagliflozin  propanediol (FARXIGA ) 10 MG TABS tablet Take 1 tablet (10 mg total) by mouth daily. 30 tablet 1   docusate sodium  (COLACE) 100 MG capsule Take 2 capsules (200 mg total) by mouth 2 (two) times daily. 120 capsule 3   dutasteride (AVODART) 0.5 MG capsule Take 0.5 mg by mouth daily.     fluticasone  (FLONASE ) 50 MCG/ACT nasal spray Place 2 sprays into the nose daily as needed.     furosemide  (LASIX ) 40 MG tablet Take 20 mg by mouth 2 (two) times daily.     hydroxyurea (HYDREA) 500 MG capsule Take 500 mg by mouth daily. May take with food to minimize GI side effects.     ipratropium-albuterol  (DUONEB) 0.5-2.5 (3) MG/3ML SOLN Take 3 mLs by nebulization every 6 (six) hours as needed (shortness of breath, wheezing.). 360 mL 0   metolazone  (ZAROXOLYN ) 2.5 MG tablet Take 1 tablet (2.5 mg total) by mouth as needed. 10 tablet 0   montelukast  (SINGULAIR ) 10 MG tablet Take 10 mg by mouth at bedtime.     Multiple Vitamins-Minerals (PRESERVISION AREDS) CAPS Take 1 capsule by mouth 2 (two) times daily.     potassium chloride  SA (KLOR-CON  M) 20 MEQ tablet Take 1 tablet (20 mEq total) by mouth as needed. Take this if you take metolazone  10 tablet 5   sacubitril-valsartan (ENTRESTO ) 24-26 MG Take 1 tablet by mouth 2 (two) times daily. 60  tablet 11   spironolactone  (ALDACTONE ) 25 MG tablet TAKE 1 TABLET BY MOUTH DAILY. 30 tablet 5   tamsulosin  (FLOMAX ) 0.4 MG CAPS capsule Take 0.8 mg by mouth daily. Take 1 capsule (0.4 mg total) by mouth once daily Take 30 minutes after same meal each day.     TRELEGY ELLIPTA 100-62.5-25 MCG/ACT AEPB Inhale 1 puff into the lungs daily.     No current facility-administered medications for this visit.   Vitals:   05/12/24 0924  BP: (!) 118/98  Pulse: 89  SpO2: 98%  Weight: 207 lb 12.8 oz (94.3 kg)   Wt Readings from Last 3 Encounters:  05/12/24 207 lb 12.8 oz (94.3 kg)  03/27/24 206 lb (93.4 kg)  02/21/24 207 lb 12.8 oz (94.3 kg)   Lab Results  Component Value Date   CREATININE 1.28 (H) 02/07/2024   CREATININE 1.00 05/09/2023   CREATININE 1.23 04/24/2023    PHYSICAL EXAM:  General: Well appearing. No resp difficulty HEENT: HOH Neck: supple, no JVD Cor: Regular rhythm, rate. No rubs, gallops or murmurs Lungs: clear Abdomen: soft, nontender, nondistended. Extremities: no cyanosis, clubbing, rash, trace pitting edema bilateral shins Neuro: alert & oriented X 3. Moves all 4 extremities w/o difficulty. Affect pleasant   ECG: not done    ASSESSMENT & PLAN:  1: Chronic NICM with preserved ejection fraction- - likely due to HTN/ AF - NYHA II - euvolemic - weighing daily; reminded to call for an overnight weight gain of > 2 pounds or a weekly weight gain of > 5 pounds - weight stable from last visit here 6 weeks ago - echo 09/10/22: EF of 60-65% along with mild LVH, moderate LAE, moderate RAE and mild David/AR. - not adding salt and his daughters are cooking meals for him - continue farxiga  10mg  daily - continue furosemide  20mg  BID - continue entresto  24/26mg  BID  - continue spironolactone  25mg   daily - continue metolazone  2.5mg / potassium 20meq PRN; has taken 1 dose of this after family cookout - CMP today - BNP 09/08/22 was 544.8  2: HTN- - BP 118/98 - saw PCP David Hardin)  02/25 - BMP 04/09/24 reviewed: sodium 144, potassium 4.1, creatinine 1.4 and GFR 47 - CMP today  3: Atrial fibrillation- - continue apixaban  5mg  BID - continue aspirin  81mg  daily - saw cardiology (David Hardin) 04/25  4: COPD- - saw pulmonology David Hardin) 11/24 - using trelegy & nebulizer  5: Thrombocytosis- - saw hematology (David Hardin) 04/25 & bone marrow biopsy was deferred - CBC 04/09/24 reviewed and showed WBC 9.5, Hg 14.8, PLT 562 - denies any bleeding - per hematology request, repeat CBC today and fax results to them   Return in 3 months, sooner if needed.   Charlette Console, FNP 05/11/24

## 2024-05-12 ENCOUNTER — Encounter: Payer: Self-pay | Admitting: Family

## 2024-05-12 ENCOUNTER — Ambulatory Visit: Payer: Medicare HMO | Attending: Family | Admitting: Family

## 2024-05-12 VITALS — BP 118/98 | HR 89 | Wt 207.8 lb

## 2024-05-12 DIAGNOSIS — Z79899 Other long term (current) drug therapy: Secondary | ICD-10-CM | POA: Diagnosis not present

## 2024-05-12 DIAGNOSIS — Z8249 Family history of ischemic heart disease and other diseases of the circulatory system: Secondary | ICD-10-CM | POA: Diagnosis not present

## 2024-05-12 DIAGNOSIS — I2510 Atherosclerotic heart disease of native coronary artery without angina pectoris: Secondary | ICD-10-CM | POA: Diagnosis not present

## 2024-05-12 DIAGNOSIS — I5032 Chronic diastolic (congestive) heart failure: Secondary | ICD-10-CM | POA: Diagnosis present

## 2024-05-12 DIAGNOSIS — I4891 Unspecified atrial fibrillation: Secondary | ICD-10-CM | POA: Diagnosis not present

## 2024-05-12 DIAGNOSIS — E785 Hyperlipidemia, unspecified: Secondary | ICD-10-CM | POA: Insufficient documentation

## 2024-05-12 DIAGNOSIS — I428 Other cardiomyopathies: Secondary | ICD-10-CM | POA: Diagnosis not present

## 2024-05-12 DIAGNOSIS — Z87891 Personal history of nicotine dependence: Secondary | ICD-10-CM | POA: Insufficient documentation

## 2024-05-12 DIAGNOSIS — I480 Paroxysmal atrial fibrillation: Secondary | ICD-10-CM

## 2024-05-12 DIAGNOSIS — Z8774 Personal history of (corrected) congenital malformations of heart and circulatory system: Secondary | ICD-10-CM | POA: Diagnosis not present

## 2024-05-12 DIAGNOSIS — Z7982 Long term (current) use of aspirin: Secondary | ICD-10-CM | POA: Diagnosis not present

## 2024-05-12 DIAGNOSIS — Z7901 Long term (current) use of anticoagulants: Secondary | ICD-10-CM | POA: Insufficient documentation

## 2024-05-12 DIAGNOSIS — I10 Essential (primary) hypertension: Secondary | ICD-10-CM | POA: Diagnosis not present

## 2024-05-12 DIAGNOSIS — Z951 Presence of aortocoronary bypass graft: Secondary | ICD-10-CM | POA: Insufficient documentation

## 2024-05-12 DIAGNOSIS — J449 Chronic obstructive pulmonary disease, unspecified: Secondary | ICD-10-CM | POA: Diagnosis not present

## 2024-05-12 DIAGNOSIS — I110 Hypertensive heart disease with heart failure: Secondary | ICD-10-CM | POA: Insufficient documentation

## 2024-05-12 DIAGNOSIS — Z96641 Presence of right artificial hip joint: Secondary | ICD-10-CM | POA: Insufficient documentation

## 2024-05-12 DIAGNOSIS — D75839 Thrombocytosis, unspecified: Secondary | ICD-10-CM

## 2024-05-12 MED ORDER — POTASSIUM CHLORIDE CRYS ER 20 MEQ PO TBCR: 20.0000 meq | EXTENDED_RELEASE_TABLET | ORAL | 5 refills | Status: AC | PRN

## 2024-05-12 MED ORDER — METOLAZONE 2.5 MG PO TABS: 2.5000 mg | ORAL_TABLET | ORAL | 1 refills | Status: AC | PRN

## 2024-05-12 NOTE — Patient Instructions (Signed)
 Medication Changes:  No medication changes.   Lab Work:  Go DOWN to LOWER LEVEL (LL) to have your blood work completed inside of [employer] office.  We will only call you if the results are abnormal or if the provider would like to make medication changes.   Follow-Up in: 3 months with Shawnee Dellen, FNP.  At the Advanced [facility] Failure Clinic, you and your health needs are our priority. We have a designated team specialized in the treatment of Heart Failure. This Care Team includes your primary Heart Failure Specialized Cardiologist (physician), Advanced Practice Providers (APPs- Physician Assistants and Nurse Practitioners), and Pharmacist who all work together to provide you with the care you need, when you need it.   You may see any of the following providers on your designated Care Team at your next follow up:  Dr. Jules Oar Dr. Peder Bourdon Dr. Alwin Baars Dr. Judyth Nunnery Shawnee Dellen, FNP Bevely Brush, RPH-CPP  Please be sure to bring in all your medications bottles to every appointment.   Need to Contact Us :  If you have any questions or concerns before your next appointment please send us  a message through [place] or call our office at [phone].    TO LEAVE A MESSAGE FOR THE NURSE SELECT OPTION 2, PLEASE LEAVE A MESSAGE INCLUDING: YOUR NAME DATE OF BIRTH CALL BACK NUMBER REASON FOR CALL**this is important as we prioritize the call backs  YOU WILL RECEIVE A CALL BACK THE SAME DAY AS LONG AS YOU CALL BEFORE 4:00 PM

## 2024-05-13 ENCOUNTER — Ambulatory Visit: Payer: Self-pay | Admitting: Family

## 2024-05-13 LAB — COMPREHENSIVE METABOLIC PANEL WITH GFR
ALT: 9 IU/L (ref 0–44)
AST: 22 IU/L (ref 0–40)
Albumin: 4.4 g/dL (ref 3.6–4.6)
Alkaline Phosphatase: 91 IU/L (ref 44–121)
BUN/Creatinine Ratio: 30 — ABNORMAL HIGH (ref 10–24)
BUN: 40 mg/dL — ABNORMAL HIGH (ref 10–36)
Bilirubin Total: 0.9 mg/dL (ref 0.0–1.2)
CO2: 21 mmol/L (ref 20–29)
Calcium: 9.5 mg/dL (ref 8.6–10.2)
Chloride: 104 mmol/L (ref 96–106)
Creatinine, Ser: 1.32 mg/dL — ABNORMAL HIGH (ref 0.76–1.27)
Globulin, Total: 1.8 g/dL (ref 1.5–4.5)
Glucose: 86 mg/dL (ref 70–99)
Potassium: 4.9 mmol/L (ref 3.5–5.2)
Sodium: 141 mmol/L (ref 134–144)
Total Protein: 6.2 g/dL (ref 6.0–8.5)
eGFR: 50 mL/min/{1.73_m2} — ABNORMAL LOW (ref >59)

## 2024-05-13 LAB — CBC
Hematocrit: 44.7 % (ref 37.5–51.0)
Hemoglobin: 14.6 g/dL (ref 13.0–17.7)
MCH: 33.5 pg — ABNORMAL HIGH (ref 26.6–33.0)
MCHC: 32.7 g/dL (ref 31.5–35.7)
MCV: 103 fL — ABNORMAL HIGH (ref 79–97)
Platelets: 570 10*3/uL — ABNORMAL HIGH (ref 150–450)
RBC: 4.36 x10E6/uL (ref 4.14–5.80)
RDW: 15.8 % — ABNORMAL HIGH (ref 11.6–15.4)
WBC: 8.9 10*3/uL (ref 3.4–10.8)

## 2024-06-25 ENCOUNTER — Other Ambulatory Visit: Payer: Self-pay | Admitting: Family

## 2024-07-17 NOTE — Progress Notes (Signed)
 Established Patient Visit   Chief Complaint: Chief Complaint  Patient presents with  . Congestive Heart Failure  . Atrial Fibrillation  . Hypertension   Date of Service: 07/17/2024 Date of Birth: 23-Jun-1927 PCP: Diedra Jerona Ruts, MD  History of Present Illness: David Hardin is a 89 y.o.male patient who returns for    1.  CABG x2 with LIMA to LAD, SVG to PDA 05/03/2012  2.  Chronic atrial fibrillation  3.  Essential hypertension  4.  Hyperlipidemia  5.  COPD / quit tobacco abuse 2015  6.  GI bleed 09/29/2019  7.  Chronic diastolic congestive heart failure   NYHA class II   ACC/AHA stage C   Etiology:  HTN   HFpEF  55-60%  8.  Bradycardia secondary to beta-blocker  9.  BPH   The patient was seen 04/24/2022 after returning from a cruise, where experienced a 9 pound weight gain with increased abdominal girth and shortness of breath.  He saw his primary care provider 04/20/2022 at which time BNP was 369.3.  The patient was prescribed additional Bumex 1 mg daily for 3 days with modest diuresis.  He saw Dr. Theotis who prescribed prednisone  Dosepak and DuoNebs.  Patient was prescribed metolazone  5 mg every morning for 3 days.  ECG revealed atrial fibrillation at a rate of 49 bpm.  7-day Holter monitor 12/14/2021 - 12/21/2021 revealed predominant atrial fibrillation with mean heart rate of 46 bpm, heart rate range 31 to 92 bpm, with occasional premature ventricular contractions.  The patient underwent ETT Myoview 05/09/2017, exercised 9 minutes on a Bruce protocol without chest pain or ECG changes. Gated scintigraphy revealed LV ejection fraction 61%.  SPECT analysis did not reveal evidence for scar or ischemia.  2D echocardiogram 02/17/2016 revealed LV ejection fraction of 55-60%.    2D echocardiogram 05/12/2022 revealed normal left ventricular function, with LVEF greater than 55%.  Patient hospitalized at [facility] 09/08/2022 with acute on chronic diastolic congestive heart failure, multifocal pneumonia,  and atrial fibrillation with rapid ventricular rate.  Since discharge, patient reports doing well overall.   The patient returns today for follow-up, reports I'm hanging in.  He denies exertional chest pain or shortness of breath.  He denies palpitations or heart racing.  He denies peripheral edema.  The patient is active, walking on treadmill 1 mile every day, but has slowed down following right total hip replacement surgery 09/25/2023.  Repeat 2D echocardiogram 09/10/2022 revealed LVEF 60-65%.  Lexiscan Myoview 07/25/2023 revealed LVEF 55% with focal fixed apical scar without evidence for ischemia.  The patient has chronic atrial fibrillation, chads vas score of 4, on Eliquis  for stroke prevention, which is tolerated well without bleeding side effects.  The patient has essential hypertension, blood pressure adequately controlled on amlodipine , losartan  and furosemide  which are well tolerated without apparent side effects. The patient follows a low-sodium, no added salt diet.  The patient has hyperlipidemia. LDL cholesterol was 35 on 8/69/7975, currently on atorvastatin , which is well tolerated without apparent side effects, followed by his primary care provider.  The patient follows a low-cholesterol, low-fat diet.  Past Medical and Surgical History  Past Medical History Past Medical History:  Diagnosis Date  . Anesthesia complication   . BPH (benign prostatic hypertrophy)    with elevated PSA, s/p bx x 2, negative  . COPD (chronic obstructive pulmonary disease) (CMS/HHS-HCC)   . Coronary artery disease   . Hematologic abnormality    Afib  . HTN (hypertension)   . Hyperlipidemia   .  NSTEMI (non-ST elevated myocardial infarction) (CMS/HHS-HCC) 05/02/2012  . Osteoarthritis   . Pulmonary nodule   . 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 Evaluated [facility].    Past Surgical History He has a past surgical history that includes Coronary artery  bypass graft (05/13/2012); prostate biopsy x 2, negative; Joint replacement (Left, 2006); Status post CABG with LIMA to LAD and SVG to PDA via minimally invasive approach on 05/03/12  ; Colonoscopy (10/02/2019); Cholecystectomy; arthroplasty hip total (Right, 09/06/2023); and computer-assisted surgical navigational procedure musculoskeletal (Right, 09/06/2023).   Medications and Allergies  Current Medications  Current Outpatient Medications  Medication Sig Dispense Refill  . potassium chloride  (KLOR-CON ) 20 MEQ ER tablet Take by mouth    . albuterol  90 mcg/actuation inhaler Inhale 2 inhalations into the lungs every 6 (six) hours as needed for Wheezing 1 each 6  . amLODIPine  (NORVASC ) 5 MG tablet TAKE ONE TABLET (5 MG) BY MOUTH EVERY DAY 90 tablet 1  . aspirin  81 MG EC tablet Take 81 mg by mouth daily.    . atorvastatin  (LIPITOR) 10 MG tablet TAKE ONE TABLET (10 MG) BY MOUTH EVERY DAY 90 tablet 1  . BREO ELLIPTA  100-25 mcg/dose DsDv inhaler Inhale 1 puff by mouth once daily 180 each 0  . dapagliflozin  propanediol (FARXIGA ) 10 mg tablet TAKE 1 TABLET BY MOUTH DAILY. DON'T START UNTIL 09/13/22 90 tablet 1  . ELIQUIS  5 mg tablet TAKE ONE TABLET (5 MG) BY MOUTH EVERY 12HOURS 180 tablet 1  . fluticasone  propionate (FLONASE ) 50 mcg/actuation nasal spray PLACE 2 SPRAYS INTO BOTH NOSTRILS ONCE DAILY. 16 g 5  . fluticasone -umeclidinium-vilanterol (TRELEGY ELLIPTA) 100-62.5-25 mcg inhaler INHALE 1 PUFF INTO THE LUNGS ONCE DAILY AS DIRECTED. 60 each 1  . FUROsemide  (LASIX ) 40 MG tablet TAKE ONE TABLET (40 MG) BY MOUTH EVERY DAY 90 tablet 1  . ipratropium-albuteroL  (DUO-NEB) nebulizer solution Take 3 mLs by nebulization 4 (four) times daily as needed for Wheezing for up to 30 days 90 mL 1  . Lactobac no.41/Bifidobact no.7 (PROBIOTIC-10 ORAL) Take by mouth once daily    . losartan  (COZAAR ) 50 MG tablet TAKE 1 TABLET BY MOUTH DAILY.  90 tablet 1  . metOLazone  (ZAROXOLYN ) 5 MG tablet Take 1 tablet (5 mg total) by  mouth once daily 30 tablet 11  . montelukast  (SINGULAIR ) 10 mg tablet Take 1 tablet (10 mg total) by mouth once daily 90 tablet 1  . multivitamin with minerals, EYE, (PRESERVISION AREDS-2) soft gel capsule Take 1 capsule by mouth 2 (two) times daily with meals 180 capsule 3  . sildenafil (REVATIO) 20 mg tablet TAKE 3 TABLETS BY MOUTH ONCE DAILY AS NEEDED PRIOR TO SEX 60 tablet 1  . tamsulosin  (FLOMAX ) 0.4 mg capsule Take 1 capsule (0.4 mg total) by mouth once daily Take 30 minutes after same meal each day. 90 capsule 1  . zinc 50 mg Tab Take 50 mg by mouth once daily     No current facility-administered medications for this visit.    Allergies: Patient has no known allergies.  Social and Family History  Social History  reports that he quit smoking about 12 years ago. His smoking use included cigars. He quit smokeless tobacco use about 42 years ago.  His smokeless tobacco use included chew. He reports current alcohol use of about 5.0 standard drinks of alcohol per week. He reports that he does not use drugs.  Family History Family History  Problem Relation Name  Age of Onset  . Cancer Mother    . Coronary Artery Disease (Blocked arteries around heart) Father Father   . Heart disease Father Father   . High blood pressure (Hypertension) Father Father   . Leukemia Sister    . Coronary Artery Disease (Blocked arteries around heart) Brother    . Anesthesia problems Neg Hx      Review of Systems   Review of Systems: The patient denies chest pain, without shortness of breath, without orthopnea, paroxysmal nocturnal dyspnea, with chronic mild right pedal edema, without palpitations, heart racing, without presyncope, syncope. Review of 10 Systems is negative except as described above.  Physical Examination   Vitals: BP 130/70   Pulse 60   Ht 167.6 cm (5' 6)   Wt 93 kg (205 lb)   SpO2 (!) 90%   BMI 33.09 kg/m  Ht:167.6 cm (5' 6) Wt:93 kg (205 lb) ADJ:Anib surface area is 2.08 meters  squared. Body mass index is 33.09 kg/m.  General: Alert and oriented. Well-appearing. No acute distress. HEENT: Pupils equally reactive to light and accomodation    Neck: Supple, no JVD Lungs: Normal effort of breathing; clear to auscultation bilaterally; no wheezes, rales, rhonchi Heart: Regular rate and rhythm. No murmur, rub, or gallop Abdomen:  nondistended, with normal bowel sounds Extremities: no cyanosis, clubbing. Trace right lower leg edema Peripheral Pulses: 2+ radial Skin: Warm, dry, no diaphoresis  Assessment   89 y.o. male with  1. Coronary artery disease involving native coronary artery of native heart without angina pectoris   2. Essential hypertension   3. Acute on chronic diastolic CHF (congestive heart failure), NYHA class 2 (CMS/HHS-HCC)   4. Atrial fib/flutter, transient (CMS/HHS-HCC)   5. Bradycardia   6. HTN (hypertension), benign   7. Hx of CABG   8. Paroxysmal atrial fibrillation (CMS/HHS-HCC)   9. S/P CABG (coronary artery bypass graft)   10. Chronic anticoagulation   11. SOB (shortness of breath) on exertion   12. Pure hypercholesterolemia     89 year old gentleman with known coronary disease, status post CABG, without chest pain.  ETT Myoview study 2018 revealed normal left ventricular function, without evidence for scar or ischemia.  Patient has chronic atrial fibrillation, on Eliquis  for stroke prevention.  Patient had GI bleed, underwent polypectomy, still remains on low-dose aspirin  and Eliquis  without recurrent bleeding.  The patient has essential hypertension, blood pressure well controlled current BP medications.  The patient has hyperlipidemia, with excellent control of LDL cholesterol on atorvastatin .  ECG 12/14/2021 reviewed atrial fibrillation at a rate of 46 bpm.  7-day Holter monitor revealed predominant atrial fibrillation with mean heart rate of 46 bpm with heart rate range 31 to 92 bpm.  Patient currently asymptomatic.  We discussed the  potential for permanent pacemaker implantation but at this time will defer in the absence of symptoms.  Patient was recently seen 04/24/2022 with 9 pound weight gain, shortness of breath, increased abdominal girth, pedal edema after returning from a cruise.  The patient was prescribed metolazone  5 mg every morning for the 3 days with prompt diuresis, 8 pound weight loss, and overall clinical improvement.  Patient was seen 09/07/2022 with recurrent pedal edema and 7 pound weight gain.  2D echocardiogram 05/12/2022 revealed normal left ventricular function, with LVEF greater than 55%.  The patient was started on metolazone , but was admitted to [facility] 09/08/2022 with acute on chronic diastolic congestive heart failure, multifocal pneumonia, and atrial fibrillation with rapid ventricular rate.  Since discharge, patient  reports doing well, without chest pain, shortness of breath, or peripheral edema.  Patient awaiting right total hip replacement surgery.  Lexiscan Myoview 07/25/2023 revealed LVEF 55% with focal fixed apical defect, without ischemia.   Plan   1.  Continue current medications 2.  Continue Eliquis  for stroke prevention 3.  Counseled patient about low-sodium diet 4.  DASH diet printed instructions given to the patient 5.  Continue atorvastatin  for hyperlipidemia management 6.  Continue furosemide  to 40 mg daily 7.  Instructed patient to take metolazone  5 mg gaining > 2 pounds overnight and 2 consecutive days 8.  Return to clinic for follow-up in 4 months   Orders Placed This Encounter  Procedures  . ECG 12-lead    Return in about 4 months (around 11/16/2024).  MARSA DOOMS, MD PhD [facility]

## 2024-07-28 NOTE — Progress Notes (Signed)
 David Hardin is a 89 y.o. male here for Medicare Wellness Visit  MEDICARE WELLNESS VISIT  Providers Rendering Care 1. Dr. Oliva Sayre (PCP) 2. Dr. Ammon (Cardiology) 3. Mason Croley/Dr. Aundria (GI) 4. Dr. Theotis (Pulmonology)  Functional Assessment (1) Hearing: Demonstrates no difficulty in hearing during normal conversation (2) Risk of Falls: Patient denies any falls or near falls in the last year, Gait steady without assistance during walk from waiting area to exam room (3) Home Safety: Patient feels secure in their home, There are operational smoke alarms in multiple areas of the home (4) Activities of Daily Living: Independently manages personal grooming and household chores, including cooking, cleaning and laundry. Manages Personal finances without assistance.  Depression Screening PHQ 2/9 from today's flowsheet  PHQ-2 PHQ-2 Over the last 2 weeks, how often have you been bothered by any of the following problems? Little interest or pleasure in doing things: Not at all Feeling down, depressed, or hopeless: Not at all Patient Health Questionnaire-2 Score: 0  PHQ-9 (if PHQ >=3)    Depression Severity and Treatment Recommendations:  0-4= None  5-9= Mild / Treatment: Support, educate to call if worse; return in one month  10-14= Moderate / Treatment: Support, watchful waiting; Antidepressant or Psychotherapy  15-19= Moderately severe / Treatment: Antidepressant OR Psychotherapy  >= 20 = Major depression, severe / Antidepressant AND Psychotherapy   Cognitive Impairment Patient denies episodes of losing things, being forgetful. Seems oriented to person, place and time. Responses appear appropriate and timely to this observer.  Social History   Socioeconomic History  . Marital status: Married  Tobacco Use  . Smoking status: Former    Types: Cigars    Quit date: 05/14/2012    Years since quitting: 12.2  . Smokeless tobacco: Former    Types: Chew    Quit  date: 06/04/1982  Vaping Use  . Vaping status: Never Used  Substance and Sexual Activity  . Alcohol use: Yes    Alcohol/week: 5.0 standard drinks of alcohol    Types: 5 Cans of beer per week    Comment: 5 per week  . Drug use: Never  . Sexual activity: Yes   Social Drivers of Corporate investment banker Strain: Low Risk  (07/28/2024)   Overall Financial Resource Strain (CARDIA)   . Difficulty of Paying Living Expenses: Not hard at all  Food Insecurity: No Food Insecurity (07/28/2024)   Hunger Vital Sign   . Worried About Programme researcher, broadcasting/film/video in the Last Year: Never true   . Ran Out of Food in the Last Year: Never true  Transportation Needs: No Transportation Needs (07/28/2024)   PRAPARE - Transportation   . Lack of Transportation (Medical): No   . Lack of Transportation (Non-Medical): No  Physical Activity: Inactive (07/26/2023)   Exercise Vital Sign   . Days of Exercise per Week: 0 days   . Minutes of Exercise per Session: 0 min  Social Connections: Socially Integrated (07/26/2023)   Social Connection and Isolation Panel   . Frequency of Communication with Friends and Family: More than three times a week   . Frequency of Social Gatherings with Friends and Family: Three times a week   . Attends Religious Services: More than 4 times per year   . Active Member of Clubs or Organizations: Yes   . Attends Banker Meetings: Never   . Marital Status: Married  Housing Stability: Low Risk  (07/28/2024)   Housing Stability Vital Sign   .  Unable to Pay for Housing in the Last Year: No   . Number of Times Moved in the Last Year: 1   . Homeless in the Last Year: No    Goals  Goals     . * Exercise 3x per week (30 min per time) (pt-stated)    .  Maintain health/healthy lifestyle        PREVENTION PLAN  Cardiovascular: Lipid panel annually Diabetes: A1C or fasting blood sugar annually  Glaucoma: N/A Hepatitis B (HBV) Vaccine:  Not Applicable Smoking Cessation:  Not  Applicable  Other Personalized Health Advice  Encouraged patient to exercise 5 days a week, walking, water aerobics, gentle stretching recommended. Increase dietary intake of fresh fruits and vegetables, reduce red meat to twice a week.  End of Life Counseling Patient has a living will in place  Patient Active Problem List  Diagnosis  . CAD (coronary artery disease)  . Atrial fib/flutter, transient (CMS/HHS-HCC)  . HTN (hypertension), benign  . Hyperlipidemia  . S/P CABG (coronary artery bypass graft)  . Lung nodule  . Osteoarthritis  . Hx of CABG  . Paroxysmal atrial fibrillation (CMS/HHS-HCC)  . SOB (shortness of breath) on exertion  . Pedal edema  . Bradycardia  . Acute on chronic diastolic CHF (congestive heart failure), NYHA class 2 (CMS/HHS-HCC)  . Preoperative evaluation of a medical condition to rule out surgical contraindications (TAR required)  . BPH (benign prostatic hyperplasia)  . Primary osteoarthritis of right hip  . Chronic anticoagulation  . H/O total hip arthroplasty, right    Past Medical History:  Diagnosis Date  . Anesthesia complication   . BPH (benign prostatic hypertrophy)    with elevated PSA, s/p bx x 2, negative  . COPD (chronic obstructive pulmonary disease) (CMS/HHS-HCC)   . Coronary artery disease   . Hematologic abnormality    Afib  . HTN (hypertension)   . Hyperlipidemia   . NSTEMI (non-ST elevated myocardial infarction) (CMS/HHS-HCC) 05/02/2012  . Osteoarthritis   . Pulmonary nodule   . 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 Evaluated [facility].    Past Surgical History:  Procedure Laterality Date  . JOINT REPLACEMENT Left 2006   hip  . CORONARY ARTERY BYPASS GRAFT  05/13/2012   MIDCAB  . COLONOSCOPY  10/02/2019   Tubular adenoma of the colon/No Repeat/TKT  . ARTHROPLASTY HIP TOTAL Right 09/06/2023   Procedure: ARTHROPLASTY, ACETABULAR AND PROXIMAL FEMORAL PROSTHETIC REPLACEMENT (TOTAL  HIP ARTHROPLASTY), WITH OR WITHOUT AUTOGRAFT OR ALLOGRAFT;  Surgeon: Bernardino Alyce Dover, MD;  Location: [facility] OR;  Service: Orthopedics;  Laterality: Right;  . COMPUTER-ASSISTED SURGICAL NAVIGATIONAL PROCEDURE MUSCULOSKELETAL Right 09/06/2023   Procedure: COMPUTER-ASSISTED SURGICAL NAVIGATIONAL PROCEDURE FOR MUSCULOSKELETAL PROCEDURES, IMAGE-LESS (LIST IN ADDITION TO PRIMARY PROCEDURE);  Surgeon: Bernardino Alyce Dover, MD;  Location: [facility] OR;  Service: Orthopedics;  Laterality: Right;  . CHOLECYSTECTOMY    . prostate biopsy x 2, negative    . Status post CABG with LIMA to LAD and SVG to PDA via minimally invasive approach on 05/03/12         Current Outpatient Medications:  .  albuterol  90 mcg/actuation inhaler, Inhale 2 inhalations into the lungs every 6 (six) hours as needed for Wheezing, Disp: 1 each, Rfl: 6 .  aspirin  81 MG EC tablet, Take 81 mg by mouth at bedtime, Disp: , Rfl:  .  atorvastatin  (LIPITOR) 10 MG tablet, TAKE ONE TABLET (10 MG) BY MOUTH EVERY DAY, Disp:  90 tablet, Rfl: 1 .  docusate (COLACE) 100 MG capsule, Take 100 mg by mouth 2 (two) times daily, Disp: , Rfl:  .  dutasteride (AVODART) 0.5 mg capsule, TAKE 1 CAPSULE BY MOUTH ONCE DAILY, Disp: 90 capsule, Rfl: 1 .  ELIQUIS  5 mg tablet, TAKE ONE TABLET (5 MG) BY MOUTH EVERY 12HOURS, Disp: 180 tablet, Rfl: 1 .  ENTRESTO  24-26 mg tablet, Take 1 tablet by mouth 2 (two) times daily, Disp: , Rfl:  .  FARXIGA  10 mg tablet, TAKE 1 TABLET BY MOUTH DAILY, Disp: 90 tablet, Rfl: 1 .  fluticasone -umeclidinium-vilanterol (TRELEGY ELLIPTA) 100-62.5-25 mcg inhaler, Inhale 1 Puff into the lungs once daily, Disp: 1 each, Rfl: 11 .  FUROsemide  (LASIX ) 40 MG tablet, TAKE ONE TABLET (40 MG) BY MOUTH EVERY DAY, Disp: 90 tablet, Rfl: 1 .  hydroxyurea (HYDREA) 500 mg capsule, Take 500 mg (one tab) Monday-Thursday, Disp: 48 capsule, Rfl: 1 .  ipratropium-albuteroL  (DUO-NEB) nebulizer solution, Take 3 mLs by nebulization 4 (four) times daily as  needed for Wheezing for up to 30 days, Disp: 90 mL, Rfl: 1 .  metOLazone  (ZAROXOLYN ) 2.5 MG tablet, Take 2.5 mg by mouth once daily as needed, Disp: , Rfl:  .  montelukast  (SINGULAIR ) 10 mg tablet, TAKE 1 TABLET BY MOUTH ONCE DAILY., Disp: 90 tablet, Rfl: 1 .  multivitamin with minerals, EYE, (PRESERVISION AREDS-2) soft gel capsule, Take 1 capsule by mouth 2 (two) times daily with meals, Disp: 180 capsule, Rfl: 3 .  sildenafil (REVATIO) 20 mg tablet, TAKE 3 TABLETS BY MOUTH ONCE DAILY AS NEEDED PRIOR TO SEX, Disp: 60 tablet, Rfl: 1 .  spironolactone  (ALDACTONE ) 25 MG tablet, Take 1 tablet by mouth once daily, Disp: , Rfl:  .  tamsulosin  (FLOMAX ) 0.4 mg capsule, TAKE 1 CAPSULE BY MOUTH TWICE DAILY, Disp: 180 capsule, Rfl: 3  No Known Allergies  Results for orders placed or performed in visit on 07/21/24  CBC w/auto Differential (5 Part)  Result Value Ref Range   WBC (White Blood Cell Count) 6.0 4.1 - 10.2 10^3/uL   RBC (Red Blood Cell Count) 3.69 (L) 4.69 - 6.13 10^6/uL   Hemoglobin 12.6 (L) 14.1 - 18.1 gm/dL   Hematocrit 60.5 (L) 59.9 - 52.0 %   MCV (Mean Corpuscular Volume) 106.8 (H) 80.0 - 100.0 fl   MCH (Mean Corpuscular Hemoglobin) 34.1 (H) 27.0 - 31.2 pg   MCHC (Mean Corpuscular Hemoglobin Concentration) 32.0 32.0 - 36.0 gm/dL   Platelet Count 552 849 - 450 10^3/uL   RDW-CV (Red Cell Distribution Width) 15.7 (H) 11.6 - 14.8 %   MPV (Mean Platelet Volume) 10.7 9.4 - 12.4 fl   Neutrophils 3.55 1.50 - 7.80 10^3/uL   Lymphocytes 1.58 1.00 - 3.60 10^3/uL   Monocytes 0.54 0.00 - 1.50 10^3/uL   Eosinophils 0.28 0.00 - 0.55 10^3/uL   Basophils 0.05 0.00 - 0.09 10^3/uL   Neutrophil % 58.9 32.0 - 70.0 %   Lymphocyte % 26.2 10.0 - 50.0 %   Monocyte % 9.0 4.0 - 13.0 %   Eosinophil % 4.6 1.0 - 5.0 %   Basophil% 0.8 0.0 - 2.0 %   Immature Granulocyte % 0.5 <=0.7 %   Immature Granulocyte Count 0.03 <=0.06 10^3/L  Comprehensive Metabolic Panel (CMP)  Result Value Ref Range   Glucose 90 70  - 110 mg/dL   Sodium 856 863 - 854 mmol/L   Potassium 4.4 3.6 - 5.1 mmol/L   Chloride 106 97 - 109 mmol/L   Carbon  Dioxide (CO2) 31.4 22.0 - 32.0 mmol/L   Urea Nitrogen (BUN) 27 (H) 7 - 25 mg/dL   Creatinine 1.2 0.7 - 1.3 mg/dL   Glomerular Filtration Rate (eGFR) 56 (L) >60 mL/min/1.73sq m   Calcium  9.0 8.7 - 10.3 mg/dL   AST  21 8 - 39 U/L   ALT  9 6 - 57 U/L   Alk Phos (alkaline Phosphatase) 76 34 - 104 U/L   Albumin 4.0 3.5 - 4.8 g/dL   Bilirubin, Total 0.6 0.3 - 1.2 mg/dL   Protein, Total 5.9 (L) 6.1 - 7.9 g/dL   A/G Ratio 2.1 1.0 - 5.0 gm/dL  Hemoglobin J8R  Result Value Ref Range   Hemoglobin A1C 5.4 4.2 - 5.6 %   Average Blood Glucose (Calc) 108 mg/dL   Narrative   Normal Range:    4.2 - 5.6% Increased Risk:  5.7 - 6.4% Diabetes:        >= 6.5% Glycemic Control for adults with diabetes:  <7%    Lipid Panel w/calc LDL  Result Value Ref Range   Cholesterol, Total 88 (L) 100 - 200 mg/dL   Triglyceride 43 35 - 199 mg/dL   HDL (High Density Lipoprotein) Cholesterol 43.1 29.0 - 71.0 mg/dL   LDL Calculated 36 0 - 130 mg/dL   VLDL Cholesterol 9 mg/dL   Cholesterol/HDL Ratio 2.0     Review of Systems  Constitutional: No fever, No fatigue, No night sweats, No weakness, No significant weight change  Skin: No rash, No suspicious or changing lesions  HEENT: No change in vision, No headaches, No mouth sores, No difficulty swallowing  Cardiovascular: No chest pain, No palpitations, No lower extremity edema, No dyspnea on exertion  Respiratory: No shortness of breath, yes cough, No hemoptysis  GI: No blood in stool, No significant change in bowel habits, No constipation, No diarrhea, No heartburn, No vomiting  Urologic: No difficulty urinating, No dysuria, No hematuria, No urinary urgency, No urinary frequency  Musculoskeletal: No joint pain, No joint stiffness, No joint swelling  Neurologic: No numbness or tingling, No memory loss, No seizures, No tremor  Psychologic:  No depression, No anxiety, No sleep disturbance  Exam  Vitals:   07/28/24 1052  BP: 124/78  Pulse: 50  SpO2: 96%  Weight: 93 kg (205 lb)  Height: 167.6 cm (5' 6)   Body mass index is 33.09 kg/m.  General: Well-appearing, well-developed, well-nourished, alert and oriented, appears stated age, no acute distress  HEENT: Ear canals clear bilaterally, TM's clear without abnormality, neck supple with no enlarged cervical lymph nodes and no carotid bruits, thyroid non-tender without nodules. Head is normocephalic and atraumatic  Respiratory: Clear to auscultation bilaterally with no wheezing/rhonchi/crackles. No increased work of breathing. Good air movement  Cardiovascular: Normal S1 and S2 with regular rhythm and normal rate with no murmur, No JVD, extremities without edema, pedal pulses normal  GI: abdomen soft, nontender, nondistended, bowel sounds present, no masses or organomegaly palpated  GU: Deferred based on lack of symptoms  Neurologic: Alert and oriented x 3, cranial nerves grossly intact, no focal deficits  Musculoskeletal: No joint erythema, full range of motion without pain in upper and lower extremities  Skin: No rash or concerning appearing skin lesions  Psych: Affect appropriate, mood normal, insight normal, judgment normal  Impression/Plan  Wellness exam  I discussed the importance of a healthy lifestyle including appropriate food choices and regular exercise. I recommended a diet that is moderate in fiber with fresh fruits and  vegetables and low in saturated fats and simple carbohydrates. I recommended getting exercise based on patient's exercise capacity ideally 150 minutes per week. The patient is up to date on age appropriate screening exams and vaccines.  The patient was given a copy of the prevention plan.  Flu vaccine annually Pneumonia vaccine once after age 99 Shingles vaccine once after age 61 Colonoscopy every 10 years until age 31  COPD  exacerbation: I will have him do a course of prednisone  and will increase the dose of his trelegy ellipta.      *Some images could not be shown.

## 2024-08-01 ENCOUNTER — Ambulatory Visit: Admitting: Urology

## 2024-08-06 ENCOUNTER — Encounter: Payer: Self-pay | Admitting: Urology

## 2024-08-06 ENCOUNTER — Ambulatory Visit (INDEPENDENT_AMBULATORY_CARE_PROVIDER_SITE_OTHER): Admitting: Urology

## 2024-08-06 VITALS — BP 126/56 | HR 42 | Ht 67.5 in | Wt 201.0 lb

## 2024-08-06 DIAGNOSIS — R972 Elevated prostate specific antigen [PSA]: Secondary | ICD-10-CM | POA: Diagnosis not present

## 2024-08-06 DIAGNOSIS — N401 Enlarged prostate with lower urinary tract symptoms: Secondary | ICD-10-CM | POA: Diagnosis not present

## 2024-08-06 LAB — URINALYSIS, COMPLETE
Bilirubin, UA: NEGATIVE
Glucose, UA: NEGATIVE
Ketones, UA: NEGATIVE
Leukocytes,UA: NEGATIVE
Nitrite, UA: NEGATIVE
Protein,UA: NEGATIVE
RBC, UA: NEGATIVE
Specific Gravity, UA: 1.02 (ref 1.005–1.030)
Urobilinogen, Ur: 0.2 mg/dL (ref 0.2–1.0)
pH, UA: 6 (ref 5.0–7.5)

## 2024-08-06 LAB — MICROSCOPIC EXAMINATION: Epithelial Cells (non renal): >10 /HPF — AB (ref 0–10)

## 2024-08-06 LAB — BLADDER SCAN AMB NON-IMAGING

## 2024-08-06 NOTE — Progress Notes (Signed)
 08/06/2024 12:21 PM   David Hardin November 04, 1927 [id number]  Referring provider: Landy Margaretann Kay, FNP [address] [place],  [place] [address]  Chief Complaint  Patient presents with   Benign Prostatic Hypertrophy    HPI: David Hardin is a 89 y.o. male referred for evaluation of BPH.  I had seen him back in mid-late 1990s/early 2000 for BPH and elevated PSA Saw PCP 06/25/2024 and was complaining of dysuria.  Urinalysis with pyuria; urine culture grew mixed flora Was treated with a 10-day course of cephalexin with resolution of his symptoms Presently has no voiding complaints.  He is on tamsulosin  and dutasteride. Underwent MR fusion biopsy by Dr. Gala in 2023 for a PSA of 16.1.  MRI remarkable for 123 cc volume with a PI-RADS 4/PI-RADS 3 lesions.  Biopsy showed benign prostate tissue   PMH: Past Medical History:  Diagnosis Date   (HFpEF) heart failure with preserved ejection fraction (HCC) 05/03/2012   a.) TTE 05/03/2012: EF 50-55%, apical AK, mild AR; b.) TTE 05/11/2022: EF >55%, MAC, mod BAE, mod RVE, triv AR/PR, mild MR/TR; c.) TTE 09/10/2022: EF 60-65%, mild LVH, mod BAE, mild MR, AoV sclerosis without stenosis   Aortic atherosclerosis (HCC)    Atrial fibrillation (HCC)    a.) CHA2DS2VASc = 5 (age x2, HFpEF, HTN, NSTEMI);  b.) rate/rhythm maintained without pharmacological intervention; chronically anticoagulated with apixaban    BPH (benign prostatic hyperplasia)    Bradycardia    Cardiomegaly    COPD (chronic obstructive pulmonary disease) (HCC)    Coronary artery disease 05/03/2012   a.) LHC 05/03/2012:  EF 56%, apical dyskinesis, 99% mLAD, 70% D1-1, 75% D1-2, 40% mLCx, 50% pRCA, 99% dRCA, 99% RDPA --> consult CVTS; b.) 2v MIDCAB at [facility] (LIMA-LAD, SVG-PDA)   DDD (degenerative disc disease), lumbar    Diverticulosis    DOE (dyspnea on exertion)    Elevated PSA    Erectile dysfunction    a.) on PDE5i (sildenafil) PRN   GI bleed 09/29/2019   History of kidney  stones    History of tobacco abuse    a.) quit 2015   Hyperlipidemia    Hypertension    Long term current use of anticoagulant    a.) apixaban    NSTEMI (non-ST elevated myocardial infarction) (HCC) 05/02/2012   a.) NSTEMI 05/02/2012 --> LHC 05/03/2012 at [facility] --> EF 56%, apical dyskinesis, 99% mLAD, 70% D1-1, 75% D1-2, 40% mLCx, 50% pRCA, 99% dRCA, 99% RDPA --> consult CVTS; b.) 2v MIDCAB at [facility] (LIMA-LAD, SVG-PDA)   Osteoarthritis    Peripheral edema    Pneumonia    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   a.) 2v MIDCAB --> LIMA-LAD, SVG-PDA   Squamous cell carcinoma of skin 11/21/2021   L dorsum hand - ED&C   Squamous cell carcinoma of skin 11/21/2021   L dorsum forearm - ED&C   Thoracic ascending aortic aneurysm (HCC) 06/16/2020   a.) CT chest 06/16/2020: measured 4.6 x 4.0 cm; b.) CT chest 04/26/2022: measured 4.1 cm; c.) CTA chest 09/08/2022: measured 4.4 cm    Surgical History: Past Surgical History:  Procedure Laterality Date   BYPASS AXILLA/BRACHIAL ARTERY     CHOLECYSTECTOMY     COLONOSCOPY WITH PROPOFOL  N/A 10/02/2019   Procedure: COLONOSCOPY WITH PROPOFOL ;  Surgeon: Toledo, Ladell POUR, MD;  Location: [facility] ENDOSCOPY;  Service: Gastroenterology;  Laterality: N/A;   MINIMALLY INVASIVE DIRECT CORONARY ARTERY BYPASS (MIDCAB) Left 05/13/2012   Procedure: MINIMALLY INVASIVE DIRECT CORONARY ARTERY BYPASS (2v MIDCAB); Location:  [facility]   PROSTATE BIOPSY N/A 10/19/2022   Procedure: PROSTATE BIOPSY GRAYCE;  Surgeon: Kassie Ozell SAUNDERS, MD;  Location: [facility] ORS;  Service: Urology;  Laterality: N/A;   TOTAL HIP ARTHROPLASTY Left 2006    Home Medications:  Allergies as of 08/06/2024   No Known Allergies      Medication List        Accurate as of August 06, 2024 12:21 PM. If you have any questions, ask your nurse or doctor.          albuterol  108 (90 Base) MCG/ACT inhaler Commonly known as: VENTOLIN  HFA Inhale 2 puffs into the lungs every 6 (six) hours as needed.   aspirin  81 MG chewable  tablet Chew 81 mg by mouth daily.   atorvastatin  10 MG tablet Commonly known as: LIPITOR Take 10 mg by mouth daily.   dapagliflozin  propanediol 10 MG Tabs tablet Commonly known as: FARXIGA  Take 1 tablet (10 mg total) by mouth daily.   docusate sodium  100 MG capsule Commonly known as: COLACE Take 2 capsules (200 mg total) by mouth 2 (two) times daily.   dutasteride 0.5 MG capsule Commonly known as: AVODART Take 0.5 mg by mouth daily.   Eliquis  5 MG Tabs tablet Generic drug: apixaban  Take 5 mg by mouth every 12 (twelve) hours.   Entresto  24-26 MG Generic drug: sacubitril-valsartan Take 1 tablet by mouth 2 (two) times daily.   fluticasone  50 MCG/ACT nasal spray Commonly known as: FLONASE  Place 2 sprays into the nose daily as needed.   furosemide  40 MG tablet Commonly known as: LASIX  Take 20 mg by mouth 2 (two) times daily.   hydroxyurea 500 MG capsule Commonly known as: HYDREA Take 500 mg by mouth daily. May take with food to minimize GI side effects.   ipratropium-albuterol  0.5-2.5 (3) MG/3ML Soln Commonly known as: DUONEB Take 3 mLs by nebulization every 6 (six) hours as needed (shortness of breath, wheezing.).   metolazone  2.5 MG tablet Commonly known as: ZAROXOLYN  Take 1 tablet (2.5 mg total) by mouth as needed.   montelukast  10 MG tablet Commonly known as: SINGULAIR  Take 10 mg by mouth at bedtime.   potassium chloride  SA 20 MEQ tablet Commonly known as: KLOR-CON  M Take 1 tablet (20 mEq total) by mouth as needed. Take this if you take metolazone    PreserVision AREDS Caps Take 1 capsule by mouth 2 (two) times daily.   spironolactone  25 MG tablet Commonly known as: ALDACTONE  TAKE 1 TABLET BY MOUTH DAILY   tamsulosin  0.4 MG Caps capsule Commonly known as: FLOMAX  Take 0.8 mg by mouth daily. Take 1 capsule (0.4 mg total) by mouth once daily Take 30 minutes after same meal each day.   Trelegy Ellipta 100-62.5-25 MCG/ACT Aepb Generic drug:  Fluticasone -Umeclidin-Vilant Inhale 1 puff into the lungs daily.        Allergies: No Known Allergies  Family History: Family History  Problem Relation Age of Onset   Breast cancer Mother    Heart attack Father     Social History:  reports that he quit smoking about 10 years ago. His smoking use included cigarettes. He has never used smokeless tobacco. He reports current alcohol use of about 5.0 standard drinks of alcohol per week. He reports that he does not use drugs.   Physical Exam: BP (!) 126/56   Pulse (!) 42   Ht 5' 7.5 (1.715 m)   Wt 201 lb (91.2 kg)   BMI 31.02 kg/m   Constitutional:  Alert, No acute  distress. HEENT: [place] AT Respiratory: Normal respiratory effort, no increased work of breathing. Psychiatric: Normal mood and affect.   Assessment & Plan:    1. Benign prostatic hyperplasia with lower urinary tract symptoms No bothersome LUTS on tamsulosin /dutasteride PVR today 5 mL  2.  Elevated PSA Recent benign biopsy for PSA of 16.1 Based on recent negative biopsy and prostate volume he has elected to stop PSA checks which is certainly reasonable.  We discussed age the incidence of high-grade prostate cancer in his age group extremely low    He would like to follow-up as needed   Glendia JAYSON Barba, MD  [facility] [address], [address] [place], [place] [address] [phone]

## 2024-08-08 ENCOUNTER — Telehealth: Payer: Self-pay | Admitting: Family

## 2024-08-08 NOTE — Telephone Encounter (Signed)
 Called to confirm/remind patient of [facility] appointment [facility] on 08/12/24.   Appointment:   [] Confirmed  [x] Left mess   [] No answer/No voice mail  [] VM Full/unable to leave message  [] Phone not in service  Patient reminded to bring all medications and/or complete list.  Confirmed patient has transportation. Gave directions, instructed to utilize valet parking.

## 2024-08-10 NOTE — Progress Notes (Unsigned)
 Advanced Heart Failure Clinic Note    PCP: David Lame, MD (last seen 02/25; returns 08/25) Primary Cardiologist: David Blunt, MD (last seen 04/25)  Chief Complaint: fatigue  HPI:  David Hardin is a 89 y/o male with a history of CAD with CABG X 2 with LIMA to LAD, SVG to PDA 05/03/2012, hyperlipidemia, HTN, atrial fibrillation, COPD, thrombocytosis (2020), previous tobacco use (stopped 2015) and chronic heart failure. Had total right hip arthroplasty 09/06/23.  Was in the ED 09/13/22 due to urinary retention after foley removed the day prior. Foley catheter replaced and symptoms improved and he was released.   Echo 09/10/22: EF of 60-65% along with mild LVH, moderate LAE, moderate RAE and mild David/AR.   Stress test 07/25/23: EF 55% No regional wall motion maladies  Focal apical scar without significant ischemia   Seen in [facility] 04/25 where losartan  was stopped and entresto  24/26mg  BID was started.   He presents today for a HF follow-up visit with a chief complaint of very minimal fatigue. Denies shortness of breath, chest pain, palpitations, cough, abdominal distention, pedal edema, dizziness or difficulty sleeping.   Has taken 1 dose of metolazone / potassium since last here as he went to a family cookout and ate foods I shouldn't have ate and drank alcohol. Home weight went up to 207 pounds afterwards, so he took 1 dose of metolazone  and weight came back down. Headed to [place]  late July/ early August to be inducted into the [employer].   ROS: All systems negative except as listed in HPI, PMH and Problem List.  SH:  Social History   Socioeconomic History   Marital status: Married    Spouse name: David Hardin   Number of children: Not on file   Years of education: Not on file   Highest education level: Not on file  Occupational History   Occupation: retired  Tobacco Use   Smoking status: Former    Current packs/day: 0.00    Types: Cigarettes    Quit date: 2015     Years since quitting: 10.6   Smokeless tobacco: Never  Vaping Use   Vaping status: Never Used  Substance and Sexual Activity   Alcohol use: Yes    Alcohol/week: 5.0 standard drinks of alcohol    Types: 5 Shots of liquor per week   Drug use: No   Sexual activity: Never  Other Topics Concern   Not on file  Social History Narrative   Not on file   Social Drivers of Health   Financial Resource Strain: Low Risk  (07/28/2024)   Received from [facility]   Overall Financial Resource Strain (CARDIA)    Difficulty of Paying Living Expenses: Not hard at all  Food Insecurity: No Food Insecurity (07/28/2024)   Received from [facility]   Hunger Vital Sign    Within the past 12 months, you worried that your food would run out before you got the money to buy more.: Never true    Within the past 12 months, the food you bought just didn't last and you didn't have money to get more.: Never true  Transportation Needs: No Transportation Needs (07/28/2024)   Received from [facility] - Transportation    In the past 12 months, has lack of transportation kept you from medical appointments or from getting medications?: No    Lack of Transportation (Non-Medical): No  Physical Activity: Inactive (07/26/2023)   Received from [facility]   Exercise Vital Sign    On average, how many days per week do you engage in moderate to strenuous exercise (like a brisk walk)?: 0 days    On average, how many minutes do you engage in exercise at this level?: 0 min  Stress: Not on file  Social Connections: Socially Integrated (07/26/2023)   Received from [facility]   Social Connection and Isolation Panel    In a typical week, how many times do you talk on the phone with family, friends, or neighbors?: More than three times a week    How often do you get together with friends or relatives?: Three times a week    How often  do you attend church or religious services?: More than 4 times per year    Do you belong to any clubs or organizations such as church groups, unions, fraternal or athletic groups, or school groups?: Yes    How often do you attend meetings of the clubs or organizations you belong to?: Never    Are you married, widowed, divorced, separated, never married, or living with a partner?: Married  Intimate Partner Violence: Not At Risk (09/09/2022)   Humiliation, Afraid, Rape, and Kick questionnaire    Fear of Current or Ex-Partner: No    Emotionally Abused: No    Physically Abused: No    Sexually Abused: No    FH:  Family History  Problem Relation Age of Onset   Breast cancer Mother    Heart attack Father     Past Medical History:  Diagnosis Date   (HFpEF) heart failure with preserved ejection fraction (HCC) 05/03/2012   a.) TTE 05/03/2012: EF 50-55%, apical AK, mild AR; b.) TTE 05/11/2022: EF >55%, MAC, mod BAE, mod RVE, triv AR/PR, mild David/TR; c.) TTE 09/10/2022: EF 60-65%, mild LVH, mod BAE, mild David, AoV sclerosis without stenosis   Aortic atherosclerosis (HCC)    Atrial fibrillation (HCC)    a.) CHA2DS2VASc = 5 (age x2, HFpEF, HTN, NSTEMI);  b.) rate/rhythm maintained without pharmacological intervention; chronically anticoagulated with apixaban    BPH (benign prostatic hyperplasia)    Bradycardia    Cardiomegaly    COPD (chronic obstructive pulmonary disease) (HCC)    Coronary artery disease 05/03/2012   a.) LHC 05/03/2012:  EF 56%, apical dyskinesis, 99% mLAD, 70% D1-1, 75% D1-2, 40% mLCx, 50% pRCA, 99% dRCA, 99% RDPA --> consult CVTS; b.) 2v MIDCAB at [facility] (LIMA-LAD, SVG-PDA)   DDD (degenerative disc disease), lumbar    Diverticulosis    DOE (dyspnea on exertion)    Elevated PSA    Erectile dysfunction    a.) on PDE5i (sildenafil) PRN   GI bleed 09/29/2019   History of kidney stones    History of tobacco abuse    a.) quit 2015   Hyperlipidemia    Hypertension    Long term  current use of anticoagulant    a.) apixaban    NSTEMI (non-ST elevated myocardial infarction) (HCC) 05/02/2012   a.) NSTEMI 05/02/2012 --> LHC 05/03/2012 at [facility] --> EF 56%, apical dyskinesis, 99% mLAD, 70% D1-1, 75% D1-2, 40% mLCx, 50% pRCA, 99% dRCA, 99% RDPA --> consult CVTS; b.) 2v MIDCAB at [facility] (LIMA-LAD, SVG-PDA)   Osteoarthritis    Peripheral edema    Pneumonia    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     apixaban  (ELIQUIS ) 5 MG TABS tablet Take 5 mg by mouth every 12 (twelve) hours.     aspirin  81 MG chewable tablet Chew 81 mg by mouth daily.     atorvastatin  (LIPITOR) 10 MG tablet Take 10 mg by mouth daily.     dapagliflozin  propanediol (FARXIGA ) 10 MG TABS tablet Take 1 tablet (10 mg total) by mouth daily. 30 tablet 1   docusate sodium  (COLACE) 100 MG capsule Take 2 capsules (200 mg total) by mouth 2 (two) times daily. 120 capsule 3   dutasteride (AVODART) 0.5 MG capsule Take 0.5 mg by mouth daily.     fluticasone  (FLONASE ) 50 MCG/ACT nasal spray Place 2 sprays into the nose daily as needed.     furosemide  (LASIX ) 40 MG tablet Take 20 mg by mouth 2 (two) times daily.     hydroxyurea (HYDREA) 500 MG capsule Take 500 mg by mouth daily. May take with food to minimize GI side effects.     ipratropium-albuterol  (DUONEB) 0.5-2.5 (3) MG/3ML SOLN Take 3 mLs by nebulization every 6 (six) hours as needed (shortness of breath, wheezing.). 360 mL 0   metolazone   (ZAROXOLYN ) 2.5 MG tablet Take 1 tablet (2.5 mg total) by mouth as needed. 10 tablet 1   montelukast  (SINGULAIR ) 10 MG tablet Take 10 mg by mouth at bedtime.     Multiple Vitamins-Minerals (PRESERVISION AREDS) CAPS Take 1 capsule by mouth 2 (two) times daily.     potassium chloride  SA (KLOR-CON  M) 20 MEQ tablet Take 1 tablet (20 mEq total) by mouth as needed. Take this if you take metolazone  10 tablet 5   sacubitril-valsartan (ENTRESTO ) 24-26 MG Take 1 tablet by mouth 2 (two) times daily. 60 tablet 11   spironolactone  (ALDACTONE ) 25 MG tablet TAKE 1 TABLET BY MOUTH DAILY 30 tablet 5   tamsulosin  (FLOMAX ) 0.4 MG CAPS capsule Take 0.8 mg by mouth daily. Take 1 capsule (0.4 mg total) by mouth once daily Take 30 minutes after same meal each day.     TRELEGY ELLIPTA 100-62.5-25 MCG/ACT AEPB Inhale 1 puff into the lungs daily.     No current facility-administered medications for this visit.   There were no vitals filed for this visit.  Wt Readings from Last 3 Encounters:  08/06/24 201 lb (91.2 kg)  05/12/24 207 lb 12.8 oz (94.3 kg)  03/27/24 206 lb (93.4 kg)   Lab Results  Component Value Date   CREATININE 1.32 (H) 05/12/2024   CREATININE 1.28 (H) 02/07/2024   CREATININE 1.00 05/09/2023    PHYSICAL EXAM:  General: Well appearing. No resp difficulty HEENT: HOH Neck: supple, no JVD Cor: Regular rhythm, rate. No rubs, gallops or murmurs Lungs: clear Abdomen: soft, nontender, nondistended. Extremities: no cyanosis, clubbing, rash, trace pitting edema bilateral shins Neuro: alert & oriented X 3. Moves all 4 extremities w/o difficulty. Affect pleasant   ECG: not done    ASSESSMENT & PLAN:  1: Chronic NICM with preserved ejection fraction- - likely due to HTN/ AF - NYHA II - euvolemic -  weighing daily; reminded to call for an overnight weight gain of > 2 pounds or a weekly weight gain of > 5 pounds - weight stable from last visit here 6 weeks ago - echo 09/10/22: EF of 60-65% along  with mild LVH, moderate LAE, moderate RAE and mild David/AR. - not adding salt and his daughters are cooking meals for him - continue farxiga  10mg  daily - continue furosemide  20mg  BID - continue entresto  24/26mg  BID  - continue spironolactone  25mg  daily - continue metolazone  2.5mg / potassium 20meq PRN; has taken 1 dose of this after family cookout - CMP today - BNP 09/08/22 was 544.8  2: HTN- - BP 118/98 - saw PCP David Hardin) 02/25 - BMP 04/09/24 reviewed: sodium 144, potassium 4.1, creatinine 1.4 and GFR 47 - CMP today  3: Atrial fibrillation- - continue apixaban  5mg  BID - continue aspirin  81mg  daily - saw cardiology (David Hardin) 04/25  4: COPD- - saw pulmonology David Hardin) 11/24 - using trelegy & nebulizer  5: Thrombocytosis- - saw hematology (David Hardin) 04/25 & bone marrow biopsy was deferred - CBC 04/09/24 reviewed and showed WBC 9.5, Hg 14.8, PLT 562 - denies any bleeding - per hematology request, repeat CBC today and fax results to them   Return in 3 months, sooner if needed.   Ellouise DELENA Class, FNP 08/10/24

## 2024-08-12 ENCOUNTER — Ambulatory Visit: Attending: Family | Admitting: Family

## 2024-08-12 ENCOUNTER — Encounter: Payer: Self-pay | Admitting: Family

## 2024-08-12 VITALS — BP 115/59 | HR 50 | Wt 203.0 lb

## 2024-08-12 DIAGNOSIS — Z79899 Other long term (current) drug therapy: Secondary | ICD-10-CM | POA: Insufficient documentation

## 2024-08-12 DIAGNOSIS — I480 Paroxysmal atrial fibrillation: Secondary | ICD-10-CM | POA: Insufficient documentation

## 2024-08-12 DIAGNOSIS — M546 Pain in thoracic spine: Secondary | ICD-10-CM | POA: Insufficient documentation

## 2024-08-12 DIAGNOSIS — D75839 Thrombocytosis, unspecified: Secondary | ICD-10-CM | POA: Insufficient documentation

## 2024-08-12 DIAGNOSIS — R5383 Other fatigue: Secondary | ICD-10-CM | POA: Insufficient documentation

## 2024-08-12 DIAGNOSIS — J449 Chronic obstructive pulmonary disease, unspecified: Secondary | ICD-10-CM | POA: Insufficient documentation

## 2024-08-12 DIAGNOSIS — I5032 Chronic diastolic (congestive) heart failure: Secondary | ICD-10-CM | POA: Insufficient documentation

## 2024-08-12 DIAGNOSIS — Z87891 Personal history of nicotine dependence: Secondary | ICD-10-CM | POA: Insufficient documentation

## 2024-08-12 DIAGNOSIS — I10 Essential (primary) hypertension: Secondary | ICD-10-CM

## 2024-08-12 DIAGNOSIS — Z7982 Long term (current) use of aspirin: Secondary | ICD-10-CM | POA: Diagnosis not present

## 2024-08-12 DIAGNOSIS — I2510 Atherosclerotic heart disease of native coronary artery without angina pectoris: Secondary | ICD-10-CM | POA: Diagnosis not present

## 2024-08-12 DIAGNOSIS — E785 Hyperlipidemia, unspecified: Secondary | ICD-10-CM | POA: Diagnosis not present

## 2024-08-12 DIAGNOSIS — Z7901 Long term (current) use of anticoagulants: Secondary | ICD-10-CM | POA: Insufficient documentation

## 2024-08-12 DIAGNOSIS — Z951 Presence of aortocoronary bypass graft: Secondary | ICD-10-CM | POA: Insufficient documentation

## 2024-08-12 DIAGNOSIS — I428 Other cardiomyopathies: Secondary | ICD-10-CM | POA: Diagnosis not present

## 2024-08-12 DIAGNOSIS — Z8249 Family history of ischemic heart disease and other diseases of the circulatory system: Secondary | ICD-10-CM | POA: Diagnosis not present

## 2024-08-12 DIAGNOSIS — I110 Hypertensive heart disease with heart failure: Secondary | ICD-10-CM | POA: Insufficient documentation

## 2024-08-12 NOTE — Patient Instructions (Signed)
 It was good to see you today!  If you receive a satisfaction survey regarding the Heart Failure Clinic, please take the time to fill it out. This way we can continue to provide excellent care and make any changes that need to be made.

## 2024-09-18 ENCOUNTER — Telehealth: Payer: Self-pay

## 2024-09-18 NOTE — Progress Notes (Unsigned)
 09/19/2024 9:41 PM   David Hardin 1927-01-16 [id number]  Referring provider: Diedra Lame, MD [phone] [name] [facility] [place],  [place] [address]  Urological history: 1. Elevated PSA - PSA (2021) 10.10 - fusion biopsy (2023) negative   2. BPH with LU TS - prostate MRI (2023) volume 123 cc - tamsulosin /dutasteride    No chief complaint on file.  HPI: David Hardin is a 89 y.o. man who presents today for burning with urination.    Previous records reviewed.  I PSS ***  He reports sensation of incomplete bladder emptying, urinary frequency, urinary intermittency, urinary urgency, a weak urinary stream, having to strain to void, nocturia x ***, leaking before being able to reach the restroom, leaking with coughing, leaking without awareness, and post void dribbling.     He is wearing *** pads//depends  daily.    Patient denies any modifying or aggravating factors.  Patient denies any recent UTI's, gross hematuria, dysuria or suprapubic/flank pain.  Patient denies any fevers, chills, nausea or vomiting.  ***  He has a family history of PCa, colon cancer, ovarian cancer and/or breast cancer with ***.   He does not have a family history of PCa, colon cancer, ovarian cancer, and/or breast cancer .***     UA***  PVR***  Serum creatinine (07/2024) 1.2, eGFR 56  Hemoglobin A1c (07/2024) 5.4   Diuretics: Furosemide  40 mg   Fluid consumptiom: ***  PMH: Past Medical History:  Diagnosis Date   (HFpEF) heart failure with preserved ejection fraction (HCC) 05/03/2012   a.) TTE 05/03/2012: EF 50-55%, apical AK, mild AR; b.) TTE 05/11/2022: EF >55%, MAC, mod BAE, mod RVE, triv AR/PR, mild MR/TR; c.) TTE 09/10/2022: EF 60-65%, mild LVH, mod BAE, mild MR, AoV sclerosis without stenosis   Aortic atherosclerosis    Atrial fibrillation (HCC)    a.) CHA2DS2VASc = 5 (age x2, HFpEF, HTN, NSTEMI);  b.) rate/rhythm maintained without  pharmacological intervention; chronically anticoagulated with apixaban    BPH (benign prostatic hyperplasia)    Bradycardia    Cardiomegaly    COPD (chronic obstructive pulmonary disease) (HCC)    Coronary artery disease 05/03/2012   a.) LHC 05/03/2012:  EF 56%, apical dyskinesis, 99% mLAD, 70% D1-1, 75% D1-2, 40% mLCx, 50% pRCA, 99% dRCA, 99% RDPA --> consult CVTS; b.) 2v MIDCAB at [facility] (LIMA-LAD, SVG-PDA)   DDD (degenerative disc disease), lumbar    Diverticulosis    DOE (dyspnea on exertion)    Elevated PSA    Erectile dysfunction    a.) on PDE5i (sildenafil) PRN   GI bleed 09/29/2019   History of kidney stones    History of tobacco abuse    a.) quit 2015   Hyperlipidemia    Hypertension    Long term current use of anticoagulant    a.) apixaban    NSTEMI (non-ST elevated myocardial infarction) (HCC) 05/02/2012   a.) NSTEMI 05/02/2012 --> LHC 05/03/2012 at [facility] --> EF 56%, apical dyskinesis, 99% mLAD, 70% D1-1, 75% D1-2, 40% mLCx, 50% pRCA, 99% dRCA, 99% RDPA --> consult CVTS; b.) 2v MIDCAB at [facility] (LIMA-LAD, SVG-PDA)   Osteoarthritis    Peripheral edema    Pneumonia    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   a.) 2v MIDCAB --> LIMA-LAD, SVG-PDA   Squamous cell carcinoma of skin 11/21/2021   L dorsum hand - ED&C   Squamous cell carcinoma of skin 11/21/2021   L dorsum forearm - ED&C  Thoracic ascending aortic aneurysm 06/16/2020   a.) CT chest 06/16/2020: measured 4.6 x 4.0 cm; b.) CT chest 04/26/2022: measured 4.1 cm; c.) CTA chest 09/08/2022: measured 4.4 cm    Surgical History: Past Surgical History:  Procedure Laterality Date   BYPASS AXILLA/BRACHIAL ARTERY     CHOLECYSTECTOMY     COLONOSCOPY WITH PROPOFOL  N/A 10/02/2019   Procedure: COLONOSCOPY WITH PROPOFOL ;  Surgeon: Toledo, Ladell POUR, MD;  Location: [facility] ENDOSCOPY;  Service: Gastroenterology;  Laterality: N/A;   MINIMALLY INVASIVE DIRECT CORONARY ARTERY BYPASS (MIDCAB) Left 05/13/2012   Procedure: MINIMALLY INVASIVE DIRECT  CORONARY ARTERY BYPASS (2v MIDCAB); Location: [facility]   PROSTATE BIOPSY N/A 10/19/2022   Procedure: PROSTATE BIOPSY GRAYCE;  Surgeon: Kassie Ozell SAUNDERS, MD;  Location: [facility] ORS;  Service: Urology;  Laterality: N/A;   TOTAL HIP ARTHROPLASTY Left 2006    Home Medications:  Allergies as of 09/19/2024   No Known Allergies      Medication List        Accurate as of September 18, 2024  9:41 PM. If you have any questions, ask your nurse or doctor.          albuterol  108 (90 Base) MCG/ACT inhaler Commonly known as: VENTOLIN  HFA Inhale 2 puffs into the lungs every 6 (six) hours as needed.   aspirin  81 MG chewable tablet Chew 81 mg by mouth daily.   atorvastatin  10 MG tablet Commonly known as: LIPITOR Take 10 mg by mouth daily.   dapagliflozin  propanediol 10 MG Tabs tablet Commonly known as: FARXIGA  Take 1 tablet (10 mg total) by mouth daily.   docusate sodium  100 MG capsule Commonly known as: COLACE Take 2 capsules (200 mg total) by mouth 2 (two) times daily.   dutasteride 0.5 MG capsule Commonly known as: AVODART Take 0.5 mg by mouth daily.   Eliquis  5 MG Tabs tablet Generic drug: apixaban  Take 5 mg by mouth every 12 (twelve) hours.   Entresto  24-26 MG Generic drug: sacubitril-valsartan Take 1 tablet by mouth 2 (two) times daily.   fluticasone  50 MCG/ACT nasal spray Commonly known as: FLONASE  Place 2 sprays into the nose daily as needed.   furosemide  40 MG tablet Commonly known as: LASIX  Take 20 mg by mouth 2 (two) times daily.   hydroxyurea 500 MG capsule Commonly known as: HYDREA Take 500 mg by mouth daily. May take with food to minimize GI side effects.   ipratropium-albuterol  0.5-2.5 (3) MG/3ML Soln Commonly known as: DUONEB Take 3 mLs by nebulization every 6 (six) hours as needed (shortness of breath, wheezing.).   metolazone  2.5 MG tablet Commonly known as: ZAROXOLYN  Take 1 tablet (2.5 mg total) by mouth as needed.   montelukast  10 MG tablet Commonly  known as: SINGULAIR  Take 10 mg by mouth at bedtime.   potassium chloride  SA 20 MEQ tablet Commonly known as: KLOR-CON  M Take 1 tablet (20 mEq total) by mouth as needed. Take this if you take metolazone    PreserVision AREDS Caps Take 1 capsule by mouth 2 (two) times daily.   spironolactone  25 MG tablet Commonly known as: ALDACTONE  TAKE 1 TABLET BY MOUTH DAILY   tamsulosin  0.4 MG Caps capsule Commonly known as: FLOMAX  Take 0.8 mg by mouth daily. Take 1 capsule (0.4 mg total) by mouth once daily Take 30 minutes after same meal each day.   Trelegy Ellipta 100-62.5-25 MCG/ACT Aepb Generic drug: Fluticasone -Umeclidin-Vilant Inhale 1 puff into the lungs daily.        Allergies: No Known Allergies  Family  History: Family History  Problem Relation Age of Onset   Breast cancer Mother    Heart attack Father     Social History:  reports that he quit smoking about 10 years ago. His smoking use included cigarettes. He has never used smokeless tobacco. He reports current alcohol use of about 5.0 standard drinks of alcohol per week. He reports that he does not use drugs.  ROS: Pertinent ROS in HPI  Physical Exam: There were no vitals taken for this visit.  Constitutional:  Well nourished. Alert and oriented, No acute distress. HEENT: [place] AT, moist mucus membranes.  Trachea midline, no masses. Cardiovascular: No clubbing, cyanosis, or edema. Respiratory: Normal respiratory effort, no increased work of breathing. GI: Abdomen is soft, non tender, non distended, no abdominal masses. Liver and spleen not palpable.  No hernias appreciated.  Stool sample for occult testing is not indicated.   GU: No CVA tenderness.  No bladder fullness or masses.  Patient with circumcised/uncircumcised phallus. ***Foreskin easily retracted***  Urethral meatus is patent.  No penile discharge. No penile lesions or rashes. Scrotum without lesions, cysts, rashes and/or edema.  Testicles are located scrotally  bilaterally. No masses are appreciated in the testicles. Left and right epididymis are normal. Rectal: Patient with  normal sphincter tone. Anus and perineum without scarring or rashes. No rectal masses are appreciated. Prostate is approximately *** grams, *** nodules are appreciated. Seminal vesicles are normal. Skin: No rashes, bruises or suspicious lesions. Lymph: No cervical or inguinal adenopathy. Neurologic: Grossly intact, no focal deficits, moving all 4 extremities. Psychiatric: Normal mood and affect.  Laboratory Data: See Epic and HPI I have reviewed the labs.  See HPI.    Pertinent Imaging: ***  Assessment & Plan:  ***  1. BPH with LU TS - stable, improving, worsening mild, moderate severe symptoms *** - no signs of retention, infection or malignancy *** - PSA up to date *** - DRE benign *** - UA benign *** - PVR < 300 cc *** - most bothersome symptoms are *** - encouraged avoiding bladder irritants, fluid restriction before bedtime and timed voiding's - Initiate alpha-blocker (***), discussed side effects *** - Initiate 5 alpha reductase inhibitor (***), discussed side effects *** - Continue tamsulosin  0.4 mg daily and dutasteride 0.5 mg daily: refills given - Cannot tolerate medication or medication failure, schedule cystoscopy *** - educated on red flag symptoms: acute retention, gross hematuria, fever, severe pain - advised to call clinic or go to the ED if these occur - return to clinic in *** symptom re-evaluation ***  2. Suspected UTI  - UA grossly infected  - Urine culture pending - Started empirically on ***, will adjust if necessary once urine culture and sensitivity results are available  - Advised patient to increase fluid intake and monitor symptoms - Counseled on UTI prevention (hydration, post-coital voiding, wiping from to back) *** - follow-up or call if no improvement within 48-72 hours or if symptoms worsen (fever, back pain)  -Ceftin  500 mg  twice daily for seven days *** -Ceftin  250 mg twice daily for seven days *** -Septra DS twice daily for seven days *** -Augmentin 875/125 twice daily for seven days *** -Macrobid 100 mg twice daily for seven days *** -Doxycycline 100 mg twice daily for seven days *** -Omnicef  300 mg twice daily for seven days ***       No follow-ups on file.  These notes generated with voice recognition software. I apologize for typographical errors.  Daxton Nydam,  PA-C  [facility] [address]  [address] [place], [place] [address] [phone]

## 2024-09-18 NOTE — Telephone Encounter (Signed)
 Patient called with burning with urination, frequency and urgency and some pain in bladder while urinating but then it eases off. He states he has to go every 30 minutes or so. Pt states no fevers or pain in back or flank. Pt was agreeable to a appointment tomorrow. Pt also understood to go to urgent care or the ER if he has worsening symptoms or starts running a fever.

## 2024-09-19 ENCOUNTER — Ambulatory Visit (INDEPENDENT_AMBULATORY_CARE_PROVIDER_SITE_OTHER): Admitting: Urology

## 2024-09-19 ENCOUNTER — Encounter: Payer: Self-pay | Admitting: Urology

## 2024-09-19 VITALS — BP 156/50 | HR 47 | Ht 67.0 in | Wt 202.0 lb

## 2024-09-19 DIAGNOSIS — R3989 Other symptoms and signs involving the genitourinary system: Secondary | ICD-10-CM | POA: Diagnosis not present

## 2024-09-19 DIAGNOSIS — N401 Enlarged prostate with lower urinary tract symptoms: Secondary | ICD-10-CM | POA: Diagnosis not present

## 2024-09-19 LAB — URINALYSIS, COMPLETE
Bilirubin, UA: NEGATIVE
Ketones, UA: NEGATIVE
Nitrite, UA: NEGATIVE
Specific Gravity, UA: 1.015 (ref 1.005–1.030)
Urobilinogen, Ur: 0.2 mg/dL (ref 0.2–1.0)
pH, UA: 6 (ref 5.0–7.5)

## 2024-09-19 LAB — MICROSCOPIC EXAMINATION: WBC, UA: >30 /HPF — AB (ref 0–5)

## 2024-09-19 LAB — BLADDER SCAN AMB NON-IMAGING

## 2024-09-19 MED ORDER — CEFPODOXIME PROXETIL 200 MG PO TABS: 200.0000 mg | ORAL_TABLET | Freq: Two times a day (BID) | ORAL | 0 refills | Status: AC

## 2024-09-26 ENCOUNTER — Ambulatory Visit: Payer: Self-pay | Admitting: Urology

## 2024-09-26 DIAGNOSIS — R3989 Other symptoms and signs involving the genitourinary system: Secondary | ICD-10-CM

## 2024-09-26 LAB — CULTURE, URINE COMPREHENSIVE

## 2024-09-30 MED ORDER — CIPROFLOXACIN HCL 250 MG PO TABS: 250.0000 mg | ORAL_TABLET | Freq: Two times a day (BID) | ORAL | 0 refills | Status: AC

## 2024-11-18 NOTE — Progress Notes (Unsigned)
 11/25/2024 12:16 PM   [name] [birth year] [id number]  Referring provider: Diedra Lame, MD [phone] [name] [place],  [place] [address]  Urological history: 1. Elevated PSA - PSA (2021) 10.10 - fusion biopsy (2023) negative   2. BPH with LU TS - prostate MRI (2023) volume 123 cc - tamsulosin /dutasteride    No chief complaint on file.  HPI: David Hardin is a 89 y.o. man who presents today for two months follow up.    Previous records reviewed.  I PSS ***  He reports sensation of incomplete bladder emptying,   urinary frequency,   urinary intermittency,   urinary urgency,   a weak urinary stream,   having to strain to void,   nocturia x ***,   leaking before being able to reach the restroom,   leaking with coughing,   leaking without awareness,   and post void dribbling.     He is wearing *** pads//depends  daily.    Patient denies any modifying or aggravating factors.  Patient denies any recent UTI's, gross hematuria, dysuria or suprapubic/flank pain.  Patient denies any fevers, chills, nausea or vomiting.  ***  UA***  Serum creatinine (11/2024) 1.2   Hemoglobin A1c (07/2024) 5.4  Diuretics: Spironolactone   BPH meds: Dutasteride 0.5 mg daily and tamsulosin  0.8 mg daily   PMH: Past Medical History:  Diagnosis Date   (HFpEF) heart failure with preserved ejection fraction (HCC) 05/03/2012   a.) TTE 05/03/2012: EF 50-55%, apical AK, mild AR; b.) TTE 05/11/2022: EF >55%, MAC, mod BAE, mod RVE, triv AR/PR, mild MR/TR; c.) TTE 09/10/2022: EF 60-65%, mild LVH, mod BAE, mild MR, AoV sclerosis without stenosis   Aortic atherosclerosis    Atrial fibrillation (HCC)    a.) CHA2DS2VASc = 5 (age x2, HFpEF, HTN, NSTEMI);  b.) rate/rhythm maintained without pharmacological intervention; chronically anticoagulated with apixaban    BPH (benign prostatic hyperplasia)    Bradycardia    Cardiomegaly     COPD (chronic obstructive pulmonary disease) (HCC)    Coronary artery disease 05/03/2012   a.) LHC 05/03/2012:  EF 56%, apical dyskinesis, 99% mLAD, 70% D1-1, 75% D1-2, 40% mLCx, 50% pRCA, 99% dRCA, 99% RDPA --> consult CVTS; b.) 2v MIDCAB at [facility] (LIMA-LAD, SVG-PDA)   DDD (degenerative disc disease), lumbar    Diverticulosis    DOE (dyspnea on exertion)    Elevated PSA    Erectile dysfunction    a.) on PDE5i (sildenafil) PRN   GI bleed 09/29/2019   History of kidney stones    History of tobacco abuse    a.) quit 2015   Hyperlipidemia    Hypertension    Long term current use of anticoagulant    a.) apixaban    NSTEMI (non-ST elevated myocardial infarction) (HCC) 05/02/2012   a.) NSTEMI 05/02/2012 --> LHC 05/03/2012 at [facility] --> EF 56%, apical dyskinesis, 99% mLAD, 70% D1-1, 75% D1-2, 40% mLCx, 50% pRCA, 99% dRCA, 99% RDPA --> consult CVTS; b.) 2v MIDCAB at [facility] (LIMA-LAD, SVG-PDA)   Osteoarthritis    Peripheral edema    Pneumonia    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   a.) 2v MIDCAB --> LIMA-LAD, SVG-PDA   Squamous cell carcinoma of skin 11/21/2021   L dorsum hand - ED&C   Squamous cell carcinoma of skin 11/21/2021   L dorsum forearm - ED&C   Thoracic ascending aortic aneurysm 06/16/2020   a.) CT chest 06/16/2020: measured 4.6 x 4.0 cm;  b.) CT chest 04/26/2022: measured 4.1 cm; c.) CTA chest 09/08/2022: measured 4.4 cm    Surgical History: Past Surgical History:  Procedure Laterality Date   BYPASS AXILLA/BRACHIAL ARTERY     CHOLECYSTECTOMY     COLONOSCOPY WITH PROPOFOL  N/A 10/02/2019   Procedure: COLONOSCOPY WITH PROPOFOL ;  Surgeon: Toledo, Ladell POUR, MD;  Location: [facility] ENDOSCOPY;  Service: Gastroenterology;  Laterality: N/A;   MINIMALLY INVASIVE DIRECT CORONARY ARTERY BYPASS (MIDCAB) Left 05/13/2012   Procedure: MINIMALLY INVASIVE DIRECT CORONARY ARTERY BYPASS (2v MIDCAB); Location: [facility]   PROSTATE BIOPSY N/A 10/19/2022   Procedure: PROSTATE BIOPSY GRAYCE;  Surgeon: Kassie Ozell SAUNDERS,  MD;  Location: [facility] ORS;  Service: Urology;  Laterality: N/A;   TOTAL HIP ARTHROPLASTY Left 2006    Home Medications:  Allergies as of 11/25/2024   No Known Allergies      Medication List        Accurate as of November 18, 2024 12:16 PM. If you have any questions, ask your nurse or doctor.          albuterol  108 (90 Base) MCG/ACT inhaler Commonly known as: VENTOLIN  HFA Inhale 2 puffs into the lungs every 6 (six) hours as needed.   aspirin  81 MG chewable tablet Chew 81 mg by mouth daily.   atorvastatin  10 MG tablet Commonly known as: LIPITOR Take 10 mg by mouth daily.   cefpodoxime  200 MG tablet Commonly known as: VANTIN  Take 1 tablet (200 mg total) by mouth 2 (two) times daily.   dapagliflozin  propanediol 10 MG Tabs tablet Commonly known as: FARXIGA  Take 1 tablet (10 mg total) by mouth daily.   docusate sodium  100 MG capsule Commonly known as: COLACE Take 2 capsules (200 mg total) by mouth 2 (two) times daily.   dutasteride 0.5 MG capsule Commonly known as: AVODART Take 0.5 mg by mouth daily.   Eliquis  5 MG Tabs tablet Generic drug: apixaban  Take 5 mg by mouth every 12 (twelve) hours.   Entresto  24-26 MG Generic drug: sacubitril-valsartan Take 1 tablet by mouth 2 (two) times daily.   fluticasone  50 MCG/ACT nasal spray Commonly known as: FLONASE  Place 2 sprays into the nose daily as needed.   furosemide  40 MG tablet Commonly known as: LASIX  Take 20 mg by mouth 2 (two) times daily.   hydroxyurea 500 MG capsule Commonly known as: HYDREA Take 500 mg by mouth daily. May take with food to minimize GI side effects.   ipratropium-albuterol  0.5-2.5 (3) MG/3ML Soln Commonly known as: DUONEB Take 3 mLs by nebulization every 6 (six) hours as needed (shortness of breath, wheezing.).   metolazone  2.5 MG tablet Commonly known as: ZAROXOLYN  Take 1 tablet (2.5 mg total) by mouth as needed.   montelukast  10 MG tablet Commonly known as: SINGULAIR  Take 10 mg by  mouth at bedtime.   potassium chloride  SA 20 MEQ tablet Commonly known as: KLOR-CON  M Take 1 tablet (20 mEq total) by mouth as needed. Take this if you take metolazone    PreserVision AREDS Caps Take 1 capsule by mouth 2 (two) times daily.   spironolactone  25 MG tablet Commonly known as: ALDACTONE  TAKE 1 TABLET BY MOUTH DAILY   tamsulosin  0.4 MG Caps capsule Commonly known as: FLOMAX  Take 0.8 mg by mouth daily. Take 1 capsule (0.4 mg total) by mouth once daily Take 30 minutes after same meal each day.   Trelegy Ellipta 100-62.5-25 MCG/ACT Aepb Generic drug: Fluticasone -Umeclidin-Vilant Inhale 1 puff into the lungs daily.        Allergies:  No Known Allergies  Family History: Family History  Problem Relation Age of Onset   Breast cancer Mother    Heart attack Father     Social History:  reports that he quit smoking about 10 years ago. His smoking use included cigarettes. He has never used smokeless tobacco. He reports current alcohol use of about 5.0 standard drinks of alcohol per week. He reports that he does not use drugs.  ROS: Pertinent ROS in HPI  Physical Exam: There were no vitals taken for this visit.  Constitutional:  Well nourished. Alert and oriented, No acute distress. HEENT: [place] AT, moist mucus membranes.  Trachea midline Cardiovascular: No clubbing, cyanosis, or edema. Respiratory: Normal respiratory effort, no increased work of breathing. Neurologic: Grossly intact, no focal deficits, moving all 4 extremities. Psychiatric: Normal mood and affect.   Laboratory Data: See Epic and HPI I have reviewed the labs.  See HPI.    Pertinent Imaging:  09/19/24 11:02  Scan Result 0ml    Assessment & Plan:    1. BPH with LU TS - mild, moderate severe symptoms *** and he is *** - no signs of retention, infection or malignancy *** - PSA up to date *** - DRE benign *** - UA benign *** - PVR < 300 cc *** - most bothersome symptoms are *** - encouraged  avoiding bladder irritants, fluid restriction before bedtime and timed voiding's - Initiate alpha-blocker (***), discussed side effects *** - Initiate 5 alpha reductase inhibitor (***), discussed side effects *** - Continue tamsulosin  0.4 mg daily, alfuzosin 10 mg daily, Rapaflo 8 mg daily, terazosin, doxazosin, Cialis 5 mg daily and finasteride 5 mg daily, dutasteride 0.5 mg daily***:refills given - Cannot tolerate medication or medication failure, schedule cystoscopy *** - educated on red flag symptoms: acute retention, gross hematuria, fever, severe pain - advised to call clinic or go to the ED if these occur - return to clinic in *** symptom re-evaluation ***   2. Microscopic hematuria - UA ***  No follow-ups on file.  These notes generated with voice recognition software. I apologize for typographical errors.  CLOTILDA HELON RIGGERS  [facility] [address]  [address] [place], [place] [address] [phone]

## 2024-11-19 ENCOUNTER — Ambulatory Visit: Admitting: Urology

## 2024-11-25 ENCOUNTER — Ambulatory Visit: Admitting: Urology

## 2024-11-25 ENCOUNTER — Encounter: Payer: Self-pay | Admitting: Urology

## 2024-11-25 VITALS — BP 132/69 | HR 55 | Wt 201.0 lb

## 2024-11-25 DIAGNOSIS — N401 Enlarged prostate with lower urinary tract symptoms: Secondary | ICD-10-CM

## 2024-11-25 DIAGNOSIS — R3129 Other microscopic hematuria: Secondary | ICD-10-CM

## 2024-11-25 LAB — URINALYSIS, COMPLETE
Bilirubin, UA: NEGATIVE
Glucose, UA: NEGATIVE
Ketones, UA: NEGATIVE
Leukocytes,UA: NEGATIVE
Nitrite, UA: NEGATIVE
Protein,UA: NEGATIVE
Specific Gravity, UA: 1.015 (ref 1.005–1.030)
Urobilinogen, Ur: 0.2 mg/dL (ref 0.2–1.0)
pH, UA: 5.5 (ref 5.0–7.5)

## 2024-11-25 LAB — MICROSCOPIC EXAMINATION

## 2024-12-30 ENCOUNTER — Encounter: Payer: Self-pay | Admitting: Dermatology

## 2024-12-30 ENCOUNTER — Ambulatory Visit: Payer: Medicare HMO | Admitting: Dermatology

## 2024-12-30 DIAGNOSIS — L820 Inflamed seborrheic keratosis: Secondary | ICD-10-CM

## 2024-12-30 DIAGNOSIS — D692 Other nonthrombocytopenic purpura: Secondary | ICD-10-CM | POA: Diagnosis not present

## 2024-12-30 DIAGNOSIS — Z85828 Personal history of other malignant neoplasm of skin: Secondary | ICD-10-CM

## 2024-12-30 DIAGNOSIS — L578 Other skin changes due to chronic exposure to nonionizing radiation: Secondary | ICD-10-CM | POA: Diagnosis not present

## 2024-12-30 DIAGNOSIS — Z1283 Encounter for screening for malignant neoplasm of skin: Secondary | ICD-10-CM

## 2024-12-30 DIAGNOSIS — L570 Actinic keratosis: Secondary | ICD-10-CM

## 2024-12-30 DIAGNOSIS — Z8589 Personal history of malignant neoplasm of other organs and systems: Secondary | ICD-10-CM

## 2024-12-30 DIAGNOSIS — D229 Melanocytic nevi, unspecified: Secondary | ICD-10-CM

## 2024-12-30 DIAGNOSIS — L814 Other melanin hyperpigmentation: Secondary | ICD-10-CM | POA: Diagnosis not present

## 2024-12-30 DIAGNOSIS — W908XXA Exposure to other nonionizing radiation, initial encounter: Secondary | ICD-10-CM | POA: Diagnosis not present

## 2024-12-30 DIAGNOSIS — L821 Other seborrheic keratosis: Secondary | ICD-10-CM

## 2024-12-30 NOTE — Patient Instructions (Addendum)

## 2024-12-30 NOTE — Progress Notes (Signed)
 "  Follow-Up Visit   Subjective  David Hardin is a 89 y.o. male who presents for the following: Patient declined TBSE, wants to check arms, face, hx of SCC, hx of AKs  The patient presents for Upper Body Skin Exam (UBSE) for skin cancer screening and mole check.  The patient has spots, moles and lesions to be evaluated, some may be new or changing and the patient has concerns that these could be cancer.   The following portions of the chart were reviewed this encounter and updated as appropriate: medications, allergies, medical history  Review of Systems:  No other skin or systemic complaints except as noted in HPI or Assessment and Plan.  Objective  Well appearing patient in no apparent distress; mood and affect are within normal limits.   A focused examination was performed of the following areas: Face, ears, arms, trunk; Upper body skin exam.  Relevant exam findings are noted in the Assessment and Plan.  face, ears x 10 (10) Pink scaly macules arms, hands x 8 (8) Stuck on waxy paps with erythema R lower eyelid margin x 1 Pink scaly macule  Assessment & Plan   ACTINIC DAMAGE - chronic, secondary to cumulative UV radiation exposure/sun exposure over time - diffuse scaly erythematous macules with underlying dyspigmentation - Recommend daily broad spectrum sunscreen SPF 30+ to sun-exposed areas, reapply every 2 hours as needed.  - Recommend staying in the shade or wearing long sleeves, sun glasses (UVA+UVB protection) and wide brim hats (4-inch brim around the entire circumference of the hat). - Call for new or changing lesions.   HISTORY OF SQUAMOUS CELL CARCINOMA OF THE SKIN - No evidence of recurrence today - No lymphadenopathy - Recommend regular full body skin exams - Recommend daily broad spectrum sunscreen SPF 30+ to sun-exposed areas, reapply every 2 hours as needed.  - Call if any new or changing lesions are noted between office visits - L dorsum hand, L dorsum  forearm  Purpura - Chronic; persistent and recurrent.  Treatable, but not curable. - Violaceous macules and patches - Benign - Related to trauma, age, sun damage and/or use of blood thinners, chronic use of topical and/or oral steroids - Observe - Can use OTC arnica containing moisturizer such as Dermend Bruise Formula if desired - Call for worsening or other concerns   SEBORRHEIC KERATOSIS - Stuck-on, waxy, tan-brown papules and/or plaques  - Benign-appearing - Discussed benign etiology and prognosis. - Observe - Call for any changes  LENTIGINES Exam: scattered tan macules Due to sun exposure Treatment Plan: Benign-appearing, observe. Recommend daily broad spectrum sunscreen SPF 30+ to sun-exposed areas, reapply every 2 hours as needed.  Call for any changes    AK (ACTINIC KERATOSIS) (10) face, ears x 10 (10) Actinic keratoses are precancerous spots that appear secondary to cumulative UV radiation exposure/sun exposure over time. They are chronic with expected duration over 1 year. A portion of actinic keratoses will progress to squamous cell carcinoma of the skin. It is not possible to reliably predict which spots will progress to skin cancer and so treatment is recommended to prevent development of skin cancer.  Recommend daily broad spectrum sunscreen SPF 30+ to sun-exposed areas, reapply every 2 hours as needed.  Recommend staying in the shade or wearing long sleeves, sun glasses (UVA+UVB protection) and wide brim hats (4-inch brim around the entire circumference of the hat). Call for new or changing lesions. - Destruction of lesion - face, ears x 10 (10) Complexity: simple  Destruction method: cryotherapy   Informed consent: discussed and consent obtained   Timeout:  patient name, date of birth, surgical site, and procedure verified Lesion destroyed using liquid nitrogen: Yes   Region frozen until ice ball extended beyond lesion: Yes   Outcome: patient tolerated  procedure well with no complications   Post-procedure details: wound care instructions given    INFLAMED SEBORRHEIC KERATOSIS (8) arms, hands x 8 (8) Symptomatic, irritating, patient would like treated. - Destruction of lesion - arms, hands x 8 (8) Complexity: simple   Destruction method: cryotherapy   Informed consent: discussed and consent obtained   Timeout:  patient name, date of birth, surgical site, and procedure verified Lesion destroyed using liquid nitrogen: Yes   Region frozen until ice ball extended beyond lesion: Yes   Outcome: patient tolerated procedure well with no complications   Post-procedure details: wound care instructions given    KERATOSIS, ACTINIC R lower eyelid margin x 1 - Destruction of lesion - R lower eyelid margin x 1 Complexity: simple   Destruction method: cryotherapy   Informed consent: discussed and consent obtained   Timeout:  patient name, date of birth, surgical site, and procedure verified Lesion destroyed using liquid nitrogen: Yes   Region frozen until ice ball extended beyond lesion: Yes   Outcome: patient tolerated procedure well with no complications   Post-procedure details: wound care instructions given     Return in about 1 year (around 12/30/2025) for UBSE, Hx of SCC, Hx of AKs.  I, Grayce Saunas, RMA, am acting as scribe for Alm Rhyme, MD .   Documentation: I have reviewed the above documentation for accuracy and completeness, and I agree with the above.  Alm Rhyme, MD      "

## 2025-02-10 ENCOUNTER — Encounter: Admitting: Family

## 2025-12-31 ENCOUNTER — Ambulatory Visit: Admitting: Dermatology
